# Patient Record
Sex: Female | Born: 1945
Health system: Southern US, Community
[De-identification: ages and names within clinical notes are randomized; demographics above are authoritative.]

## PROBLEM LIST (undated history)

## (undated) DIAGNOSIS — A64 Unspecified sexually transmitted disease: Secondary | ICD-10-CM

## (undated) DIAGNOSIS — Z8639 Personal history of other endocrine, nutritional and metabolic disease: Secondary | ICD-10-CM

## (undated) DIAGNOSIS — N3281 Overactive bladder: Secondary | ICD-10-CM

## (undated) DIAGNOSIS — M8580 Other specified disorders of bone density and structure, unspecified site: Secondary | ICD-10-CM

## (undated) DIAGNOSIS — E785 Hyperlipidemia, unspecified: Secondary | ICD-10-CM

## (undated) DIAGNOSIS — K219 Gastro-esophageal reflux disease without esophagitis: Secondary | ICD-10-CM

## (undated) DIAGNOSIS — J329 Chronic sinusitis, unspecified: Secondary | ICD-10-CM

## (undated) DIAGNOSIS — M5136 Other intervertebral disc degeneration, lumbar region: Secondary | ICD-10-CM

## (undated) HISTORY — DX: Other intervertebral disc degeneration, lumbar region: M51.36

## (undated) HISTORY — PX: OTHER SURGICAL HISTORY: SHX169

## (undated) HISTORY — PX: TONSILLECTOMY: SUR1361

## (undated) HISTORY — PX: CATARACT EXTRACTION: SUR2

## (undated) HISTORY — DX: Personal history of other endocrine, nutritional and metabolic disease: Z86.39

## (undated) HISTORY — DX: Chronic sinusitis, unspecified: J32.9

## (undated) HISTORY — DX: Gastro-esophageal reflux disease without esophagitis: K21.9

## (undated) HISTORY — DX: Hyperlipidemia, unspecified: E78.5

## (undated) HISTORY — DX: Other specified disorders of bone density and structure, unspecified site: M85.80

## (undated) HISTORY — PX: TUBAL LIGATION: SHX77

## (undated) HISTORY — DX: Overactive bladder: N32.81

## (undated) HISTORY — DX: Unspecified sexually transmitted disease: A64

---

## 1987-10-23 HISTORY — PX: TUBAL LIGATION: SHX77

## 1987-10-23 HISTORY — PX: OTHER SURGICAL HISTORY: SHX169

## 1998-06-03 ENCOUNTER — Other Ambulatory Visit: Admission: RE | Admit: 1998-06-03 | Discharge: 1998-06-03 | Payer: Self-pay | Admitting: Obstetrics and Gynecology

## 1999-06-20 ENCOUNTER — Other Ambulatory Visit: Admission: RE | Admit: 1999-06-20 | Discharge: 1999-06-20 | Payer: Self-pay | Admitting: Obstetrics and Gynecology

## 2000-06-20 ENCOUNTER — Other Ambulatory Visit: Admission: RE | Admit: 2000-06-20 | Discharge: 2000-06-20 | Payer: Self-pay | Admitting: Obstetrics and Gynecology

## 2002-03-25 ENCOUNTER — Other Ambulatory Visit: Admission: RE | Admit: 2002-03-25 | Discharge: 2002-03-25 | Payer: Self-pay | Admitting: Obstetrics and Gynecology

## 2004-09-27 ENCOUNTER — Other Ambulatory Visit: Admission: RE | Admit: 2004-09-27 | Discharge: 2004-09-27 | Payer: Self-pay | Admitting: Obstetrics and Gynecology

## 2005-09-28 ENCOUNTER — Other Ambulatory Visit: Admission: RE | Admit: 2005-09-28 | Discharge: 2005-09-28 | Payer: Self-pay | Admitting: Obstetrics and Gynecology

## 2006-10-09 ENCOUNTER — Other Ambulatory Visit: Admission: RE | Admit: 2006-10-09 | Discharge: 2006-10-09 | Payer: Self-pay | Admitting: Obstetrics and Gynecology

## 2009-01-06 ENCOUNTER — Ambulatory Visit: Payer: Self-pay | Admitting: Obstetrics and Gynecology

## 2009-01-06 ENCOUNTER — Other Ambulatory Visit: Admission: RE | Admit: 2009-01-06 | Discharge: 2009-01-06 | Payer: Self-pay | Admitting: Obstetrics and Gynecology

## 2009-01-06 ENCOUNTER — Encounter: Payer: Self-pay | Admitting: Obstetrics and Gynecology

## 2009-01-11 ENCOUNTER — Ambulatory Visit: Payer: Self-pay | Admitting: Obstetrics and Gynecology

## 2010-01-11 ENCOUNTER — Ambulatory Visit: Payer: Self-pay | Admitting: Obstetrics and Gynecology

## 2010-01-11 ENCOUNTER — Other Ambulatory Visit: Admission: RE | Admit: 2010-01-11 | Discharge: 2010-01-11 | Payer: Self-pay | Admitting: Obstetrics and Gynecology

## 2010-08-23 ENCOUNTER — Ambulatory Visit: Payer: Self-pay | Admitting: Cardiology

## 2011-01-15 ENCOUNTER — Encounter (INDEPENDENT_AMBULATORY_CARE_PROVIDER_SITE_OTHER): Payer: Medicare Other | Admitting: Obstetrics and Gynecology

## 2011-01-15 DIAGNOSIS — A6004 Herpesviral vulvovaginitis: Secondary | ICD-10-CM

## 2011-01-15 DIAGNOSIS — R82998 Other abnormal findings in urine: Secondary | ICD-10-CM

## 2011-01-15 DIAGNOSIS — N952 Postmenopausal atrophic vaginitis: Secondary | ICD-10-CM

## 2011-01-15 DIAGNOSIS — M949 Disorder of cartilage, unspecified: Secondary | ICD-10-CM

## 2011-01-17 ENCOUNTER — Encounter (INDEPENDENT_AMBULATORY_CARE_PROVIDER_SITE_OTHER): Payer: Medicare Other

## 2011-01-17 DIAGNOSIS — M899 Disorder of bone, unspecified: Secondary | ICD-10-CM

## 2011-01-18 ENCOUNTER — Telehealth: Payer: Self-pay | Admitting: Cardiology

## 2011-01-18 NOTE — Telephone Encounter (Signed)
PT CAME BY THIS MORNING AND WANTED DR BB TO SEE HER FOR A COLD/FLU. STATED SHE HAD ALREADY BEEN TO THE [facility] DOWN [facility] [address] AND THEY WOULD NOT SEE HER. S/E MELINDA, ADVISED PT DR BB NOT IN AND SW HER ABOUT GOING TO THE URGENT CARE. PT CALLED THIS AFTERNOON AND INSISTED THAT IT WAS YESTERDAY WHEN SHE STOPPED BY AND DR BB WAS NOT IN AND WANTED TO KNOW IF HE WAS HERE TODAY. ADVISED HER SHE WAS JUST HERE THIS MORNING AND I HAD THE CONVERSATION WITH HER. PT STATED DID NOT GO TO THE URGENT CARE. SHE WENT HOME AND WENT TO BED AND WANTS TO KNOW IF SOMEONE  WILL CALL SOMETHING IN TO THE [facility]. CHART PLACED IN YOUR BOX.

## 2011-01-18 NOTE — Telephone Encounter (Signed)
Called patient back.  Patient stated she felt so bad she didn't think she could go to urgent cared, went home and went to bed.  Patient stated she had sudden onset of symptoms on Wednesday afternoon.  Advised she needed to go to urgent care to be tested.  In the mean time, ok to use tylenol/advil prn for fever, chills, and aches.  She stated her GYN started a new medication for urinary frequency, however she was not going to continue until felt better.

## 2011-01-19 NOTE — Telephone Encounter (Signed)
Agree with plan 

## 2011-01-30 ENCOUNTER — Other Ambulatory Visit: Payer: Self-pay | Admitting: *Deleted

## 2011-01-30 DIAGNOSIS — E785 Hyperlipidemia, unspecified: Secondary | ICD-10-CM

## 2011-01-30 MED ORDER — ATORVASTATIN CALCIUM 40 MG PO TABS: 40.0000 mg | ORAL_TABLET | Freq: Every day | ORAL | Status: DC

## 2011-02-02 ENCOUNTER — Other Ambulatory Visit: Payer: Medicare Other

## 2011-08-15 ENCOUNTER — Encounter: Payer: Self-pay | Admitting: *Deleted

## 2011-08-15 DIAGNOSIS — K219 Gastro-esophageal reflux disease without esophagitis: Secondary | ICD-10-CM | POA: Insufficient documentation

## 2011-08-15 DIAGNOSIS — J329 Chronic sinusitis, unspecified: Secondary | ICD-10-CM | POA: Insufficient documentation

## 2011-08-17 ENCOUNTER — Ambulatory Visit (INDEPENDENT_AMBULATORY_CARE_PROVIDER_SITE_OTHER): Payer: Medicare Other | Admitting: *Deleted

## 2011-08-17 DIAGNOSIS — E7800 Pure hypercholesterolemia, unspecified: Secondary | ICD-10-CM

## 2011-08-17 LAB — HEPATIC FUNCTION PANEL
AST: 21 U/L (ref 0–37)
Albumin: 3.8 g/dL (ref 3.5–5.2)
Alkaline Phosphatase: 85 U/L (ref 39–117)
Bilirubin, Direct: 0.1 mg/dL (ref 0.0–0.3)
Total Protein: 6.9 g/dL (ref 6.0–8.3)

## 2011-08-17 LAB — BASIC METABOLIC PANEL
CO2: 30 mEq/L (ref 19–32)
Chloride: 106 mEq/L (ref 96–112)
Potassium: 4.2 mEq/L (ref 3.5–5.1)
Sodium: 141 mEq/L (ref 135–145)

## 2011-08-17 LAB — LIPID PANEL: Total CHOL/HDL Ratio: 2

## 2011-08-20 ENCOUNTER — Ambulatory Visit (INDEPENDENT_AMBULATORY_CARE_PROVIDER_SITE_OTHER): Payer: Medicare Other | Admitting: Cardiology

## 2011-08-20 ENCOUNTER — Encounter: Payer: Self-pay | Admitting: Cardiology

## 2011-08-20 VITALS — BP 120/70 | HR 78 | Ht 64.0 in | Wt 157.0 lb

## 2011-08-20 DIAGNOSIS — E7800 Pure hypercholesterolemia, unspecified: Secondary | ICD-10-CM

## 2011-08-20 DIAGNOSIS — K219 Gastro-esophageal reflux disease without esophagitis: Secondary | ICD-10-CM

## 2011-08-20 DIAGNOSIS — J329 Chronic sinusitis, unspecified: Secondary | ICD-10-CM

## 2011-08-20 NOTE — Progress Notes (Signed)
Sharon Robinson Date of Birth:  06/11/1946 [facility] / [facility] [address]   [address] [place], [place]  [address] [phone]           Fax   [phone]  HPI: This pleasant 65 year old woman is seen for a one-year followup office visit.  She has a past history of hypercholesterolemia.  His last visit.  She has been feeling well.  She continues on Lipitor 40 mg daily.  She's not having myalgias or side effects from the Lipitor.  She does not have any history of coronary disease.  She had a normal stress Cardiolite in 2007  Current Outpatient Prescriptions  Medication Sig Dispense Refill  . atorvastatin (LIPITOR) 40 MG tablet Take 1 tablet (40 mg total) by mouth daily.  30 tablet  11  . GuaiFENesin (MUCINEX PO) Take by mouth. Takes prn       . montelukast (SINGULAIR) 10 MG tablet Take 10 mg by mouth at bedtime. As directed       . ValACYclovir HCl (VALTREX PO) Take by mouth daily. 500 daily        No Known Allergies  Patient Active Problem List  Diagnoses  . Sinusitis  . GERD (gastroesophageal reflux disease)    History  Smoking status  . Current Everyday Smoker -- 0.5 packs/day  . Types: Cigarettes  Smokeless tobacco  . Not on file    History  Alcohol Use     Family History  Problem Relation Age of Onset  . Heart attack Mother 31  . Cancer Father 27    colon    Review of Systems: The patient denies any heat or cold intolerance.  No weight gain or weight loss.  The patient denies headaches or blurry vision.  There is no cough or sputum production.  The patient denies dizziness.  There is no hematuria or hematochezia.  The patient denies any muscle aches or arthritis.  The patient denies any rash.  The patient denies frequent falling or instability.  There is no history of depression or anxiety.  All other systems were reviewed and are negative.   Physical Exam: Filed Vitals:   08/20/11 1627  BP: 120/70  Pulse: 78   general appearance  reveals a well-developed, well-nourished woman in no distress.The head and neck exam reveals pupils equal and reactive.  Extraocular movements are full.  There is no scleral icterus.  The mouth and pharynx are normal.  The neck is supple.  The carotids reveal no bruits.  The jugular venous pressure is normal.  The  thyroid is not enlarged.  There is no lymphadenopathy.  The chest is clear to percussion and auscultation.  There are no rales or rhonchi.  Expansion of the chest is symmetrical.  The precordium is quiet.  The first heart sound is normal.  The second heart sound is physiologically split.  There is no murmur gallop rub or click.  There is no abnormal lift or heave.  The abdomen is soft and nontender.  The bowel sounds are normal.  The liver and spleen are not enlarged.  There are no abdominal masses.  There are no abdominal bruits.  Extremities reveal good pedal pulses.  There is no phlebitis or edema.  There is no cyanosis or clubbing.  Strength is normal and symmetrical in all extremities.  There is no lateralizing weakness.  There are no sensory deficits.  The skin is warm and dry.  There is no rash.  Assessment / Plan:  Continue same medication.  Recheck in one year for office visit and fasting lab work.

## 2011-08-20 NOTE — Patient Instructions (Addendum)
Your physician wants you to follow-up in: 1 year  You will receive a reminder letter in the mail two months in advance. If you don't receive a letter, please call our office to schedule the follow-up appointment.  Your physician recommends that you continue on your current medications as directed. Please refer to the Current Medication list given to you today.  

## 2011-08-20 NOTE — Assessment & Plan Note (Signed)
No recent symptoms of GERD or dyspepsia

## 2011-08-20 NOTE — Assessment & Plan Note (Signed)
I reviewed her recent blood work, which is excellent.  She's not having any side effects from statin therapy.  She will continue same dose of Lipitor

## 2011-08-20 NOTE — Assessment & Plan Note (Signed)
She has had a head cold for the past several weeks.  She has been taking Mucinex once a day.  She's not having any purulent sputum or fever.  She does not need any antibiotic at this time.

## 2011-09-17 ENCOUNTER — Telehealth: Payer: Self-pay | Admitting: Cardiology

## 2011-09-17 DIAGNOSIS — E785 Hyperlipidemia, unspecified: Secondary | ICD-10-CM

## 2011-09-17 MED ORDER — ATORVASTATIN CALCIUM 40 MG PO TABS: 40.0000 mg | ORAL_TABLET | Freq: Every day | ORAL | Status: DC

## 2011-09-17 NOTE — Telephone Encounter (Signed)
Advised ok to change to generic Lipitor and sent Rx to [facility].  Do you want to check her labs before September?

## 2011-09-17 NOTE — Telephone Encounter (Signed)
New Msg: Pt calling regarding increase price in lipitor according to pt insurance. [place]ther substitutes for Lipitor are crestor or atorvastatin.  Alternate number: [phone]

## 2011-09-17 NOTE — Telephone Encounter (Signed)
Not necessary to recheck her labs before September since she is still basically on the same medication except now generic.

## 2011-12-26 ENCOUNTER — Encounter: Payer: Self-pay | Admitting: Cardiology

## 2011-12-27 ENCOUNTER — Encounter: Payer: Self-pay | Admitting: Obstetrics and Gynecology

## 2012-01-15 ENCOUNTER — Encounter: Payer: Self-pay | Admitting: Gynecology

## 2012-01-15 DIAGNOSIS — M8580 Other specified disorders of bone density and structure, unspecified site: Secondary | ICD-10-CM | POA: Insufficient documentation

## 2012-01-15 DIAGNOSIS — N3281 Overactive bladder: Secondary | ICD-10-CM | POA: Insufficient documentation

## 2012-01-15 DIAGNOSIS — A64 Unspecified sexually transmitted disease: Secondary | ICD-10-CM | POA: Insufficient documentation

## 2012-01-16 ENCOUNTER — Encounter: Payer: Medicare Other | Admitting: Obstetrics and Gynecology

## 2012-01-23 ENCOUNTER — Other Ambulatory Visit (HOSPITAL_COMMUNITY)
Admission: RE | Admit: 2012-01-23 | Discharge: 2012-01-23 | Disposition: A | Discharge status: Home / Self Care | Payer: Medicare Other | Source: Ambulatory Visit | Attending: Obstetrics and Gynecology | Admitting: Obstetrics and Gynecology

## 2012-01-23 ENCOUNTER — Encounter: Payer: Self-pay | Admitting: Obstetrics and Gynecology

## 2012-01-23 ENCOUNTER — Ambulatory Visit (INDEPENDENT_AMBULATORY_CARE_PROVIDER_SITE_OTHER): Payer: Medicare Other | Admitting: Obstetrics and Gynecology

## 2012-01-23 VITALS — BP 120/66 | Ht 64.5 in | Wt 161.0 lb

## 2012-01-23 DIAGNOSIS — Z124 Encounter for screening for malignant neoplasm of cervix: Secondary | ICD-10-CM

## 2012-01-23 DIAGNOSIS — M8580 Other specified disorders of bone density and structure, unspecified site: Secondary | ICD-10-CM

## 2012-01-23 DIAGNOSIS — B009 Herpesviral infection, unspecified: Secondary | ICD-10-CM

## 2012-01-23 DIAGNOSIS — M949 Disorder of cartilage, unspecified: Secondary | ICD-10-CM

## 2012-01-23 DIAGNOSIS — R350 Frequency of micturition: Secondary | ICD-10-CM

## 2012-01-23 DIAGNOSIS — R3915 Urgency of urination: Secondary | ICD-10-CM

## 2012-01-23 DIAGNOSIS — Z01419 Encounter for gynecological examination (general) (routine) without abnormal findings: Secondary | ICD-10-CM | POA: Insufficient documentation

## 2012-01-23 MED ORDER — VALACYCLOVIR HCL 500 MG PO TABS: 500.0000 mg | ORAL_TABLET | Freq: Every day | ORAL | Status: AC

## 2012-01-23 NOTE — Progress Notes (Signed)
Patient came to see me today for further followup. The first thing we discussed with her osteopenia. We had  asked her to return to discuss this previously but she had not. She had lost additional bone in both her spine and hip. Her 10 year fracture risk at the hip was borderline at 2.9%. She is not taking calcium or vitamin D. She previously taken D for a low D level. She is still smoker although she says she does not inhale. She is exercising. She does have atrophic vaginitis but is not bothered by it and is not sexually active. She is having no vaginal bleeding and no pelvic pain. She is taking Valtrex daily and since she started she has had no herpetic reoccurrences. We gave her samples last year of Toviaz but she had side effects and stopped them. She feels she's fine without a different medication. She sees Dr. Patty Sermons who does her lab work. She has had no fractures and is taken several falls. Patient had a normal colonoscopy several years ago.  ROS: 12 system review done. Pertinent positives above.  Physical examination:  Kim gardner present HEENT within normal limits. Neck: Thyroid not large. No masses. Supraclavicular nodes: not enlarged. Breasts: Examined in both sitting and lying  position. No skin changes and no masses. Abdomen: Soft no guarding rebound or masses or hernia. Pelvic: External: Within normal limits. BUS: Within normal limits. Vaginal:within normal limits. Poor  estrogen effect. No evidence of cystocele rectocele or enterocele. Cervix: clean. Uterus: Normal size and shape. Adnexa: No masses. Rectovaginal exam: Confirmatory and negative. Extremities: Within normal limits.  Assessment: #1. Osteopenia #2. Detrussor instability #3. HSV #4. Atrophic vaginitis  Plan: We discussed medication for her osteopenia. She declined. She will make sure she's getting adequate calcium in her diet. We rechecked a vitamin D level. I told her I suspected she would need medication. She will  continue her Valtrex 500 mg daily. We will continue to watch her detrussor instability and atrophic vaginitis.

## 2012-01-24 ENCOUNTER — Other Ambulatory Visit: Payer: Self-pay

## 2012-01-29 ENCOUNTER — Telehealth: Payer: Self-pay

## 2012-01-29 DIAGNOSIS — R350 Frequency of micturition: Secondary | ICD-10-CM

## 2012-01-29 NOTE — Telephone Encounter (Signed)
PT. NOTIFIED TO RETURN TO LEAVE A URINE AGAIN DUE ALL URINE LEAKED OUT IN TRANSIT TO LAB. NO CHARGE PER PAT.

## 2012-01-30 ENCOUNTER — Other Ambulatory Visit: Payer: Medicare Other

## 2012-01-30 DIAGNOSIS — R350 Frequency of micturition: Secondary | ICD-10-CM

## 2012-01-31 LAB — URINALYSIS W MICROSCOPIC + REFLEX CULTURE
Bilirubin Urine: NEGATIVE
Crystals: NONE SEEN
Nitrite: NEGATIVE
Specific Gravity, Urine: 1.021 (ref 1.005–1.030)
Squamous Epithelial / LPF: NONE SEEN
Urobilinogen, UA: 0.2 mg/dL (ref 0.0–1.0)

## 2012-05-30 ENCOUNTER — Other Ambulatory Visit: Payer: Medicare Other

## 2012-08-02 ENCOUNTER — Other Ambulatory Visit: Payer: Self-pay | Admitting: Cardiology

## 2012-08-20 ENCOUNTER — Other Ambulatory Visit: Payer: Self-pay | Admitting: *Deleted

## 2012-08-20 DIAGNOSIS — E7800 Pure hypercholesterolemia, unspecified: Secondary | ICD-10-CM

## 2012-08-26 ENCOUNTER — Other Ambulatory Visit (INDEPENDENT_AMBULATORY_CARE_PROVIDER_SITE_OTHER): Payer: Medicare Other

## 2012-08-26 ENCOUNTER — Encounter: Payer: Self-pay | Admitting: Cardiology

## 2012-08-26 ENCOUNTER — Ambulatory Visit (INDEPENDENT_AMBULATORY_CARE_PROVIDER_SITE_OTHER): Payer: Medicare Other | Admitting: Cardiology

## 2012-08-26 VITALS — BP 133/64 | HR 61 | Resp 18 | Ht 63.0 in | Wt 162.0 lb

## 2012-08-26 DIAGNOSIS — Z720 Tobacco use: Secondary | ICD-10-CM | POA: Insufficient documentation

## 2012-08-26 DIAGNOSIS — E7800 Pure hypercholesterolemia, unspecified: Secondary | ICD-10-CM

## 2012-08-26 DIAGNOSIS — K219 Gastro-esophageal reflux disease without esophagitis: Secondary | ICD-10-CM

## 2012-08-26 DIAGNOSIS — F17200 Nicotine dependence, unspecified, uncomplicated: Secondary | ICD-10-CM

## 2012-08-26 LAB — BASIC METABOLIC PANEL
BUN: 17 mg/dL (ref 6–23)
Calcium: 9.3 mg/dL (ref 8.4–10.5)
Chloride: 105 mEq/L (ref 96–112)
Creatinine, Ser: 0.8 mg/dL (ref 0.4–1.2)

## 2012-08-26 LAB — CBC WITH DIFFERENTIAL/PLATELET
Basophils Relative: 0.5 % (ref 0.0–3.0)
Eosinophils Absolute: 0.1 10*3/uL (ref 0.0–0.7)
HCT: 41.8 % (ref 36.0–46.0)
Hemoglobin: 13.5 g/dL (ref 12.0–15.0)
Lymphocytes Relative: 32.3 % (ref 12.0–46.0)
Lymphs Abs: 2.4 10*3/uL (ref 0.7–4.0)
MCHC: 32.3 g/dL (ref 30.0–36.0)
Monocytes Relative: 6.5 % (ref 3.0–12.0)
Neutro Abs: 4.5 10*3/uL (ref 1.4–7.7)
RBC: 3.91 Mil/uL (ref 3.87–5.11)

## 2012-08-26 LAB — HEPATIC FUNCTION PANEL
AST: 20 U/L (ref 0–37)
Albumin: 4.1 g/dL (ref 3.5–5.2)
Alkaline Phosphatase: 75 U/L (ref 39–117)
Total Protein: 7.1 g/dL (ref 6.0–8.3)

## 2012-08-26 LAB — LIPID PANEL
Cholesterol: 135 mg/dL (ref 0–200)
LDL Cholesterol: 69 mg/dL (ref 0–99)
Triglycerides: 82 mg/dL (ref 0.0–149.0)

## 2012-08-26 NOTE — Patient Instructions (Addendum)
Your physician recommends that you continue on your current medications as directed. Please refer to the Current Medication list given to you today.  Your physician wants you to follow-up in: 1 year with fasting labs You will receive a reminder letter in the mail two months in advance. If you don't receive a letter, please call our office to schedule the follow-up appointment.   Will obtain labs today and call you with the results  (lp/bmet/hfp/cbc)

## 2012-08-26 NOTE — Assessment & Plan Note (Signed)
The patient continues to smoke about one half pack cigarettes a day.  We talked about the importance of quitting smoking.

## 2012-08-26 NOTE — Progress Notes (Signed)
Quick Note:  Please report to patient. The recent labs are stable. Continue same medication and careful diet. ______ 

## 2012-08-26 NOTE — Progress Notes (Signed)
Sharon Robinson Date of Birth:  [birth year] [facility] [address] [place], [place]  [address] [phone]  Fax   [phone]  HPI: This pleasant 66 year old woman is seen for a one-year followup office visit. She has a past history of hypercholesterolemia. His last visit. She has been feeling well. She continues on Lipitor 40 mg daily. She's not having myalgias or side effects from the Lipitor. She does not have any history of coronary disease. She had a normal stress Cardiolite in 2007.  The patient has not been experiencing any substernal chest pain.  She has had a muscle pull involving her left scapula and has been taking Aleve for that.   Current Outpatient Prescriptions  Medication Sig Dispense Refill  . aspirin 81 MG tablet Take 81 mg by mouth as needed.       . GuaiFENesin (MUCINEX PO) Take by mouth. Takes prn      . LIPITOR 40 MG tablet TAKE 1 TABLET ONCE DAILY.  30 tablet  6  . montelukast (SINGULAIR) 10 MG tablet Take 10 mg by mouth at bedtime. As directed       . naproxen sodium (ANAPROX) 220 MG tablet Take 220 mg by mouth 2 (two) times daily with a meal.      . valACYclovir (VALTREX) 500 MG tablet Take 1 tablet (500 mg total) by mouth daily.  30 tablet  12    No Known Allergies  Patient Active Problem List  Diagnosis  . Sinusitis  . GERD (gastroesophageal reflux disease)  . Hypercholesterolemia  . DI (detrusor instability)  . STD (sexually transmitted disease)  . Osteopenia    History  Smoking status  . Current Every Day Smoker -- 0.5 packs/day  . Types: Cigarettes  Smokeless tobacco  . Not on file    History  Alcohol Use  . Yes    Comment: rare    Family History  Problem Relation Age of Onset  . Heart attack Mother 65  . Cancer Father 68    colon    Review of Systems: The patient denies any heat or cold intolerance.  No weight gain or weight loss.  The patient denies headaches or blurry vision.  There is no cough or sputum  production.  The patient denies dizziness.  There is no hematuria or hematochezia.  The patient denies any muscle aches or arthritis.  The patient denies any rash.  The patient denies frequent falling or instability.  There is no history of depression or anxiety.  All other systems were reviewed and are negative.   Physical Exam: Filed Vitals:   08/26/12 0921  BP: 133/64  Pulse: 61  Resp: 18   general appearance reveals a well-developed well-nourished woman in no distress.The head and neck exam reveals pupils equal and reactive.  Extraocular movements are full.  There is no scleral icterus.  The mouth and pharynx are normal.  The neck is supple.  The carotids reveal no bruits.  The jugular venous pressure is normal.  The  thyroid is not enlarged.  There is no lymphadenopathy.  The chest is clear to percussion and auscultation.  There are no rales or rhonchi.  Expansion of the chest is symmetrical.  The precordium is quiet.  The first heart sound is normal.  The second heart sound is physiologically split.  There is no murmur gallop rub or click.  There is no abnormal lift or heave.  The abdomen is soft and nontender.  The bowel sounds  are normal.  The liver and spleen are not enlarged.  There are no abdominal masses.  There are no abdominal bruits.  Extremities reveal good pedal pulses.  There is no phlebitis or edema.  There is no cyanosis or clubbing.  Strength is normal and symmetrical in all extremities.  There is no lateralizing weakness.  There are no sensory deficits.  The skin is warm and dry.  There is no rash.   EKG today shows sinus bradycardia and otherwise within normal limits.   Assessment / Plan: Continue same medication.  Recheck in one year for followup office visit lipid panel hepatic function panel and basal metabolic panel.  She is planning to go on a trip to [place] next spring.

## 2012-08-26 NOTE — Assessment & Plan Note (Signed)
The patient has not been having any worsening of her symptoms of GERD.

## 2012-08-26 NOTE — Assessment & Plan Note (Signed)
The patient remains on Lipitor 40 mg daily.  We're checking blood work today.  She's not having any side effects from the statin therapy.  Her weight is up 5 pounds since last year.

## 2012-08-27 ENCOUNTER — Telehealth: Payer: Self-pay | Admitting: *Deleted

## 2012-08-27 NOTE — Telephone Encounter (Signed)
Mailed copy of labs and left message to call if any questions  

## 2012-08-27 NOTE — Telephone Encounter (Signed)
Message copied by Burnell Blanks on Wed Aug 27, 2012  9:52 AM ------      Message from: Cassell Clement      Created: Tue Aug 26, 2012  9:19 PM       Please report to patient.  The recent labs are stable. Continue same medication and careful diet.

## 2012-10-30 ENCOUNTER — Telehealth: Payer: Self-pay | Admitting: Cardiology

## 2012-10-30 NOTE — Telephone Encounter (Signed)
Pt needs referral to [facility] surgical for her back, [place] fax [phone]  pls call pt if any questions [phone]

## 2012-10-30 NOTE — Telephone Encounter (Signed)
Left message to call back  

## 2012-10-31 NOTE — Telephone Encounter (Signed)
Pt rtn call until [phone] 1200p, after 100p  Until 300p then at [phone]

## 2012-10-31 NOTE — Telephone Encounter (Signed)
Discussed patient and will have her see  Dr. Patty Sermons for ov Monday so documentation can be made for referral. Patient having pain under her shoulder blade in back

## 2012-11-03 ENCOUNTER — Ambulatory Visit (INDEPENDENT_AMBULATORY_CARE_PROVIDER_SITE_OTHER)
Admission: RE | Admit: 2012-11-03 | Discharge: 2012-11-03 | Disposition: A | Discharge status: Home / Self Care | Payer: Medicare Other | Source: Ambulatory Visit | Attending: Cardiology | Admitting: Cardiology

## 2012-11-03 ENCOUNTER — Ambulatory Visit (INDEPENDENT_AMBULATORY_CARE_PROVIDER_SITE_OTHER): Payer: Medicare Other | Admitting: Cardiology

## 2012-11-03 ENCOUNTER — Encounter: Payer: Self-pay | Admitting: Cardiology

## 2012-11-03 VITALS — BP 130/72 | HR 78 | Resp 18 | Ht 64.0 in | Wt 160.1 lb

## 2012-11-03 DIAGNOSIS — Z720 Tobacco use: Secondary | ICD-10-CM

## 2012-11-03 DIAGNOSIS — F17200 Nicotine dependence, unspecified, uncomplicated: Secondary | ICD-10-CM

## 2012-11-03 DIAGNOSIS — R079 Chest pain, unspecified: Secondary | ICD-10-CM

## 2012-11-03 NOTE — Progress Notes (Signed)
Kenton Kingfisher Date of Birth:  Feb 28, 1946 [facility] [address] [place], [place]  [address] [phone]  Fax   [phone]  HPI: This pleasant 67 year old woman is seen for a work in office visit.  She comes in because of persistent discomfort in the region of the left posterior paraspinal thoracic area.  She had mentioned this at her previous visit in November and it has not improved. She has a past history of hypercholesterolemia.  She continues on Lipitor 40 mg daily. She's not having myalgias or side effects from the Lipitor. She does not have any history of coronary disease. She had a normal stress Cardiolite in 2007. The patient has not been experiencing any substernal chest pain.    Current Outpatient Prescriptions  Medication Sig Dispense Refill  . aspirin 81 MG tablet Take 81 mg by mouth as needed.       . GuaiFENesin (MUCINEX PO) Take by mouth. Takes prn      . LIPITOR 40 MG tablet TAKE 1 TABLET ONCE DAILY.  30 tablet  6  . montelukast (SINGULAIR) 10 MG tablet Take 10 mg by mouth at bedtime. As directed       . naproxen sodium (ANAPROX) 220 MG tablet Take 220 mg by mouth 2 (two) times daily with a meal.      . valACYclovir (VALTREX) 500 MG tablet Take 1 tablet (500 mg total) by mouth daily.  30 tablet  12    No Known Allergies  Patient Active Problem List  Diagnosis  . Sinusitis  . GERD (gastroesophageal reflux disease)  . Hypercholesterolemia  . DI (detrusor instability)  . STD (sexually transmitted disease)  . Osteopenia  . Tobacco abuse  . Chest pain at rest    History  Smoking status  . Current Every Day Smoker -- 0.5 packs/day  . Types: Cigarettes  Smokeless tobacco  . Not on file    History  Alcohol Use  . Yes    Comment: rare    Family History  Problem Relation Age of Onset  . Heart attack Mother 84  . Cancer Father 45    colon    Review of Systems: The patient denies any heat or cold intolerance.  No weight gain  or weight loss.  The patient denies headaches or blurry vision.  There is no cough or sputum production.  The patient denies dizziness.  There is no hematuria or hematochezia.  The patient denies any muscle aches or arthritis.  The patient denies any rash.  The patient denies frequent falling or instability.  There is no history of depression or anxiety.  All other systems were reviewed and are negative.   Physical Exam: Filed Vitals:   11/03/12 1426  BP: 130/72  Pulse: 78  Resp: 18   the general appearance reveals a well-developed well-nourished woman in no distress.The head and neck exam reveals pupils equal and reactive.  Extraocular movements are full.  There is no scleral icterus.  The mouth and pharynx are normal.  The neck is supple.  The carotids reveal no bruits.  The jugular venous pressure is normal.  The  thyroid is not enlarged.  There is no lymphadenopathy.  The chest is clear to percussion and auscultation.  There are no rales or rhonchi.  Expansion of the chest is symmetrical.  The precordium is quiet.  The first heart sound is normal.  The second heart sound is physiologically split.  There is no murmur gallop rub or  click.  There is no abnormal lift or heave.  The abdomen is soft and nontender.  The bowel sounds are normal.  The liver and spleen are not enlarged.  There are no abdominal masses.  There are no abdominal bruits.  Extremities reveal good pedal pulses.  There is no phlebitis or edema.  There is no cyanosis or clubbing.  Strength is normal and symmetrical in all extremities.  There is no lateralizing weakness.  There are no sensory deficits.  The skin is warm and dry.  There is no rash.  Range of motion of the left shoulder is normal.  Palpation of the region of the left thoracic spine and of the left scapular does not reveal any point tenderness.     Assessment / Plan: We will proceed with a MRI of the thoracic spine.  Depending on results we may wish to refer her to  neurosurgery for further evaluation

## 2012-11-03 NOTE — Patient Instructions (Addendum)
Will have you go for a chest xray across from [facility] in the [place] [facility]   Your physician recommends that you continue on your current medications as directed. Please refer to the Current Medication list given to you today.

## 2012-11-03 NOTE — Assessment & Plan Note (Signed)
Patient states that she does not inhale.  She uses about 10 cigarettes a day.  Again we encouraged her to quit altogether

## 2012-11-03 NOTE — Assessment & Plan Note (Signed)
The patient indicates that her discomfort is in the region of the left parasternal thoracic area and medial to her scapula.  Visual examination of the area does not reveal any evidence of herpes zoster or skin rash.  The patient denies any history of injury to the area.  There is no radiation down into the legs.  The discomfort is not pleuritic.  The patient denies any paresthesias of her arms.  She does not have any limitation of left shoulder motion.  We sent her for a chest x-ray today which showed degenerative spine disease but no pulmonary problem.  The patient is a long-term smoker.

## 2012-11-05 ENCOUNTER — Telehealth: Payer: Self-pay | Admitting: *Deleted

## 2012-11-05 DIAGNOSIS — M5414 Radiculopathy, thoracic region: Secondary | ICD-10-CM

## 2012-11-05 NOTE — Telephone Encounter (Signed)
Message copied by Burnell Blanks on Wed Nov 05, 2012  8:20 AM ------      Message from: Cassell Clement      Created: Mon Nov 03, 2012  4:37 PM       Please report.  The chest x-ray shows that the lungs are clear and the heart size is normal.  There are degenerative changes seen in the spine.  Because of her left posterior parasternal thoracic pain, I want her to have an MRI of the thoracic spine.

## 2012-11-05 NOTE — Telephone Encounter (Signed)
With contrast

## 2012-11-05 NOTE — Telephone Encounter (Signed)
Advised patient. Will forward to  Dr. Patty Sermons to see if MRI needs to be with or without contrast

## 2012-11-05 NOTE — Telephone Encounter (Signed)
Advised patient of xray results and scheduled MRI.  Patient scheduled for MRI 09/11/13 at 3:00, advised

## 2012-11-11 ENCOUNTER — Ambulatory Visit
Admission: RE | Admit: 2012-11-11 | Discharge: 2012-11-11 | Disposition: A | Discharge status: Home / Self Care | Payer: Medicare Other | Source: Ambulatory Visit | Attending: Cardiology | Admitting: Cardiology

## 2012-11-11 DIAGNOSIS — M5414 Radiculopathy, thoracic region: Secondary | ICD-10-CM

## 2012-11-17 ENCOUNTER — Telehealth: Payer: Self-pay | Admitting: *Deleted

## 2012-11-17 DIAGNOSIS — F17200 Nicotine dependence, unspecified, uncomplicated: Secondary | ICD-10-CM

## 2012-11-17 DIAGNOSIS — R079 Chest pain, unspecified: Secondary | ICD-10-CM

## 2012-11-17 NOTE — Telephone Encounter (Signed)
Since she is having persistent posterior left chest discomfort and is a smoker, get a CT of chest with contrast. If that is negative we may suggest chiropractor.

## 2012-11-17 NOTE — Telephone Encounter (Signed)
Message copied by Burnell Blanks on Mon Nov 17, 2012  5:36 PM ------      Message from: Cassell Clement      Created: Tue Nov 11, 2012  5:36 PM       Please report.  Slight scoliosis otherwise normal MRI.

## 2012-11-17 NOTE — Telephone Encounter (Signed)
Advised patient   Wants to know what to do now, will forward to  Dr. Patty Sermons

## 2012-11-18 ENCOUNTER — Other Ambulatory Visit: Payer: Self-pay | Admitting: *Deleted

## 2012-11-18 DIAGNOSIS — Z79899 Other long term (current) drug therapy: Secondary | ICD-10-CM

## 2012-11-18 NOTE — Telephone Encounter (Signed)
Left message to call back  

## 2012-11-18 NOTE — Telephone Encounter (Signed)
Pt's cell # [phone] pt rtn your call

## 2012-11-18 NOTE — Telephone Encounter (Signed)
Advised patient and will send to scheduling

## 2012-11-20 ENCOUNTER — Other Ambulatory Visit (INDEPENDENT_AMBULATORY_CARE_PROVIDER_SITE_OTHER): Payer: Medicare Other

## 2012-11-20 DIAGNOSIS — Z79899 Other long term (current) drug therapy: Secondary | ICD-10-CM

## 2012-11-20 LAB — BASIC METABOLIC PANEL
CO2: 28 mEq/L (ref 19–32)
Calcium: 9 mg/dL (ref 8.4–10.5)
Creatinine, Ser: 0.7 mg/dL (ref 0.4–1.2)
GFR: 85.45 mL/min (ref >60.00)
Glucose, Bld: 117 mg/dL — ABNORMAL HIGH (ref 70–99)

## 2012-11-20 NOTE — Progress Notes (Signed)
Quick Note:  Please report to patient. The recent labs are stable. Continue same medication and careful diet.BS still slightly high. Watch carbs. ______

## 2012-11-21 ENCOUNTER — Telehealth: Payer: Self-pay | Admitting: *Deleted

## 2012-11-21 ENCOUNTER — Ambulatory Visit (INDEPENDENT_AMBULATORY_CARE_PROVIDER_SITE_OTHER)
Admission: RE | Admit: 2012-11-21 | Discharge: 2012-11-21 | Disposition: A | Discharge status: Home / Self Care | Payer: Medicare Other | Source: Ambulatory Visit | Attending: Cardiology | Admitting: Cardiology

## 2012-11-21 DIAGNOSIS — R079 Chest pain, unspecified: Secondary | ICD-10-CM

## 2012-11-21 DIAGNOSIS — F17200 Nicotine dependence, unspecified, uncomplicated: Secondary | ICD-10-CM

## 2012-11-21 MED ORDER — IOHEXOL 300 MG/ML  SOLN
80.0000 mL | Freq: Once | INTRAMUSCULAR | Status: AC | PRN
  Administered 2012-11-21: 80 mL via INTRAVENOUS

## 2012-11-21 NOTE — Telephone Encounter (Signed)
Advised of labs and ct scan

## 2012-11-21 NOTE — Telephone Encounter (Signed)
Message copied by Burnell Blanks on Fri Nov 21, 2012  5:53 PM ------      Message from: Cassell Clement      Created: Thu Nov 20, 2012  9:11 PM       Please report to patient.  The recent labs are stable. Continue same medication and careful diet.BS still slightly high.  Watch carbs.

## 2012-11-21 NOTE — Telephone Encounter (Signed)
Message copied by Burnell Blanks on Fri Nov 21, 2012  5:53 PM ------      Message from: Cassell Clement      Created: Fri Nov 21, 2012  4:26 PM       Please report.  The CT scan of the chest is normal.  No evidence of cancer.  The lungs are clear.  Bony structures are all okay.  No cause for the left-sided chest pain found.  Would continue to use mild analgesics as necessary.

## 2013-01-01 ENCOUNTER — Encounter: Payer: Self-pay | Admitting: Gynecology

## 2013-01-14 ENCOUNTER — Encounter: Payer: Self-pay | Admitting: Cardiology

## 2013-02-02 ENCOUNTER — Other Ambulatory Visit: Payer: Self-pay

## 2013-02-02 MED ORDER — VALACYCLOVIR HCL 500 MG PO TABS: 500.0000 mg | ORAL_TABLET | Freq: Every day | ORAL | Status: DC

## 2013-02-20 ENCOUNTER — Other Ambulatory Visit: Payer: Self-pay | Admitting: Gynecology

## 2013-02-20 ENCOUNTER — Other Ambulatory Visit: Payer: Self-pay

## 2013-02-20 MED ORDER — VALACYCLOVIR HCL 500 MG PO TABS: 500.0000 mg | ORAL_TABLET | Freq: Every day | ORAL | Status: DC

## 2013-02-23 ENCOUNTER — Other Ambulatory Visit: Payer: Self-pay | Admitting: *Deleted

## 2013-02-23 MED ORDER — ATORVASTATIN CALCIUM 40 MG PO TABS: 40.0000 mg | ORAL_TABLET | Freq: Every day | ORAL | Status: DC

## 2013-04-16 ENCOUNTER — Encounter: Payer: Self-pay | Admitting: Gynecology

## 2013-04-16 ENCOUNTER — Ambulatory Visit (INDEPENDENT_AMBULATORY_CARE_PROVIDER_SITE_OTHER): Payer: Medicare Other | Admitting: Gynecology

## 2013-04-16 VITALS — BP 134/70 | Ht 64.0 in | Wt 167.0 lb

## 2013-04-16 DIAGNOSIS — N3281 Overactive bladder: Secondary | ICD-10-CM | POA: Insufficient documentation

## 2013-04-16 DIAGNOSIS — M8580 Other specified disorders of bone density and structure, unspecified site: Secondary | ICD-10-CM

## 2013-04-16 DIAGNOSIS — B009 Herpesviral infection, unspecified: Secondary | ICD-10-CM

## 2013-04-16 DIAGNOSIS — N318 Other neuromuscular dysfunction of bladder: Secondary | ICD-10-CM

## 2013-04-16 DIAGNOSIS — M899 Disorder of bone, unspecified: Secondary | ICD-10-CM

## 2013-04-16 MED ORDER — VALACYCLOVIR HCL 500 MG PO TABS: 500.0000 mg | ORAL_TABLET | Freq: Two times a day (BID) | ORAL | Status: DC

## 2013-04-16 MED ORDER — OXYBUTYNIN 3.9 MG/24HR TD PTTW: 1.0000 | MEDICATED_PATCH | TRANSDERMAL | Status: DC

## 2013-04-16 MED ORDER — VALACYCLOVIR HCL 500 MG PO TABS: 500.0000 mg | ORAL_TABLET | Freq: Every day | ORAL | Status: DC

## 2013-04-16 NOTE — Progress Notes (Addendum)
Sharon Robinson 12-12-1945 [id number]   History:    67 y.o.  For followup. Patient has a history of osteopenia and states that she is now taking her calcium and vitamin D. Her last bone density study was in 2010 with the lowest T score of -1.7 at the [facility]. Her frax  Analysis  based on her right hip score of t -1.5 indicated that she did not need any additional treatment. Patient also has history of HSV 2 and has been on suppressive therapy of Valtrex 500 mg daily. She has had history of atrophic vaginitis but has not been bothersome and she has not requested any treatment. She still smokes despite having been counseled in the past. She has had a history of overactive bladder in the past and had been placed on copious but eventually stopped. She denies any incontinence but does urinate frequently and at times gets up at night although she drinks a lot of tea   as well. Her last mammogram was March of this year which was normal but was dense. Next year she will need a 3-D mammogram. It has been over 7 years since her last colonoscopy and she will make arrangements to get one done. Her father had a history of colon cancer. She was complaining today of a nodularity noticed at the base of her second digit of the right hand and slightly tender. Patient in 2007 was treated for vitamin D deficiency.   Past medical history,surgical history, family history and social history were all reviewed and documented in the EPIC chart.  Gynecologic History No LMP recorded. Patient is postmenopausal. Contraception: post menopausal status Last Pap: 2013. Results were: normal Last mammogram: 2014. Results were: normal but dense  Obstetric History OB History   Grav Para Term Preterm Abortions TAB SAB Ect Mult Living   0                ROS: A ROS was performed and pertinent positives and negatives are included in the history.  GENERAL: No fevers or chills. HEENT: No change in vision, no earache, sore throat or sinus  congestion. NECK: No pain or stiffness. CARDIOVASCULAR: No chest pain or pressure. No palpitations. PULMONARY: No shortness of breath, cough or wheeze. GASTROINTESTINAL: No abdominal pain, nausea, vomiting or diarrhea, melena or bright red blood per rectum. GENITOURINARY: No urinary frequency, urgency, hesitancy or dysuria. MUSCULOSKELETAL: No joint or muscle pain, no back pain, no recent trauma. DERMATOLOGIC: No rash, no itching, no lesions. ENDOCRINE: No polyuria, polydipsia, no heat or cold intolerance. No recent change in weight. HEMATOLOGICAL: No anemia or easy bruising or bleeding. NEUROLOGIC: No headache, seizures, numbness, tingling or weakness. PSYCHIATRIC: No depression, no loss of interest in normal activity or change in sleep pattern.     Exam: chaperone present  BP 134/70  Ht 5\' 4"  (1.626 m)  Wt 167 lb (75.751 kg)  BMI 28.65 kg/m2  Body mass index is 28.65 kg/(m^2).  General appearance : Well developed well nourished female. No acute distress HEENT: Neck supple, trachea midline, no carotid bruits, no thyroidmegaly Lungs: Clear to auscultation, no rhonchi or wheezes, or rib retractions  Heart: Regular rate and rhythm, no murmurs or gallops Breast:Examined in sitting and supine position were symmetrical in appearance, no palpable masses or tenderness,  no skin retraction, no nipple inversion, no nipple discharge, no skin discoloration, no axillary or supraclavicular lymphadenopathy Abdomen: no palpable masses or tenderness, no rebound or guarding Extremities: no edema or skin discoloration or tenderness  Pelvic:  Bartholin, Urethra, Skene Glands: Within normal limits             Vagina: No gross lesions or discharge, atrophic changes  Cervix: No gross lesions or discharge  Uterus  anteverted, normal size, shape and consistency, non-tender and mobile  Adnexa  Without masses or tenderness  Anus and perineum  normal   Rectovaginal  normal sphincter tone without palpated masses or  tenderness             Hemoccult course will be provided     Assessment/Plan:  67 y.o. female is overdue for her colonoscopy and was reminded to schedule accordingly. She was given Hemoccult card system at the office for testing. She was given prescription refill for Valtrex 500 mg to take 1 by mouth daily for her recurrent HSV. She will schedule her bone density study here in the office in the next few weeks. We discussed the importance of calcium and vitamin D in regular exercise for osteoporosis prevention. For overactive bladder she will be placed on oxybutynin transdermal to apply twice a week. Risks benefits and pros and cons were discussed. She was given prescription to get her shingles vaccine and latest information was provided. She will be referred to the hand surgeon for further evaluation of the right hand nodularity at the base of the second finger. Her cardiologist Dr. Patty Sermons is doing her lab work. No Pap smear done today the new guidelines were discussed.    Ok Edwards MD, 12:53 PM 04/16/2013

## 2013-04-16 NOTE — Patient Instructions (Addendum)
Shingles Vaccine What You Need to Know WHAT IS SHINGLES?  Shingles is a painful skin rash, often with blisters. It is also called Herpes Zoster or just Zoster.  A shingles rash usually appears on one side of the face or body and lasts from 2 to 4 weeks. Its main symptom is pain, which can be quite severe. Other symptoms of shingles can include fever, headache, chills, and upset stomach. Very rarely, a shingles infection can lead to pneumonia, hearing problems, blindness, brain inflammation (encephalitis), or death.  For about 1 person in 5, severe pain can continue even after the rash clears up. This is called post-herpetic neuralgia.  Shingles is caused by the Varicella Zoster virus. This is the same virus that causes chickenpox. Only someone who has had a case of chickenpox or rarely, has gotten chickenpox vaccine, can get shingles. The virus stays in your body. It can reappear many years later to cause a case of shingles.  You cannot catch shingles from another person with shingles. However, a person who has never had chickenpox (or chickenpox vaccine) could get chickenpox from someone with shingles. This is not very common.  Shingles is far more common in people 50 and older than in younger people. It is also more common in people whose immune systems are weakened because of a disease such as cancer or drugs such as steroids or chemotherapy.  At least 1 million people get shingles per year in the [place]. SHINGLES VACCINE  A vaccine for shingles was licensed in 2006. In clinical trials, the vaccine reduced the risk of shingles by 50%. It can also reduce the pain in people who still get shingles after being vaccinated.  A single dose of shingles vaccine is recommended for adults 60 years of age and older. SOME PEOPLE SHOULD NOT GET SHINGLES VACCINE OR SHOULD WAIT A person should not get shingles vaccine if he or she:  Has ever had a life-threatening allergic reaction to gelatin, the  antibiotic neomycin, or any other component of shingles vaccine. Tell your caregiver if you have any severe allergies.  Has a weakened immune system because of current:  AIDS or another disease that affects the immune system.  Treatment with drugs that affect the immune system, such as prolonged use of high-dose steroids.  Cancer treatment, such as radiation or chemotherapy.  Cancer affecting the bone marrow or lymphatic system, such as leukemia or lymphoma.  Is pregnant, or might be pregnant. Women should not become pregnant until at least 4 weeks after getting shingles vaccine. Someone with a minor illness, such as a cold, may be vaccinated. Anyone with a moderate or severe acute illness should usually wait until he or she recovers before getting the vaccine. This includes anyone with a temperature of 101.3 F (38 C) or higher. WHAT ARE THE RISKS FROM SHINGLES VACCINE?  A vaccine, like any medicine, could possibly cause serious problems, such as severe allergic reactions. However, the risk of a vaccine causing serious harm, or death, is extremely small.  No serious problems have been identified with shingles vaccine. Mild Problems  Redness, soreness, swelling, or itching at the site of the injection (about 1 person in 3).  Headache (about 1 person in 70). Like all vaccines, shingles vaccine is being closely monitored for unusual or severe problems. WHAT IF THERE IS A MODERATE OR SEVERE REACTION? What should I look for? Any unusual condition, such as a severe allergic reaction or a high fever. If a severe allergic reaction   occurred, it would be within a few minutes to an hour after the shot. Signs of a serious allergic reaction can include difficulty breathing, weakness, hoarseness or wheezing, a fast heartbeat, hives, dizziness, paleness, or swelling of the throat. What should I do?  Call your caregiver, or get the person to a caregiver right away.  Tell the caregiver what  happened, the date and time it happened, and when the vaccination was given.  Ask the caregiver to report the reaction by filing a Vaccine Adverse Event Reporting System (VAERS) form. Or, you can file this report through the VAERS web site at www.vaers.hhs.gov or by calling 1-800-822-7967. VAERS does not provide medical advice. HOW CAN I LEARN MORE?  Ask your caregiver. He or she can give you the vaccine package insert or suggest other sources of information.  Contact the Centers for Disease Control and Prevention (CDC):  Call 1-800-232-4636 (1-800-CDC-INFO).  Visit the CDC website at www.cdc.gov/vaccines CDC Shingles Vaccine VIS (07/27/08) Document Released: 08/05/2006 Document Revised: 12/31/2011 Document Reviewed: 07/27/2008 [employer] Patient Information 2014 [employer].  

## 2013-04-21 ENCOUNTER — Encounter: Payer: Self-pay | Admitting: Obstetrics and Gynecology

## 2013-05-04 ENCOUNTER — Encounter: Payer: Self-pay | Admitting: Obstetrics and Gynecology

## 2013-05-27 ENCOUNTER — Encounter: Payer: Self-pay | Admitting: Anesthesiology

## 2013-06-09 ENCOUNTER — Ambulatory Visit (INDEPENDENT_AMBULATORY_CARE_PROVIDER_SITE_OTHER): Payer: Medicare Other

## 2013-06-09 DIAGNOSIS — M8580 Other specified disorders of bone density and structure, unspecified site: Secondary | ICD-10-CM

## 2013-06-09 DIAGNOSIS — M899 Disorder of bone, unspecified: Secondary | ICD-10-CM

## 2013-07-01 ENCOUNTER — Other Ambulatory Visit: Payer: Self-pay | Admitting: *Deleted

## 2013-07-01 DIAGNOSIS — M8580 Other specified disorders of bone density and structure, unspecified site: Secondary | ICD-10-CM

## 2013-07-02 ENCOUNTER — Other Ambulatory Visit: Payer: Medicare Other

## 2013-07-02 DIAGNOSIS — M8580 Other specified disorders of bone density and structure, unspecified site: Secondary | ICD-10-CM

## 2013-07-03 LAB — PTH, INTACT AND CALCIUM: PTH: 60 pg/mL (ref 14.0–72.0)

## 2013-07-11 ENCOUNTER — Emergency Department (HOSPITAL_COMMUNITY)
Admission: EM | Admit: 2013-07-11 | Discharge: 2013-07-11 | Disposition: A | Discharge status: Home / Self Care | Payer: Medicare Other | Attending: Emergency Medicine | Admitting: Emergency Medicine

## 2013-07-11 ENCOUNTER — Emergency Department (HOSPITAL_COMMUNITY): Payer: Medicare Other

## 2013-07-11 ENCOUNTER — Encounter (HOSPITAL_COMMUNITY): Payer: Self-pay | Admitting: Emergency Medicine

## 2013-07-11 DIAGNOSIS — Z79899 Other long term (current) drug therapy: Secondary | ICD-10-CM | POA: Insufficient documentation

## 2013-07-11 DIAGNOSIS — Z8619 Personal history of other infectious and parasitic diseases: Secondary | ICD-10-CM | POA: Insufficient documentation

## 2013-07-11 DIAGNOSIS — X500XXA Overexertion from strenuous movement or load, initial encounter: Secondary | ICD-10-CM | POA: Insufficient documentation

## 2013-07-11 DIAGNOSIS — E785 Hyperlipidemia, unspecified: Secondary | ICD-10-CM | POA: Insufficient documentation

## 2013-07-11 DIAGNOSIS — F17200 Nicotine dependence, unspecified, uncomplicated: Secondary | ICD-10-CM | POA: Insufficient documentation

## 2013-07-11 DIAGNOSIS — Y9301 Activity, walking, marching and hiking: Secondary | ICD-10-CM | POA: Insufficient documentation

## 2013-07-11 DIAGNOSIS — R269 Unspecified abnormalities of gait and mobility: Secondary | ICD-10-CM | POA: Insufficient documentation

## 2013-07-11 DIAGNOSIS — Z8739 Personal history of other diseases of the musculoskeletal system and connective tissue: Secondary | ICD-10-CM | POA: Insufficient documentation

## 2013-07-11 DIAGNOSIS — Z7982 Long term (current) use of aspirin: Secondary | ICD-10-CM | POA: Insufficient documentation

## 2013-07-11 DIAGNOSIS — S8990XA Unspecified injury of unspecified lower leg, initial encounter: Secondary | ICD-10-CM | POA: Insufficient documentation

## 2013-07-11 DIAGNOSIS — M25562 Pain in left knee: Secondary | ICD-10-CM

## 2013-07-11 DIAGNOSIS — Z87448 Personal history of other diseases of urinary system: Secondary | ICD-10-CM | POA: Insufficient documentation

## 2013-07-11 DIAGNOSIS — Z8719 Personal history of other diseases of the digestive system: Secondary | ICD-10-CM | POA: Insufficient documentation

## 2013-07-11 DIAGNOSIS — Y92009 Unspecified place in unspecified non-institutional (private) residence as the place of occurrence of the external cause: Secondary | ICD-10-CM | POA: Insufficient documentation

## 2013-07-11 MED ORDER — HYDROCODONE-ACETAMINOPHEN 5-325 MG PO TABS: 1.0000 | ORAL_TABLET | ORAL | Status: DC | PRN

## 2013-07-11 MED ORDER — IBUPROFEN 200 MG PO TABS
600.0000 mg | ORAL_TABLET | Freq: Once | ORAL | Status: AC
  Administered 2013-07-11: 600 mg via ORAL
  Filled 2013-07-11: qty 3

## 2013-07-11 NOTE — ED Notes (Signed)
Pt states she is unable to ambulate in hall. States it is too painful to put weight on her knee. Pt also states her friend has a wheelchair she can borrow.

## 2013-07-11 NOTE — ED Notes (Signed)
Ortho tech at bedside for application of crutches.

## 2013-07-11 NOTE — ED Provider Notes (Signed)
CSN: [id number]     Arrival date & time 07/11/13  1916 History   First MD Initiated Contact with Patient 07/11/13 1952     Chief Complaint  Patient presents with  . Knee Pain   (Consider location/radiation/quality/duration/timing/severity/associated sxs/prior Treatment) HPI Comments: Patient states she was in her garden when she, moves she's felt a sudden pop in her left knee, since that, time.  She's been unable to bear weight.  She, states for the past 3, weeks.  She's had some discomfort in her posterior left knee, and mild swelling to the lower leg in the past 3, weeks she has not seen her primary care physician with this complaint of the posterior knee pain or leg swelling.  Has not taken any over-the-counter medication.  For pain relief since the injury  Patient is a 67 y.o. female presenting with knee pain. The history is provided by the patient.  Knee Pain Location:  Knee Time since incident:  2 hours Injury: no   Knee location:  L knee Pain details:    Quality:  Aching   Radiates to:  L leg   Severity:  Severe   Onset quality:  Sudden   Progression:  Unchanged Chronicity:  New Dislocation: no   Foreign body present:  No foreign bodies Prior injury to area:  Yes Relieved by:  None tried Worsened by:  Nothing tried Associated symptoms: no fever     Past Medical History  Diagnosis Date  . Hyperlipidemia   . Sinusitis     related to smoking  . GERD (gastroesophageal reflux disease)   . DI (detrusor instability)   . STD (sexually transmitted disease)     HSV 2  . Osteopenia    Past Surgical History  Procedure Laterality Date  . Other surgical history      T&A age 11  . Other surgical history  1989    tubal ligation  . Tubal ligation    . Tonsillectomy     Family History  Problem Relation Age of Onset  . Heart attack Mother 18  . Cancer Father 97    colon   History  Substance Use Topics  . Smoking status: Current Every Day Smoker -- 0.50 packs/day   Types: Cigarettes  . Smokeless tobacco: Not on file  . Alcohol Use: Yes     Comment: rare   OB History   Grav Para Term Preterm Abortions TAB SAB Ect Mult Living   0              Review of Systems  Constitutional: Negative for fever.  Musculoskeletal: Positive for joint swelling and gait problem.  Skin: Negative for wound.  Neurological: Negative for weakness.  All other systems reviewed and are negative.    Allergies  Review of patient's allergies indicates no known allergies.  Home Medications   Current Outpatient Rx  Name  Route  Sig  Dispense  Refill  . aspirin 325 MG tablet   Oral   Take 650 mg by mouth daily.         Marland Kitchen atorvastatin (LIPITOR) 40 MG tablet   Oral   Take 1 tablet (40 mg total) by mouth daily.   30 tablet   6   . cholecalciferol (VITAMIN D) 1000 UNITS tablet   Oral   Take 1,000 Units by mouth daily.         . GuaiFENesin (MUCINEX PO)   Oral   Take 600 mg by mouth as needed (for  congestion). Takes prn         . montelukast (SINGULAIR) 10 MG tablet   Oral   Take 10 mg by mouth at bedtime. As directed          . naproxen sodium (ANAPROX) 220 MG tablet   Oral   Take 220 mg by mouth 2 (two) times daily with a meal.         . oxybutynin (OXYTROL) 3.9 MG/24HR   Transdermal   Place 1 patch onto the skin 2 (two) times a week.   8 patch   12   . valACYclovir (VALTREX) 500 MG tablet   Oral   Take 1 tablet (500 mg total) by mouth daily.   30 tablet   11     Take one tablet daily   . HYDROcodone-acetaminophen (NORCO/VICODIN) 5-325 MG per tablet   Oral   Take 1 tablet by mouth every 4 (four) hours as needed for pain.   10 tablet   0    BP 138/76  Pulse 79  Temp(Src) 98.5 F (36.9 C) (Oral)  Resp 16  Ht 5\' 4"  (1.626 m)  Wt 163 lb (73.936 kg)  BMI 27.97 kg/m2  SpO2 96% Physical Exam  Nursing note and vitals reviewed. Constitutional: She appears well-developed and well-nourished.  HENT:  Head: Normocephalic.  Eyes:  Pupils are equal, round, and reactive to light.  Neck: Normal range of motion.  Cardiovascular: Normal rate and regular rhythm.   Pulmonary/Chest: Effort normal and breath sounds normal.  Musculoskeletal: She exhibits tenderness. She exhibits no edema.       Legs: Neurological: She is alert.  Skin: Skin is warm. No rash noted. No erythema. No pallor.    ED Course  Procedures (including critical care time) Labs Review Labs Reviewed - No data to display Imaging Review Dg Tibia/fibula Left  07/11/2013   *RADIOLOGY REPORT*  Clinical Data: Pain and lower leg with no known injury is  LEFT TIBIA AND FIBULA - 2 VIEW  Comparison: Left knee radiographs 07/11/2013  Findings: Joint spaces of the knee are maintained.  The tibia and fibula are intact.  No fracture or focal osseous lesion is identified.  Mild subcutaneous edema is noted diffusely.  IMPRESSION: No acute bony abnormality.  Mild diffuse subcutaneous edema.   Original Report Authenticated By: Britta Mccreedy, M.D.   Dg Knee Complete 4 Views Left  07/11/2013   CLINICAL DATA:  Knee pain  EXAM: LEFT KNEE - COMPLETE 4+ VIEW  COMPARISON:  None.  FINDINGS: There is no evidence of fracture, dislocation, or joint effusion. There is no evidence of arthropathy or other focal bone abnormality. Soft tissues are unremarkable.  IMPRESSION: Negative.   Electronically Signed   By: Signa Kell M.D.   On: 07/11/2013 20:39    MDM   1. Knee pain, acute, left     Do not feel any discrete abscess or cyst in the posterior right knee, but per patient's history this is most likely a Baker's cyst, and she will need an ultrasound in the near future for definitive diagnosis.  Tonight.  I will x-ray her knee and tib-fib to rule out fracture, subluxation, although this is unlikely X-ray has been reviewed.  There is no fracture in the knee or the tib-fib area.  Recommend that the patient.  Followup with her primary care physician for outpatient ultrasound to look for  possible Baker's cyst.  She's been placed in a knee sleeve for comfort given, ibuprofen, 600 mg ambulate.  The patient in the emergency department.  Prior to discharge The patient was fitted with crutches, to assist in her ambulation, which is poor.  She does have access to a wheelchair through her friend, who will be staying with her through the night  Arman Filter, NP 07/11/13 2228

## 2013-07-11 NOTE — ED Notes (Addendum)
Pt reports chronic back pain evaluated with MRI, and knee pain.  Pt stated she was walking in the garden and felt a "pop" to her L knee, pain now 9/10, unable to bear weight on knee, stated increased swelling to LLE since last evaluated by PCP.

## 2013-07-12 NOTE — ED Provider Notes (Signed)
Medical screening examination/treatment/procedure(s) were performed by non-physician practitioner and as supervising physician I was immediately available for consultation/collaboration.    Gilda Crease, MD 07/12/13 [id number]

## 2013-07-13 ENCOUNTER — Telehealth: Payer: Self-pay | Admitting: Cardiology

## 2013-07-13 NOTE — Telephone Encounter (Signed)
Need a referral to specialist for knee injury.

## 2013-07-13 NOTE — Telephone Encounter (Signed)
Spoke with patient and it was recommended at ED that she see Dr Shelle Iron, spoke with her and she will call them tomorrow.

## 2013-07-13 NOTE — Telephone Encounter (Signed)
New Problem  Saturday pt went to [facility] for knee problems... Needs recommendations on where to go for this issue

## 2013-07-21 ENCOUNTER — Ambulatory Visit (INDEPENDENT_AMBULATORY_CARE_PROVIDER_SITE_OTHER): Payer: Medicare Other | Admitting: Gynecology

## 2013-07-21 ENCOUNTER — Encounter: Payer: Self-pay | Admitting: Gynecology

## 2013-07-21 DIAGNOSIS — M899 Disorder of bone, unspecified: Secondary | ICD-10-CM

## 2013-07-21 DIAGNOSIS — M8580 Other specified disorders of bone density and structure, unspecified site: Secondary | ICD-10-CM

## 2013-07-21 MED ORDER — RISEDRONATE SODIUM 150 MG PO TABS: 150.0000 mg | ORAL_TABLET | ORAL | Status: DC

## 2013-07-21 NOTE — Progress Notes (Signed)
67 year old patient who was asked to come to the office today to discuss her recent bone density study. Patient has a history of osteopenia and states that she is now taking her calcium and vitamin D. Her last bone density study was in 2010 with the lowest T score of -1.7 at the [facility]. Her frax Analysis based on her right hip score of t -1.5 indicated that she did not need any additional treatment.  Bone density study done agree for gynecology in August of 2014 demonstrated that her lowest T score was -2.1 at the right femoral neck and the remainder of her regions of interest were also in the osteopenic range (decreased bone mineralization)  FRAX analysis demonstrated her 10 year risk of a hip fracture with 4% which exceeded differential of 3%. Her overall risk of osteoporotic fracture was 13% in the next 10 years with threshold being 20%.  Patient smokes approximately a pack of cigarettes per day. We had a lengthy discussion of the detrimental effects of smoking. Because she is 67 years of age and a smoker and on the results of the Frax analysis indicating her increased risk for fracture we discussed different treatment options. She will be started on anti-resorptive agent Actonel 150 mg by mouth q. Monthly the risks benefits and pros and cons were discussed to include spontaneous fractures of the hip and osteonecrosis of the jaw. Her recent calcium, vitamin D and PTH levels were normal. She was encouraged to do her weightbearing exercises 3 or 4 times a week and to continue with her calcium and vitamin D daily. Will repeat her bone density study in one year to monitor treatment. Literature information was provided.

## 2013-07-21 NOTE — Patient Instructions (Addendum)
Osteoporosis Throughout your life, your body breaks down old bone and replaces it with new bone. As you get older, your body does not replace bone as quickly as it breaks it down. By the age of 30 years, most people begin to gradually lose bone because of the imbalance between bone loss and replacement. Some people lose more bone than others. Bone loss beyond a specified normal degree is considered osteoporosis.  Osteoporosis affects the strength and durability of your bones. The inside of the ends of your bones and your flat bones, like the bones of your pelvis, look like honeycomb, filled with tiny open spaces. As bone loss occurs, your bones become less dense. This means that the open spaces inside your bones become bigger and the walls between these spaces become thinner. This makes your bones weaker. Bones of a person with osteoporosis can become so weak that they can break (fracture) during minor accidents, such as a simple fall. CAUSES  The following factors have been associated with the development of osteoporosis:  Smoking.  Drinking more than 2 alcoholic drinks several days per week.  Long-term use of certain medicines:  Corticosteroids.  Chemotherapy medicines.  Thyroid medicines.  Antiepileptic medicines.  Gonadal hormone suppression medicine.  Immunosuppression medicine.  Being underweight.  Lack of physical activity.  Lack of exposure to the sun. This can lead to vitamin D deficiency.  Certain medical conditions:  Certain inflammatory bowel diseases, such as Crohn's disease and ulcerative colitis.  Diabetes.  Hyperthyroidism.  Hyperparathyroidism. RISK FACTORS Anyone can develop osteoporosis. However, the following factors can increase your risk of developing osteoporosis:  Gender Women are at higher risk than men.  Age Being older than 50 years increases your risk.  Ethnicity White and  Asian people have an increased risk.  Weight Being extremely underweight can increase your risk of osteoporosis.  Family history of osteoporosis Having a family member who has developed osteoporosis can increase your risk. SYMPTOMS  Usually, people with osteoporosis have no symptoms.  DIAGNOSIS  Signs during a physical exam that may prompt your caregiver to suspect osteoporosis include:  Decreased height. This is usually caused by the compression of the bones that form your spine (vertebrae) because they have weakened and become fractured.  A curving or rounding of the upper back (kyphosis). To confirm signs of osteoporosis, your caregiver may request a procedure that uses 2 low-dose X-ray beams with different levels of energy to measure your bone mineral density (dual-energy X-ray absorptiometry [DXA]). Also, your caregiver may check your level of vitamin D. TREATMENT  The goal of osteoporosis treatment is to strengthen bones in order to decrease the risk of bone fractures. There are different types of medicines available to help achieve this goal. Some of these medicines work by slowing the processes of bone loss. Some medicines work by increasing bone density. Treatment also involves making sure that your levels of calcium and vitamin D are adequate. PREVENTION  There are things you can do to help prevent osteoporosis. Adequate intake of calcium and vitamin D can help you achieve optimal bone mineral density. Regular exercise can also help, especially resistance and weight-bearing activities. If you smoke, quitting smoking is an important part of osteoporosis prevention. MAKE SURE YOU:  Understand these instructions.  Will watch your condition.  Will get help right away if you are not doing well or get worse. Document Released: 07/18/2005 Document Revised: 09/24/2012 Document Reviewed: 09/22/2011 [facility] Patient Information 2014 [place], [place]. Risedronate weekly tablets  (Actonel) What  is this medicine? RISEDRONATE (ris ED roe nate) slows calcium loss from the bone. It helps to make normal healthy bone and to slow bone loss in people with Paget's disease and osteoporosis. It may also be used in others at risk for bone loss. This medicine may be used for other purposes; ask your health care provider or pharmacist if you have questions. What should I tell my health care provider before I take this medicine? They need to know if you have any of these conditions: -esophageal, stomach, or intestinal problems, like acid reflux or GERD -dental disease -kidney disease -low blood calcium -problems sitting or standing for 30 minutes -trouble swallowing -an unusual or allergic reaction to risedronate, other medicines, foods, dyes, or preservatives -pregnant or trying to get pregnant -breast-feeding How should I use this medicine? You must take this medicine exactly as directly or you will lower the amount of the medicine that you absorb into your body or you may cause yourself harm. Take this medicine by mouth first thing in the morning, after you are up for the day. Do not eat or drink anything before you take this medicine. Swallow the tablet with a full glass (6 to 8 ounces) of plain water. Do not take this medicine with any other drink. Do not chew, crush, or let the tablet dissolve in your mouth. After taking this medicine, do not eat breakfast, drink, or take any other medicines or vitamins for at least 30 minutes. Stand or sit up for at least 30 minutes after you take this medicine; do not lie down. Take this medicine on the same day every week. Do not take your medicine more often than directed. Talk to your pediatrician regarding the use of this medicine in children. Special care may be needed. Overdosage: If you think you have taken too much of this medicine contact a poison control center or emergency room at once. NOTE: This medicine is only for you. Do not share  this medicine with others. What if I miss a dose? If you miss a dose, take the dose on the morning after you remember. Then take your next dose on your regular day of the week. Never take 2 tablets on the same day. Do not take double or extra doses. What may interact with this medicine? -aluminum hydroxide -antacids -aspirin -calcium supplements -drugs for inflammation like ibuprofen, naproxen, and others -iron supplements -magnesium supplements -vitamins with minerals This list may not describe all possible interactions. Give your health care provider a list of all the medicines, herbs, non-prescription drugs, or dietary supplements you use. Also tell them if you smoke, drink alcohol, or use illegal drugs. Some items may interact with your medicine. What should I watch for while using this medicine? Visit your doctor or health care professional for regular check ups. It may be some time before you see the benefit from this medicine. Do not stop taking your medicine unless your doctor tells you to. Your doctor may order blood tests or other tests to see how you are doing. You should make sure that you get enough calcium and vitamin D while you are taking this medicine. Discuss the foods you eat and the vitamins you take with your health care professional. Some people who take this medicine have severe bone, joint, and/or muscle pain. This medicine may also increase your risk for a broken thigh bone. Tell your doctor right away if you have pain in your upper leg or groin. Tell your doctor if  you have any pain that does not go away or that gets worse. What side effects may I notice from receiving this medicine? Side effects that you should report to your doctor as soon as possible: -allergic reactions such as skin rash or itching, hives, swelling of the face, lips, throat, or tongue -black or tarry stools -changes in vision -heartburn or stomach pain -jaw pain, especially after dental  work -pain or difficulty when swallowing -redness, blistering, peeling, or loosening of the skin, including inside the mouth Side effects that usually do not require medical attention (report to your doctor if they continue or are bothersome): -bone, muscle, or joint pain -changes in taste -diarrhea or constipation -eye pain or itching -headache -nausea or vomiting -stomach gas or fullness This list may not describe all possible side effects. Call your doctor for medical advice about side effects. You may report side effects to FDA at 1-800-FDA-1088. Where should I keep my medicine? Keep out of the reach of children. Store at room temperature between 20 and 25 degrees C (68 and 77 degrees F). Throw away any unused medication after the expiration date. NOTE: This sheet is a summary. It may not cover all possible information. If you have questions about this medicine, talk to your doctor, pharmacist, or health care provider.  2012, [employer]vier/Gold Standard. (04/06/2011 8:53:56 AM)

## 2013-07-31 ENCOUNTER — Telehealth: Payer: Self-pay | Admitting: Cardiology

## 2013-07-31 NOTE — Telephone Encounter (Signed)
Did leave message to call back and scheduled ov

## 2013-07-31 NOTE — Telephone Encounter (Signed)
Scheduled ov for patient later this month

## 2013-07-31 NOTE — Telephone Encounter (Signed)
F/U  Patient stated that she is returning Melinda's call. She can be reached until 5pm today

## 2013-07-31 NOTE — Telephone Encounter (Signed)
Will forward to Melinda. 

## 2013-08-18 ENCOUNTER — Ambulatory Visit (INDEPENDENT_AMBULATORY_CARE_PROVIDER_SITE_OTHER): Payer: Medicare Other | Admitting: Cardiology

## 2013-08-18 ENCOUNTER — Encounter: Payer: Self-pay | Admitting: Cardiology

## 2013-08-18 VITALS — BP 124/64 | HR 86 | Ht 64.0 in | Wt 168.0 lb

## 2013-08-18 DIAGNOSIS — M1990 Unspecified osteoarthritis, unspecified site: Secondary | ICD-10-CM

## 2013-08-18 DIAGNOSIS — Z720 Tobacco use: Secondary | ICD-10-CM

## 2013-08-18 DIAGNOSIS — E7800 Pure hypercholesterolemia, unspecified: Secondary | ICD-10-CM

## 2013-08-18 DIAGNOSIS — F17200 Nicotine dependence, unspecified, uncomplicated: Secondary | ICD-10-CM

## 2013-08-18 DIAGNOSIS — R079 Chest pain, unspecified: Secondary | ICD-10-CM

## 2013-08-18 LAB — CBC WITH DIFFERENTIAL/PLATELET
Basophils Absolute: 0.1 10*3/uL (ref 0.0–0.1)
Eosinophils Absolute: 0.1 10*3/uL (ref 0.0–0.7)
HCT: 39.8 % (ref 36.0–46.0)
Hemoglobin: 13.5 g/dL (ref 12.0–15.0)
Lymphs Abs: 2.3 10*3/uL (ref 0.7–4.0)
MCHC: 34 g/dL (ref 30.0–36.0)
MCV: 99.7 fl (ref 78.0–100.0)
Monocytes Absolute: 0.5 10*3/uL (ref 0.1–1.0)
Neutro Abs: 4.1 10*3/uL (ref 1.4–7.7)
RDW: 13.9 % (ref 11.5–14.6)

## 2013-08-18 LAB — HEPATIC FUNCTION PANEL
Bilirubin, Direct: 0.1 mg/dL (ref 0.0–0.3)
Total Bilirubin: 0.8 mg/dL (ref 0.3–1.2)

## 2013-08-18 LAB — LIPID PANEL
HDL: 56.6 mg/dL (ref >39.00)
Total CHOL/HDL Ratio: 3
VLDL: 19.2 mg/dL (ref 0.0–40.0)

## 2013-08-18 LAB — BASIC METABOLIC PANEL
CO2: 28 mEq/L (ref 19–32)
Glucose, Bld: 104 mg/dL — ABNORMAL HIGH (ref 70–99)
Potassium: 4.2 mEq/L (ref 3.5–5.1)
Sodium: 140 mEq/L (ref 135–145)

## 2013-08-18 NOTE — Assessment & Plan Note (Signed)
Patient continues to smoke against advice.  We spoke again about the importance of quitting.  She has not yet made up her mind whether she will do so

## 2013-08-18 NOTE — Progress Notes (Signed)
Kenton Kingfisher Date of Birth:  01-07-1946 [address] [place], [place]  [address] [phone]  Fax   [phone]  HPI: This pleasant 67 year old woman is seen for a scheduled followup office visit. . She has a past history of hypercholesterolemia.  She continues on Lipitor 40 mg daily. She's not having myalgias or side effects from the Lipitor. She does not have any history of coronary disease. She had a normal stress Cardiolite in 2007. The patient has not been experiencing any substernal chest pain.  Since last visit the patient had a spontaneous rupture of the left baker's cyst behind her left knee.  This occurred while she was working in the garden a month ago.  She saw orthopedist Dr. Paula Libra who prescribed pain medication in the form of hydrocodone at bedtime and she takes Motrin during the day.  She is still having moderate pain in the left leg from the ruptured Baker's cyst but the pain is improving slowly.  She has been less physically active and her weight is up 8 pounds  Current Outpatient Prescriptions  Medication Sig Dispense Refill  . atorvastatin (LIPITOR) 40 MG tablet Take 1 tablet (40 mg total) by mouth daily.  30 tablet  6  . cholecalciferol (VITAMIN D) 1000 UNITS tablet Take 1,000 Units by mouth daily.      . GuaiFENesin (MUCINEX PO) Take 600 mg by mouth as needed (for congestion). Takes prn      . HYDROcodone-acetaminophen (NORCO/VICODIN) 5-325 MG per tablet Take 1 tablet by mouth every 4 (four) hours as needed for pain.  10 tablet  0  . montelukast (SINGULAIR) 10 MG tablet Take 10 mg by mouth at bedtime. As directed       . naproxen sodium (ANAPROX) 220 MG tablet Take 220 mg by mouth 2 (two) times daily with a meal.      . oxybutynin (OXYTROL) 3.9 MG/24HR Place 1 patch onto the skin 2 (two) times a week.  8 patch  12  . risedronate (ACTONEL) 150 MG tablet Take 1 tablet (150 mg total) by mouth every 30 (thirty) days. with water on empty stomach, nothing  by mouth or lie down for next 30 minutes.  1 tablet  11  . valACYclovir (VALTREX) 500 MG tablet Take 1 tablet (500 mg total) by mouth daily.  30 tablet  11   No current facility-administered medications for this visit.    No Known Allergies  Patient Active Problem List   Diagnosis Date Noted  . OAB (overactive bladder) 04/16/2013  . Chest pain at rest 11/03/2012  . Tobacco abuse 08/26/2012  . DI (detrusor instability)   . STD (sexually transmitted disease)   . Osteopenia   . Hypercholesterolemia 08/20/2011  . Sinusitis   . GERD (gastroesophageal reflux disease)     History  Smoking status  . Current Every Day Smoker -- 0.50 packs/day  . Types: Cigarettes  Smokeless tobacco  . Not on file    History  Alcohol Use  . Yes    Comment: rare    Family History  Problem Relation Age of Onset  . Heart attack Mother 39  . Cancer Father 28    colon    Review of Systems: The patient denies any heat or cold intolerance.  No weight gain or weight loss.  The patient denies headaches or blurry vision.  There is no cough or sputum production.  The patient denies dizziness.  There is no hematuria or  hematochezia.  The patient denies any muscle aches or arthritis.  The patient denies any rash.  The patient denies frequent falling or instability.  There is no history of depression or anxiety.  All other systems were reviewed and are negative.   Physical Exam: Filed Vitals:   08/18/13 0938  BP: 124/64  Pulse: 86   the general appearance reveals a well-developed well-nourished woman in no distress.The head and neck exam reveals pupils equal and reactive.  Extraocular movements are full.  There is no scleral icterus.  The mouth and pharynx are normal.  The neck is supple.  The carotids reveal no bruits.  The jugular venous pressure is normal.  The  thyroid is not enlarged.  There is no lymphadenopathy.  The chest is clear to percussion and auscultation.  There are no rales or rhonchi.   Expansion of the chest is symmetrical.  The precordium is quiet.  The first heart sound is normal.  The second heart sound is physiologically split.  There is no murmur gallop rub or click.  There is no abnormal lift or heave.  The abdomen is soft and nontender.  The bowel sounds are normal.  The liver and spleen are not enlarged.  There are no abdominal masses.  There are no abdominal bruits.  Extremities reveal good pedal pulses.  There is no phlebitis or edema.  There is no cyanosis or clubbing.  Strength is normal and symmetrical in all extremities.  There is no lateralizing weakness.  There are no sensory deficits.  The skin is warm and dry.  There is no rash.      Assessment / Plan: The patient is to continue same medication.  The more careful with calories and try to lose weight.  Recheck in one year for office visit EKG lipid panel hepatic function panel and basal metabolic panel.

## 2013-08-18 NOTE — Assessment & Plan Note (Signed)
The patient has not been experiencing any adverse effects from her statin therapy.  Blood work  was drawn today results are pending.

## 2013-08-18 NOTE — Assessment & Plan Note (Signed)
When we last saw the patient as a work in in January 2014 she was having some persistent discomfort in the left posterior parasternal thoracic area.  Workup including chest x-ray and MRI did not show any specific cause and the discomfort gradually subsided.  The patient feels it was probably a muscle spasm or pulled muscle.

## 2013-08-18 NOTE — Patient Instructions (Signed)
Your physician recommends that you continue on your current medications as directed. Please refer to the Current Medication list given to you today.  Your physician wants you to follow-up in: 1 year. You will receive a reminder letter in the mail two months in advance. If you don't receive a letter, please call our office to schedule the follow-up appointment.  

## 2013-08-19 NOTE — Progress Notes (Signed)
Quick Note:  Please report to patient. The recent labs are stable. Continue same medication and careful diet. ______ 

## 2013-08-26 ENCOUNTER — Telehealth: Payer: Self-pay | Admitting: *Deleted

## 2013-08-26 NOTE — Telephone Encounter (Signed)
Mailed copy of labs and left message to call if any questions  

## 2013-08-26 NOTE — Telephone Encounter (Signed)
Message copied by Burnell Blanks on Wed Aug 26, 2013 11:49 AM ------      Message from: Cassell Clement      Created: Wed Aug 19, 2013  8:59 AM       Please report to patient.  The recent labs are stable. Continue same medication and careful diet. ------

## 2013-10-12 ENCOUNTER — Other Ambulatory Visit: Payer: Self-pay | Admitting: Cardiology

## 2014-04-18 ENCOUNTER — Other Ambulatory Visit: Payer: Self-pay | Admitting: Cardiology

## 2014-04-27 ENCOUNTER — Encounter: Payer: Medicare Other | Admitting: Gynecology

## 2014-05-04 ENCOUNTER — Encounter: Payer: Medicare Other | Admitting: Gynecology

## 2014-05-06 ENCOUNTER — Encounter: Payer: Self-pay | Admitting: Gynecology

## 2014-05-06 ENCOUNTER — Ambulatory Visit (INDEPENDENT_AMBULATORY_CARE_PROVIDER_SITE_OTHER): Payer: Medicare Other | Admitting: Gynecology

## 2014-05-06 VITALS — BP 130/78 | Ht 64.0 in | Wt 157.0 lb

## 2014-05-06 DIAGNOSIS — M8580 Other specified disorders of bone density and structure, unspecified site: Secondary | ICD-10-CM

## 2014-05-06 DIAGNOSIS — N318 Other neuromuscular dysfunction of bladder: Secondary | ICD-10-CM

## 2014-05-06 DIAGNOSIS — M949 Disorder of cartilage, unspecified: Secondary | ICD-10-CM

## 2014-05-06 DIAGNOSIS — N952 Postmenopausal atrophic vaginitis: Secondary | ICD-10-CM

## 2014-05-06 DIAGNOSIS — N3281 Overactive bladder: Secondary | ICD-10-CM

## 2014-05-06 DIAGNOSIS — F17200 Nicotine dependence, unspecified, uncomplicated: Secondary | ICD-10-CM

## 2014-05-06 DIAGNOSIS — Z8639 Personal history of other endocrine, nutritional and metabolic disease: Secondary | ICD-10-CM

## 2014-05-06 DIAGNOSIS — M899 Disorder of bone, unspecified: Secondary | ICD-10-CM

## 2014-05-06 DIAGNOSIS — Z23 Encounter for immunization: Secondary | ICD-10-CM

## 2014-05-06 DIAGNOSIS — N942 Vaginismus: Secondary | ICD-10-CM

## 2014-05-06 MED ORDER — ALENDRONATE SODIUM 70 MG PO TABS: 70.0000 mg | ORAL_TABLET | ORAL | Status: DC

## 2014-05-06 MED ORDER — VALACYCLOVIR HCL 500 MG PO TABS: 500.0000 mg | ORAL_TABLET | Freq: Every day | ORAL | Status: DC

## 2014-05-06 NOTE — Progress Notes (Signed)
Sharon Robinson [birth year] [id number]   History:    68 y.o.  for GYN followup.Patient has a history of osteopenia and states that she is now taking her calcium and vitamin D her bone density study done in our office August 2014 demonstrated that her lowest T. score was that the right femoral neck with a value of -2.1 her 10 year fracture risk (FRAX) demonstrated that her risk of hip fracture was 4% exceeding the threshold of 3%. Her major osteoporotic risk in the next 10 years is 13% which is below threshold which is considered above 20%. She was placed on Actonel last year but took it for 6 months and discontinued because it was too expensive. Patient had a history of overactive bladder no longer taking her medication. She continues to smoke but she states that she does not inhale. Dr. [name] is her PCP and has been treating her for hyperlipidemia and vitamin D deficiency. Patient with no prior history of abnormal Pap smear. Her colonoscopy was normal in 2009. Her father had history of colon cancer and she is overdue. She had her shingles vaccine last year. And her mammogram was normal this year.    Past medical history,surgical history, family history and social history were all reviewed and documented in the EPIC chart.  Gynecologic History No LMP recorded. Patient is postmenopausal. Contraception: post menopausal status Last Pap: 2013. Results were: normal Last mammogram: 2015. Results were: normal  Obstetric History OB History  Gravida Para Term Preterm AB SAB TAB Ectopic Multiple Living  0                  ROS: A ROS was performed and pertinent positives and negatives are included in the history.  GENERAL: No fevers or chills. HEENT: No change in vision, no earache, sore throat or sinus congestion. NECK: No pain or stiffness. CARDIOVASCULAR: No chest pain or pressure. No palpitations. PULMONARY: No shortness of breath, cough or wheeze. GASTROINTESTINAL: No abdominal pain, nausea,  vomiting or diarrhea, melena or bright red blood per rectum. GENITOURINARY: No urinary frequency, urgency, hesitancy or dysuria. MUSCULOSKELETAL: No joint or muscle pain, no back pain, no recent trauma. DERMATOLOGIC: No rash, no itching, no lesions. ENDOCRINE: No polyuria, polydipsia, no heat or cold intolerance. No recent change in weight. HEMATOLOGICAL: No anemia or easy bruising or bleeding. NEUROLOGIC: No headache, seizures, numbness, tingling or weakness. PSYCHIATRIC: No depression, no loss of interest in normal activity or change in sleep pattern.     Exam: chaperone present  BP 130/78  Ht 5\' 4"  (1.626 m)  Wt 157 lb (71.215 kg)  BMI 26.94 kg/m2  Body mass index is 26.94 kg/(m^2).  General appearance : Well developed well nourished female. No acute distress HEENT: Neck supple, trachea midline, no carotid bruits, no thyroidmegaly Lungs: Clear to auscultation, no rhonchi or wheezes, or rib retractions  Heart: Regular rate and rhythm, no murmurs or gallops Breast:Examined in sitting and supine position were symmetrical in appearance, no palpable masses or tenderness,  no skin retraction, no nipple inversion, no nipple discharge, no skin discoloration, no axillary or supraclavicular lymphadenopathy Abdomen: no palpable masses or tenderness, no rebound or guarding Extremities: no edema or skin discoloration or tenderness  Pelvic:  Bartholin, Urethra, Skene Glands: Within normal limits             Vagina: No gross lesions or discharge  Cervix: No gross lesions or discharge  Uterus vaginismus incomplete exam  Adnexa  vaginismus incomplete exam  Anus and perineum  normal   Rectovaginal  normal sphincter tone without palpated masses or tenderness             Hemoccult PCP provides     Assessment/Plan:  68 y.o. female for GYN exam and followup. Patient will have to return to the office for an ultrasound to better assess her uterus and ovaries due to the fact that because of her vaginismus  pelvic exam was an adequate. Pap smear was not done today in accordance to the new guidelines. She will receive her Tdap vaccine today. She was encouraged to stop smoking once again. She has vaginal atrophy but is not sexually active and is asymptomatic. Blood work will be drawn by her PCP. Patient no longer bothered by overactive bladder and no longer taking the medication. Because of her high fracture risk she will be changed to Fosamax 70 mg q. weekly which is more cost-effective for the patient. The risk to include osteonecrosis of the jaw and spontaneous subtrochanteric fractures were discussed. Literature information and instructions were provided.   Note: This dictation was prepared with  Dragon/digital dictation along withSmart phrase technology. Any transcriptional errors that result from this process are unintentional.   Terrance Mass MD, 3:03 PM 05/06/2014

## 2014-05-06 NOTE — Patient Instructions (Signed)
Tetanus, Diphtheria (Td) Vaccine  What You Need to Know  WHY GET VACCINATED?  Tetanus  and diphtheria are very serious diseases. They are rare in the [place] today, but people who do become infected often have severe complications. Td vaccine is used to protect adolescents and adults from both of these diseases.  Both tetanus and diphtheria are infections caused by bacteria. Diphtheria spreads from person to person through coughing or sneezing. Tetanus-causing bacteria enter the body through cuts, scratches, or wounds.  TETANUS (Lockjaw) causes painful muscle tightening and stiffness, usually all over the body.  · It can lead to tightening of muscles in the head and neck so you can't open your mouth, swallow, or sometimes even breathe. Tetanus kills about 1 out of every 5 people who are infected.  DIPHTHERIA can cause a thick coating to form in the back of the throat.  · It can lead to breathing problems, paralysis, heart failure, and death.  Before vaccines, the [place] saw as many as 200,000 cases a year of diphtheria and hundreds of cases of tetanus. Since vaccination began, cases of both diseases have dropped by about 99%.  TD VACCINE  Td vaccine can protect adolescents and adults from tetanus and diphtheria. Td is usually given as a booster dose every 10 years but it can also be given earlier after a severe and dirty wound or burn.  Your doctor can give you more information.  Td may safely be given at the same time as other vaccines.  SOME PEOPLE SHOULD NOT GET THIS VACCINE  · If you ever had a life-threatening allergic reaction after a dose of any tetanus or diphtheria containing vaccine, OR if you have a severe allergy to any part of this vaccine, you should not get Td. Tell your doctor if you have any severe allergies.  · Talk to your doctor if you:  ¨ have epilepsy or another nervous system problem,  ¨ had severe pain or swelling after any vaccine containing diphtheria or tetanus,  ¨ ever had  Guillain Barré Syndrome (GBS),  ¨ aren't feeling well on the day the shot is scheduled.  RISKS OF A VACCINE REACTION  With a vaccine, like any medicine, there is a chance of side effects. These are usually mild and go away on their own.  Serious side effects are also possible, but are very rare.  Most people who get Td vaccine do not have any problems with it.  Mild Problems  following Td  (Did not interfere with activities)  · Pain where the shot was given (about 8 people in 10)  · Redness or swelling where the shot was given (about 1 person in 3)  · Mild fever (about 1 person in 15)  · Headache or Tiredness (uncommon)  Moderate Problems following Td  (Interfered with activities, but did not require medical attention)  · Fever over 102° F (38.9° C) (rare)  Severe Problems  following Td  (Unable to perform usual activities; required medical attention)  · Swelling, severe pain, bleeding, or redness in the arm where the shot was given (rare).  Problems that could happen after any vaccine:  · Brief fainting spells can happen after any medical procedure, including vaccination. Sitting or lying down for about 15 minutes can help prevent fainting, and injuries caused by a fall. Tell your doctor if you feel dizzy, or have vision changes or ringing in the ears.  · Severe shoulder pain and reduced range of motion in the   arm where a shot was given can happen, very rarely, after a vaccination.  · Severe allergic reactions from a vaccine are very rare, estimated at less than 1 in a million doses. If one were to occur, it would usually be within a few minutes to a few hours after the vaccination.  WHAT IF THERE IS A SERIOUS REACTION?  What should I look for?  · Look for anything that concerns you, such as signs of a severe allergic reaction, very high fever, or behavior changes.  Signs of a severe allergic reaction can include hives, swelling of the face and throat, difficulty breathing, a fast heartbeat, dizziness, and  weakness. These would usually start a few minutes to a few hours after the vaccination.  What should I do?  · If you think it is a severe allergic reaction or other emergency that can't wait, call 911 or get the person to the nearest hospital. Otherwise, call your doctor.  · Afterward, the reaction should be reported to the Vaccine Adverse Event Reporting System (VAERS). Your doctor might file this report, or, you can do it yourself through the VAERS website or by calling 1-800-822-7967.  VAERS is only for reporting reactions. They do not give medical advice.  THE NATIONAL VACCINE INJURY COMPENSATION PROGRAM  The National Vaccine Injury Compensation Program (VICP) is a federal program that was created to compensate people who may have been injured by certain vaccines.  Persons who believe they may have been injured by a vaccine can learn about the program and about filing a claim by calling 1-800-338-2382 or visiting the VICP website.  HOW CAN I LEARN MORE?  · Ask your doctor.  · Contact your local or state health department.  · Contact the Centers for Disease Control and Prevention (CDC):  ¨ Call 1-800-232-4636 (1-800-CDC-INFO)  ¨ Visit CDC's vaccines website  CDC Td Vaccine Interim VIS (11/25/12)  Document Released: 08/05/2006 Document Revised: 02/02/2013 Document Reviewed: 01/28/2013  ExitCare® Patient Information ©2015 [employer]. This information is not intended to replace advice given to you by your health care provider. Make sure you discuss any questions you have with your health care provider.

## 2014-05-07 LAB — VITAMIN D 25 HYDROXY (VIT D DEFICIENCY, FRACTURES): Vit D, 25-Hydroxy: 40 ng/mL (ref 30–89)

## 2014-05-07 LAB — PTH, INTACT AND CALCIUM
CALCIUM: 9.2 mg/dL (ref 8.4–10.5)
PTH: 38.9 pg/mL (ref 14.0–72.0)

## 2014-06-02 ENCOUNTER — Ambulatory Visit (INDEPENDENT_AMBULATORY_CARE_PROVIDER_SITE_OTHER): Payer: Medicare Other | Admitting: Gynecology

## 2014-06-02 ENCOUNTER — Ambulatory Visit (INDEPENDENT_AMBULATORY_CARE_PROVIDER_SITE_OTHER): Payer: Medicare Other

## 2014-06-02 ENCOUNTER — Other Ambulatory Visit: Payer: Self-pay | Admitting: Gynecology

## 2014-06-02 DIAGNOSIS — M8580 Other specified disorders of bone density and structure, unspecified site: Secondary | ICD-10-CM

## 2014-06-02 DIAGNOSIS — M899 Disorder of bone, unspecified: Secondary | ICD-10-CM

## 2014-06-02 DIAGNOSIS — D251 Intramural leiomyoma of uterus: Secondary | ICD-10-CM

## 2014-06-02 DIAGNOSIS — N83339 Acquired atrophy of ovary and fallopian tube, unspecified side: Secondary | ICD-10-CM

## 2014-06-02 DIAGNOSIS — N942 Vaginismus: Secondary | ICD-10-CM

## 2014-06-02 DIAGNOSIS — D259 Leiomyoma of uterus, unspecified: Secondary | ICD-10-CM

## 2014-06-02 DIAGNOSIS — M949 Disorder of cartilage, unspecified: Secondary | ICD-10-CM

## 2014-06-02 NOTE — Progress Notes (Signed)
   68 year-old patient who presented to the office today to discuss her ultrasound. Patient was seen in the office on July 16 but due to her vaginismus and an inadequate pelvic exam she was asked to come to the office today for this study. She was otherwise doing well. Ultrasound: Uterus measures 6.3 x 3.9 x 2.8 cm with endometrial stripe of 2.7 mm. Intramural fibroid measuring 21 x 22 x 25 mm was noted. Several nabothian cysts were documented. Both ovaries were atrophic and no adnexal masses were noted.  Assessment/plan: Postmenopausal patient with very small intramural fibroid. Postmenopausal atrophic ovaries. Patient with history of osteopenia high-risk for fracture based on FRAX  Analyses has started Fosamax 70 mg weekly which she is tolerating well. We also discussed about the importance of calcium and vitamin D daily. Her recent calcium and vitamin D levels were normal.

## 2014-08-16 ENCOUNTER — Other Ambulatory Visit: Payer: Self-pay | Admitting: *Deleted

## 2014-08-16 ENCOUNTER — Other Ambulatory Visit (INDEPENDENT_AMBULATORY_CARE_PROVIDER_SITE_OTHER): Payer: Medicare Other | Admitting: *Deleted

## 2014-08-16 DIAGNOSIS — E7800 Pure hypercholesterolemia, unspecified: Secondary | ICD-10-CM

## 2014-08-16 LAB — BASIC METABOLIC PANEL
BUN: 18 mg/dL (ref 6–23)
CALCIUM: 8.9 mg/dL (ref 8.4–10.5)
CO2: 31 mEq/L (ref 19–32)
CREATININE: 0.8 mg/dL (ref 0.4–1.2)
Chloride: 106 mEq/L (ref 96–112)
GFR: 81.1 mL/min (ref >60.00)
GLUCOSE: 93 mg/dL (ref 70–99)
Potassium: 4 mEq/L (ref 3.5–5.1)
Sodium: 140 mEq/L (ref 135–145)

## 2014-08-16 LAB — HEPATIC FUNCTION PANEL
ALT: 20 U/L (ref 0–35)
AST: 20 U/L (ref 0–37)
Albumin: 3.4 g/dL — ABNORMAL LOW (ref 3.5–5.2)
Alkaline Phosphatase: 62 U/L (ref 39–117)
BILIRUBIN TOTAL: 0.8 mg/dL (ref 0.2–1.2)
Bilirubin, Direct: 0.1 mg/dL (ref 0.0–0.3)
Total Protein: 7 g/dL (ref 6.0–8.3)

## 2014-08-16 LAB — LIPID PANEL
Cholesterol: 122 mg/dL (ref 0–200)
HDL: 47.1 mg/dL (ref >39.00)
LDL Cholesterol: 64 mg/dL (ref 0–99)
NONHDL: 74.9
Total CHOL/HDL Ratio: 3
Triglycerides: 56 mg/dL (ref 0.0–149.0)
VLDL: 11.2 mg/dL (ref 0.0–40.0)

## 2014-08-17 NOTE — Progress Notes (Signed)
Quick Note:  Please make copy of labs for patient visit. ______ 

## 2014-08-18 IMAGING — CT CT CHEST W/ CM
2 of 4 series · 15 of 36 positions shown, 18 images · IV contrast (Omnipaque 300)
Comparison: None.

CLINICAL DATA: Patient with left shoulder blade pain since and [DATE].

CT CHEST WITH CONTRAST
TECHNIQUE: Multidetector CT imaging of the chest was performed
following the standard protocol during bolus administration of
intravenous contrast.
Contrast: 80mL OMNIPAQUE IOHEXOL 300 MG/ML  SOLN

[Series 2: chest routine with · axial · 0.64mm/px · z∈[-314,-54]mm · 12 of 62 slices shown, 15 images]
[im 5/62  mediastinal]
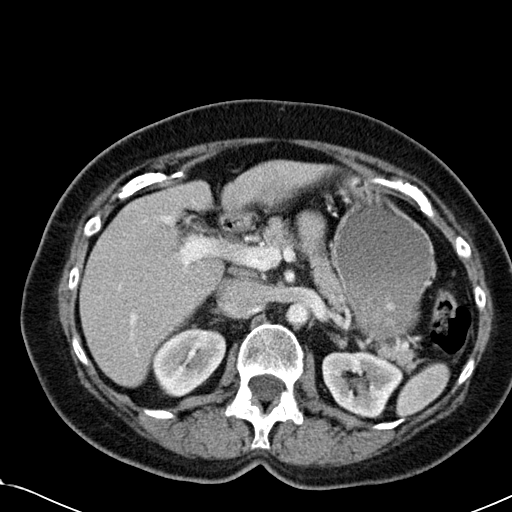
[im 5/62  lung]
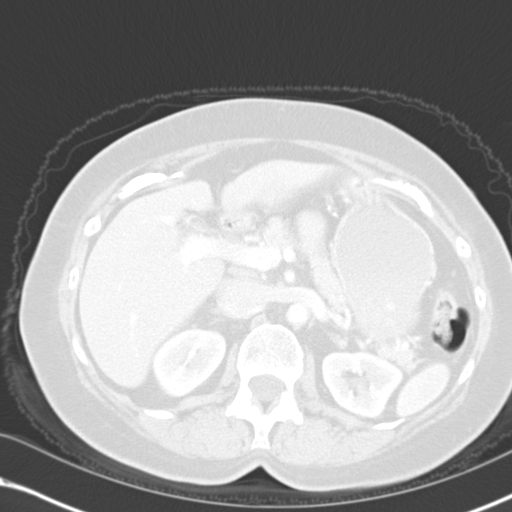
[im 10/62  lung]
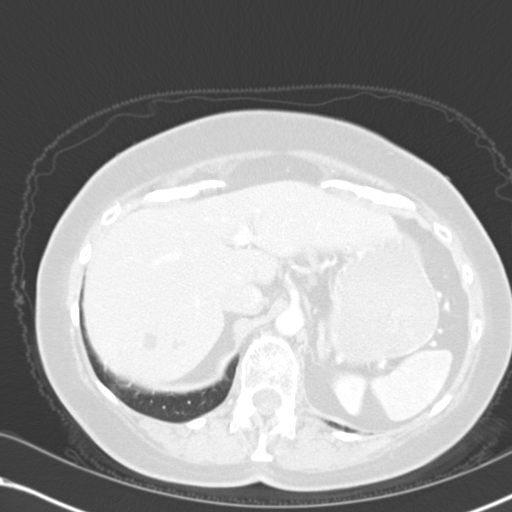
[im 15/62  lung]
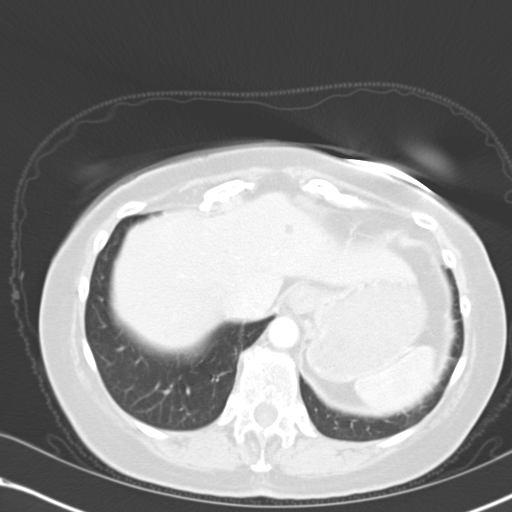
[im 19/62  lung]
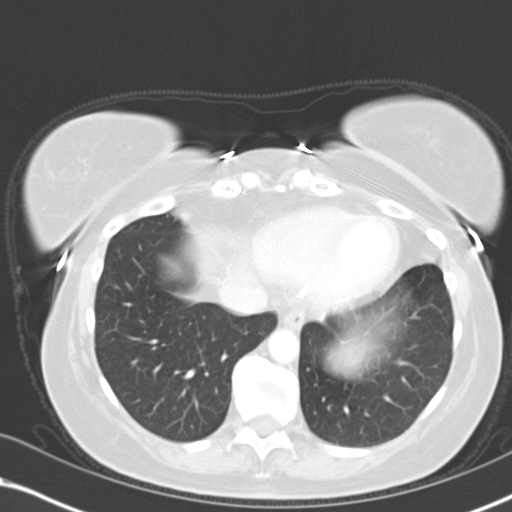
[im 24/62  mediastinal]
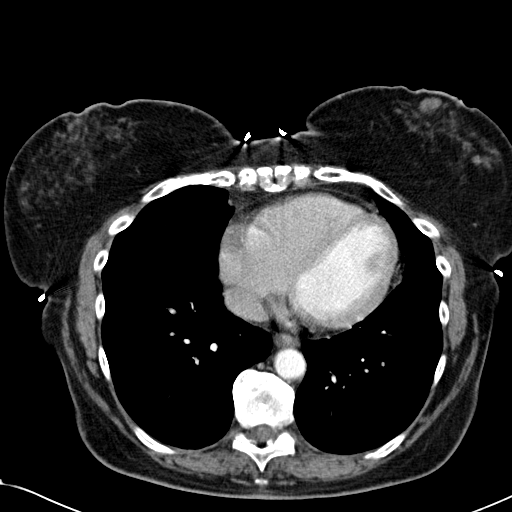
[im 24/62  lung]
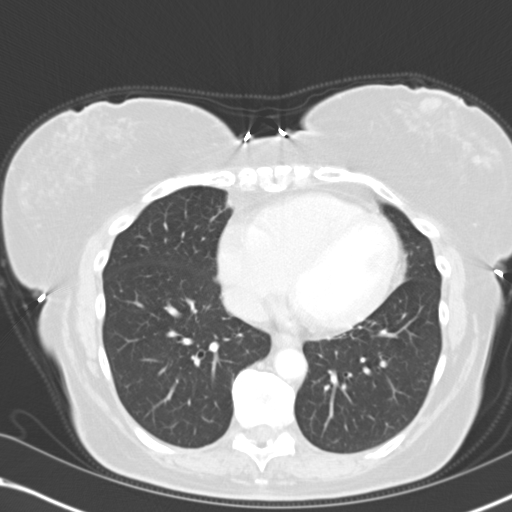
[im 29/62  lung]
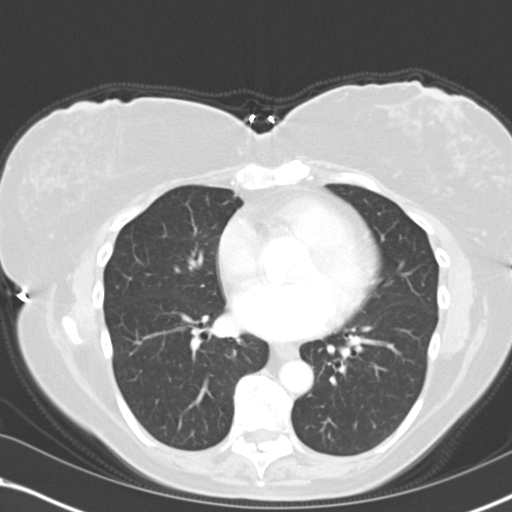
[im 33/62  lung]
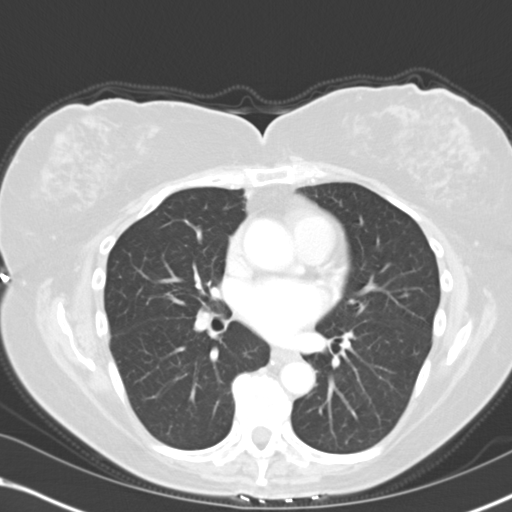
[im 38/62  lung]
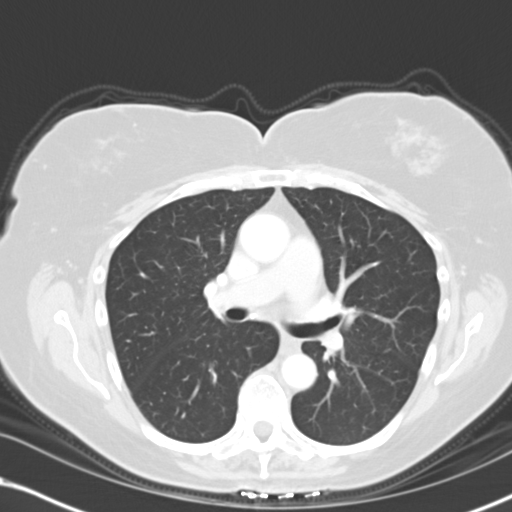
[im 43/62  mediastinal]
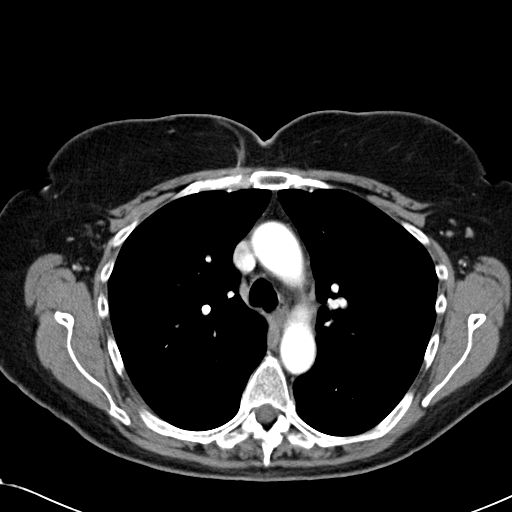
[im 43/62  lung]
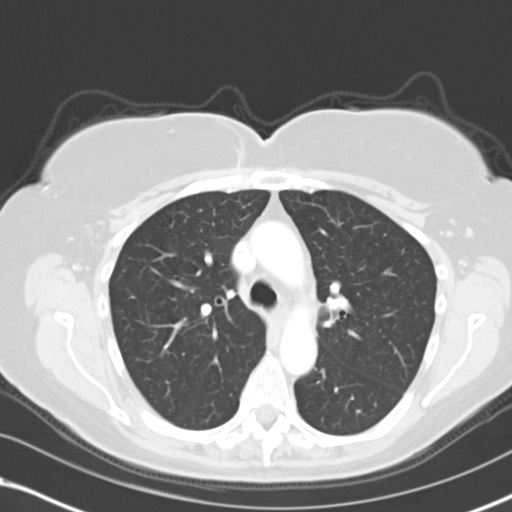
[im 47/62  lung]
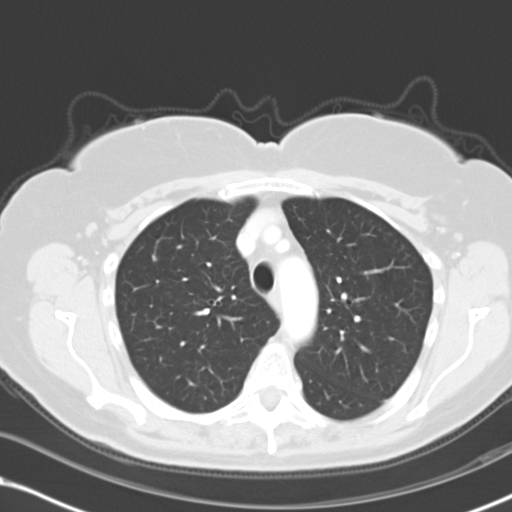
[im 52/62  lung]
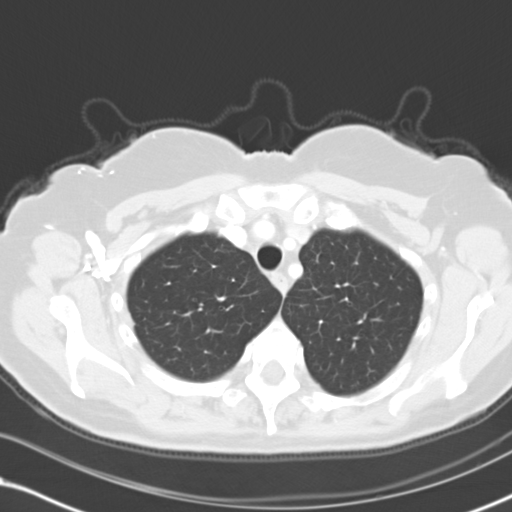
[im 57/62  lung]
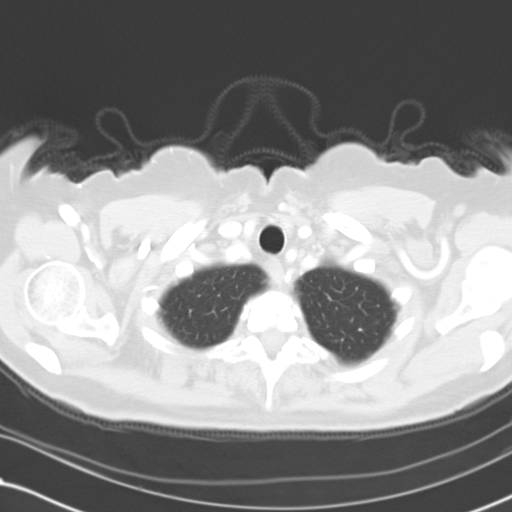

[Series 602: cor · coronal · 0.64mm/px · 3 of 95 slices shown]
[im 19/95  lung]
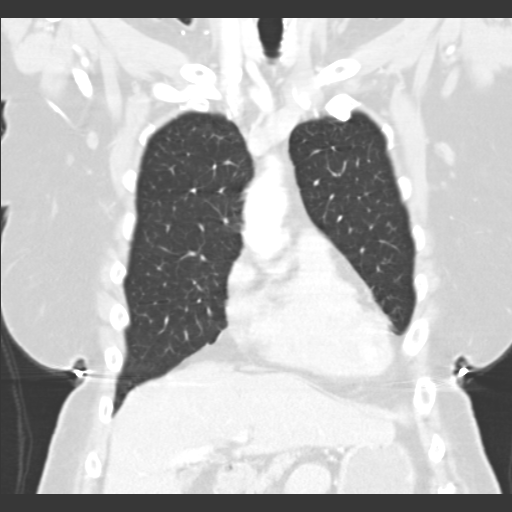
[im 38/95  lung]
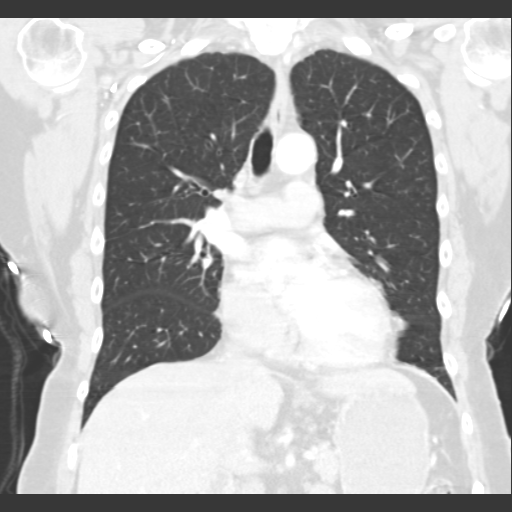
[im 57/95  lung]
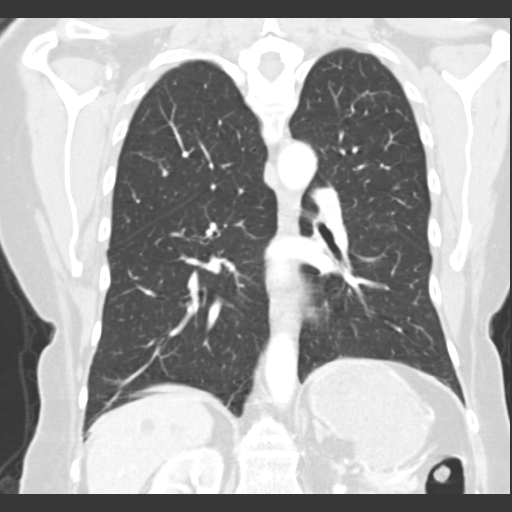

[15 of 36 positions shown; findings below may reference images not displayed]

FINDINGS: The supraclavicular fossa and thoracic inlet appear
normal.  The airways are normal.  The enhancement of the aorta and
pulmonary artery show no abnormalities.  There is a calcified
granuloma behind the right pulmonary artery.  There is no
lymphadenopathy.  Heart size is normal.  There is no pericardial
effusion.
The lungs are clear.  No effusions are noted.  The bony structures
show no abnormalities.  No evidence of trauma or bone destruction.
The scapulae appear normal.  There are no soft tissue
abnormalities.  The limited scans of the upper abdomen show a
number of probable small cyst in the liver and no additional
pathologic findings.
IMPRESSION: No specific abnormalities with reference to the history of left
shoulder blade pain.  CT of the chest is normal and shows no acute
findings.  Probable old granulomatous disease.  There appears to be
a number of tiny cysts in the liver.

## 2014-08-19 ENCOUNTER — Ambulatory Visit (INDEPENDENT_AMBULATORY_CARE_PROVIDER_SITE_OTHER): Payer: Medicare Other | Admitting: Cardiology

## 2014-08-19 ENCOUNTER — Encounter: Payer: Self-pay | Admitting: Cardiology

## 2014-08-19 VITALS — BP 110/60 | HR 74 | Ht 64.0 in | Wt 156.0 lb

## 2014-08-19 DIAGNOSIS — R079 Chest pain, unspecified: Secondary | ICD-10-CM

## 2014-08-19 DIAGNOSIS — E7800 Pure hypercholesterolemia, unspecified: Secondary | ICD-10-CM

## 2014-08-19 DIAGNOSIS — M545 Low back pain, unspecified: Secondary | ICD-10-CM | POA: Insufficient documentation

## 2014-08-19 DIAGNOSIS — K219 Gastro-esophageal reflux disease without esophagitis: Secondary | ICD-10-CM

## 2014-08-19 DIAGNOSIS — Z720 Tobacco use: Secondary | ICD-10-CM

## 2014-08-19 NOTE — Assessment & Plan Note (Signed)
The patient denies any recent chest discomfort at rest or with exertion.  She is getting more exercise and this has helped her to lose weight.  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  She also enjoys doing yard work at home.

## 2014-08-19 NOTE — Assessment & Plan Note (Signed)
The patient is still smoking between a half a pack and a third of a pack of cigarettes a day.  She is gradually trying to cut back.  She does not smoke in the house.

## 2014-08-19 NOTE — Progress Notes (Signed)
Sharon Robinson Date of Birth:  06/17/1946 [facility] [address] [place], [place]  [address] [phone]        Fax   [phone]   History of Present Illness: This pleasant 68 year old woman is seen for a scheduled 1 year followup office visit. . She has a past history of hypercholesterolemia. She continues on Lipitor 40 mg daily. She's not having myalgias or side effects from the Lipitor. She does not have any history of coronary disease. She had a normal stress Cardiolite in 2007. The patient has not been experiencing any substernal chest pain.  The patient had a spontaneous rupture of the left baker's cyst behind her left knee. This occurred while she was working in the garden last year. She saw orthopedist Dr. Erasmo Score who prescribed pain medication in the form of hydrocodone at bedtime and she takes Motrin during the day. She is still having moderate pain in the left leg from the ruptured Baker's cyst but the pain is improving slowly. She has been more physically active last in her weight is down 12 pounds.   Current Outpatient Prescriptions  Medication Sig Dispense Refill  . alendronate (FOSAMAX) 70 MG tablet Take 1 tablet (70 mg total) by mouth every 7 (seven) days. Take with a full glass of water on an empty stomach.  4 tablet  11  . atorvastatin (LIPITOR) 40 MG tablet TAKE 1 TABLET ONCE DAILY.  30 tablet  3  . cholecalciferol (VITAMIN D) 1000 UNITS tablet Take 1,000 Units by mouth daily.      . GuaiFENesin (MUCINEX PO) Take 600 mg by mouth as needed (for congestion). Takes prn      . montelukast (SINGULAIR) 10 MG tablet Take 10 mg by mouth at bedtime. As directed       . naproxen sodium (ANAPROX) 220 MG tablet Take 220 mg by mouth 2 (two) times daily with a meal.      . valACYclovir (VALTREX) 500 MG tablet Take 1 tablet (500 mg total) by mouth daily.  30 tablet  11   No current facility-administered medications for this visit.    No Known  Allergies  Patient Active Problem List   Diagnosis Date Noted  . Low back pain 08/19/2014  . OAB (overactive bladder) 04/16/2013  . Chest pain at rest 11/03/2012  . Tobacco abuse 08/26/2012  . DI (detrusor instability)   . STD (sexually transmitted disease)   . Osteopenia   . Hypercholesterolemia 08/20/2011  . Sinusitis   . GERD (gastroesophageal reflux disease)     History  Smoking status  . Current Every Day Smoker -- 0.50 packs/day  . Types: Cigarettes  Smokeless tobacco  . Not on file    History  Alcohol Use  . Yes    Comment: rare    Family History  Problem Relation Age of Onset  . Heart attack Mother 19  . Cancer Father 57    colon    Review of Systems: Constitutional: no fever chills diaphoresis or fatigue or change in weight.  Head and neck: no hearing loss, no epistaxis, no photophobia or visual disturbance. Respiratory: No cough, shortness of breath or wheezing. Cardiovascular: No chest pain peripheral edema, palpitations. Gastrointestinal: No abdominal distention, no abdominal pain, no change in bowel habits hematochezia or melena. Genitourinary: No dysuria, no frequency, no urgency, no nocturia. Musculoskeletal:No arthralgias, no back pain, no gait disturbance or myalgias. Neurological: No dizziness, no headaches, no numbness, no seizures, no  syncope, no weakness, no tremors. Hematologic: No lymphadenopathy, no easy bruising. Psychiatric: No confusion, no hallucinations, no sleep disturbance.    Physical Exam: Filed Vitals:   08/19/14 1414  BP: 110/60  Pulse: 74  The patient appears to be in no distress.  Head and neck exam reveals that the pupils are equal and reactive.  The extraocular movements are full.  There is no scleral icterus.  Mouth and pharynx are benign.  No lymphadenopathy.  No carotid bruits.  The jugular venous pressure is normal.  Thyroid is not enlarged or tender.  Chest is clear to percussion and auscultation.  No rales or  rhonchi.  Expansion of the chest is symmetrical.  Heart reveals no abnormal lift or heave.  First and second heart sounds are normal.  There is no murmur gallop rub or click.  The abdomen is soft and nontender.  Bowel sounds are normoactive.  There is no hepatosplenomegaly or mass.  There are no abdominal bruits.  Extremities reveal no phlebitis or edema.  Pedal pulses are good.  There is no cyanosis or clubbing.  Neurologic exam is normal strength and no lateralizing weakness.  No sensory deficits.  Integument reveals no rash  EKG shows normal sinus rhythm with occasional PVCs.  No ischemic changes.  Assessment / Plan: 1. Hypercholesterolemia 2. history of GERD 3. ongoing tobacco abuse.  Her last chest x-ray was in January 2014 and was stable. 4. chest pain at rest, resolved 5. PVCs, asymptomatic  Disposition: Continue current medication.  Recheck in one year for office visit EKG lipid panel hepatic function panel and basal metabolic panel

## 2014-08-19 NOTE — Assessment & Plan Note (Signed)
The patient is not having to take any regular medication for her GERD which is controlled with careful diet.

## 2014-08-19 NOTE — Patient Instructions (Signed)
Your physician recommends that you continue on your current medications as directed. Please refer to the Current Medication list given to you today.  Your physician wants you to follow-up in: 1 YEAR OV/LP/BMET/HFP/EKG  You will receive a reminder letter in the mail two months in advance. If you don't receive a letter, please call our office to schedule the follow-up appointment.

## 2014-08-19 NOTE — Assessment & Plan Note (Signed)
The patient has a history of hypercholesterolemia.  She is tolerating atorvastatin 40 mg daily without side effects.  Lab work today satisfactory

## 2014-08-20 ENCOUNTER — Other Ambulatory Visit: Payer: Self-pay | Admitting: Cardiology

## 2014-09-22 ENCOUNTER — Telehealth: Payer: Self-pay | Admitting: Cardiology

## 2014-09-22 NOTE — Telephone Encounter (Signed)
New Msg   Left vm for pt at home number to inform of flu clinic.

## 2014-10-22 HISTORY — PX: CATARACT EXTRACTION: SUR2

## 2015-01-19 ENCOUNTER — Encounter: Payer: Self-pay | Admitting: Gynecology

## 2015-04-19 ENCOUNTER — Other Ambulatory Visit: Payer: Self-pay | Admitting: Gynecology

## 2015-05-13 ENCOUNTER — Other Ambulatory Visit: Payer: Self-pay | Admitting: Gynecology

## 2015-05-31 ENCOUNTER — Other Ambulatory Visit: Payer: Self-pay | Admitting: Gynecology

## 2015-06-01 ENCOUNTER — Ambulatory Visit (INDEPENDENT_AMBULATORY_CARE_PROVIDER_SITE_OTHER): Payer: Medicare Other | Admitting: Gynecology

## 2015-06-01 ENCOUNTER — Encounter: Payer: Self-pay | Admitting: Gynecology

## 2015-06-01 VITALS — BP 138/82 | Ht 64.5 in | Wt 156.0 lb

## 2015-06-01 DIAGNOSIS — Z01419 Encounter for gynecological examination (general) (routine) without abnormal findings: Secondary | ICD-10-CM | POA: Diagnosis not present

## 2015-06-01 DIAGNOSIS — M8580 Other specified disorders of bone density and structure, unspecified site: Secondary | ICD-10-CM | POA: Diagnosis not present

## 2015-06-01 DIAGNOSIS — M533 Sacrococcygeal disorders, not elsewhere classified: Secondary | ICD-10-CM | POA: Insufficient documentation

## 2015-06-01 MED ORDER — IBUPROFEN 800 MG PO TABS: ORAL_TABLET | ORAL | Status: DC

## 2015-06-01 MED ORDER — PREDNISONE 10 MG (21) PO TBPK: 10.0000 mg | ORAL_TABLET | Freq: Every day | ORAL | Status: DC

## 2015-06-01 MED ORDER — VALACYCLOVIR HCL 500 MG PO TABS: 500.0000 mg | ORAL_TABLET | Freq: Every day | ORAL | Status: DC

## 2015-06-01 MED ORDER — ALENDRONATE SODIUM 70 MG PO TABS: 70.0000 mg | ORAL_TABLET | ORAL | Status: DC

## 2015-06-01 NOTE — Progress Notes (Signed)
Sharon Robinson [birth year] [id number]   History:    69 y.o.  for annual gyn exam complaining of pain in her tailbone since last year worse now. She denies any fractures or falls in the past.Patient has a history of osteopenia and states that she is now taking her calcium and vitamin D her bone density study done in our office August 2014 demonstrated that her lowest T. score was that the right femoral neck with a value of -2.1 her 10 year fracture risk (FRAX) demonstrated that her risk of hip fracture was 4% exceeding the threshold of 3%. Her major osteoporotic risk in the next 10 years is 13% which is below threshold which is considered above 20%. She is currently on Fosamax 70 mg every weekly.She continues to smoke but she states that she does not inhale. Dr. [name] is her PCP and has been treating her for hyperlipidemia and vitamin D deficiency. Patient with no prior history of abnormal Pap smear. Her colonoscopy was normal in 2009. Her father had history of colon cancer and she is overdue.   Past medical history,surgical history, family history and social history were all reviewed and documented in the EPIC chart.  Gynecologic History No LMP recorded. Patient is postmenopausal. Contraception: post menopausal status Last Pap: 2013. Results were: normal Last mammogram: 2016. Results were: normal  Obstetric History OB History  Gravida Para Term Preterm AB SAB TAB Ectopic Multiple Living  0                  ROS: A ROS was performed and pertinent positives and negatives are included in the history.  GENERAL: No fevers or chills. HEENT: No change in vision, no earache, sore throat or sinus congestion. NECK: No pain or stiffness. CARDIOVASCULAR: No chest pain or pressure. No palpitations. PULMONARY: No shortness of breath, cough or wheeze. GASTROINTESTINAL: No abdominal pain, nausea, vomiting or diarrhea, melena or bright red blood per rectum. GENITOURINARY: No urinary frequency, urgency,  hesitancy or dysuria. MUSCULOSKELETAL: Tender tailbone. DERMATOLOGIC: No rash, no itching, no lesions. ENDOCRINE: No polyuria, polydipsia, no heat or cold intolerance. No recent change in weight. HEMATOLOGICAL: No anemia or easy bruising or bleeding. NEUROLOGIC: No headache, seizures, numbness, tingling or weakness. PSYCHIATRIC: No depression, no loss of interest in normal activity or change in sleep pattern.     Exam: chaperone present  BP 138/82 mmHg  Ht 5' 4.5" (1.638 m)  Wt 156 lb (70.761 kg)  BMI 26.37 kg/m2  Body mass index is 26.37 kg/(m^2).  General appearance : Well developed well nourished female. No acute distress HEENT: Eyes: no retinal hemorrhage or exudates,  Neck supple, trachea midline, no carotid bruits, no thyroidmegaly Lungs: Clear to auscultation, no rhonchi or wheezes, or rib retractions  Heart: Regular rate and rhythm, no murmurs or gallops Breast:Examined in sitting and supine position were symmetrical in appearance, no palpable masses or tenderness,  no skin retraction, no nipple inversion, no nipple discharge, no skin discoloration, no axillary or supraclavicular lymphadenopathy Abdomen: no palpable masses or tenderness, no rebound or guarding Extremities: no edema or skin discoloration or tenderness Exquisitely tender coccyx  Pelvic:  Bartholin, Urethra, Skene Glands: Within normal limits             Vagina: No gross lesions or discharge, atrophic changes  Cervix: No gross lesions or discharge  Uterus  axial, normal size, shape and consistency, non-tender and mobile  Adnexa  Without masses or tenderness  Anus and perineum  normal  Rectovaginal  normal sphincter tone without palpated masses or tenderness             Hemoccult PCP provides     Assessment/Plan:  69 y.o. female for annual exam with coccydynia. Very tender coccyx on palpation. Patient will be sent for a PA and lateral of lower spine and coccyx to rule out fracture. She is going to be placed on  a prednisone (Sterapred-uni-pak) 10 mg to take over 6 day course. Also Motrin 800 mg take 1 by mouth 3 times a day for one week. Patient to schedule her bone density study for next month. We discussed importance of calcium vitamin D and weightbearing exercises. Prescription refill for her Fosamax was provided. Pap smear not indicated. PCP will be doing her blood work. We will check her vitamin D level today because past history vitamin D deficiency.   Terrance Mass MD, 12:10 PM 06/01/2015

## 2015-06-02 ENCOUNTER — Telehealth: Payer: Self-pay

## 2015-06-02 ENCOUNTER — Other Ambulatory Visit: Payer: Self-pay | Admitting: Gynecology

## 2015-06-02 ENCOUNTER — Encounter: Payer: Self-pay | Admitting: Gynecology

## 2015-06-02 DIAGNOSIS — E559 Vitamin D deficiency, unspecified: Secondary | ICD-10-CM

## 2015-06-02 LAB — VITAMIN D 25 HYDROXY (VIT D DEFICIENCY, FRACTURES): VIT D 25 HYDROXY: 27 ng/mL — AB (ref 30–100)

## 2015-06-02 MED ORDER — VITAMIN D (ERGOCALCIFEROL) 1.25 MG (50000 UNIT) PO CAPS: 50000.0000 [IU] | ORAL_CAPSULE | ORAL | Status: DC

## 2015-06-02 NOTE — Telephone Encounter (Signed)
-----   Message from Terrance Mass, MD sent at 06/02/2015  8:58 AM EDT ----- Please inform patient her vitamin D level was slightly below normal I would like her to increase her vitamin D from 1000 units daily to 2000 units daily

## 2015-06-02 NOTE — Telephone Encounter (Signed)
Patient advised. Rx sent and lab order placed.

## 2015-06-02 NOTE — Telephone Encounter (Signed)
See your recommendation below. Patient said she is already taking 2000 Units Vit D daily. What to rec?

## 2015-06-02 NOTE — Telephone Encounter (Signed)
Call in prescription for vitamin D 50,000 units Weekly for 12 weeks then return to the office to check her vitamin D level and then once again begin the vitamin D3 2000 units daily thereafter

## 2015-06-03 ENCOUNTER — Telehealth: Payer: Self-pay | Admitting: *Deleted

## 2015-06-03 ENCOUNTER — Ambulatory Visit (HOSPITAL_COMMUNITY)
Admission: RE | Admit: 2015-06-03 | Discharge: 2015-06-03 | Disposition: A | Discharge status: Home / Self Care | Payer: Medicare Other | Source: Ambulatory Visit | Attending: Gynecology | Admitting: Gynecology

## 2015-06-03 DIAGNOSIS — M4186 Other forms of scoliosis, lumbar region: Secondary | ICD-10-CM | POA: Insufficient documentation

## 2015-06-03 DIAGNOSIS — M47896 Other spondylosis, lumbar region: Secondary | ICD-10-CM | POA: Insufficient documentation

## 2015-06-03 DIAGNOSIS — M4316 Spondylolisthesis, lumbar region: Secondary | ICD-10-CM | POA: Insufficient documentation

## 2015-06-03 DIAGNOSIS — M545 Low back pain: Secondary | ICD-10-CM | POA: Diagnosis present

## 2015-06-03 DIAGNOSIS — M533 Sacrococcygeal disorders, not elsewhere classified: Secondary | ICD-10-CM

## 2015-06-03 NOTE — Telephone Encounter (Signed)
Order placed for x-ray, pt doesn't need appointment can go anytime at [facility] radiology department. Pt aware.

## 2015-06-03 NOTE — Telephone Encounter (Signed)
-----   Message from Terrance Mass, MD sent at 06/01/2015 11:58 AM EDT ----- Please schedule lower spine PA/Lat xray to include coccyx. Patient with excruciating coccydynia.

## 2015-06-30 ENCOUNTER — Other Ambulatory Visit: Payer: Self-pay | Admitting: Gynecology

## 2015-06-30 ENCOUNTER — Ambulatory Visit (INDEPENDENT_AMBULATORY_CARE_PROVIDER_SITE_OTHER): Payer: Medicare Other

## 2015-06-30 DIAGNOSIS — M899 Disorder of bone, unspecified: Secondary | ICD-10-CM

## 2015-06-30 DIAGNOSIS — M8580 Other specified disorders of bone density and structure, unspecified site: Secondary | ICD-10-CM

## 2015-06-30 DIAGNOSIS — F17200 Nicotine dependence, unspecified, uncomplicated: Secondary | ICD-10-CM

## 2015-06-30 DIAGNOSIS — Z720 Tobacco use: Secondary | ICD-10-CM | POA: Diagnosis not present

## 2015-11-29 ENCOUNTER — Other Ambulatory Visit: Payer: Self-pay | Admitting: Cardiology

## 2015-12-29 ENCOUNTER — Other Ambulatory Visit: Payer: Self-pay | Admitting: Cardiology

## 2016-01-05 ENCOUNTER — Ambulatory Visit (INDEPENDENT_AMBULATORY_CARE_PROVIDER_SITE_OTHER): Payer: Medicare Other | Admitting: Cardiology

## 2016-01-05 ENCOUNTER — Encounter: Payer: Self-pay | Admitting: Cardiology

## 2016-01-05 VITALS — BP 124/60 | HR 65 | Ht 64.5 in | Wt 160.8 lb

## 2016-01-05 DIAGNOSIS — K219 Gastro-esophageal reflux disease without esophagitis: Secondary | ICD-10-CM

## 2016-01-05 DIAGNOSIS — Z720 Tobacco use: Secondary | ICD-10-CM

## 2016-01-05 DIAGNOSIS — R079 Chest pain, unspecified: Secondary | ICD-10-CM

## 2016-01-05 DIAGNOSIS — E7800 Pure hypercholesterolemia, unspecified: Secondary | ICD-10-CM | POA: Diagnosis not present

## 2016-01-05 LAB — LIPID PANEL
CHOLESTEROL: 133 mg/dL (ref 125–200)
HDL: 53 mg/dL (ref >=46)
LDL CALC: 59 mg/dL (ref <130)
TRIGLYCERIDES: 107 mg/dL (ref <150)
Total CHOL/HDL Ratio: 2.5 Ratio (ref <=5.0)
VLDL: 21 mg/dL (ref <30)

## 2016-01-05 LAB — ALT: ALT: 17 U/L (ref 6–29)

## 2016-01-05 NOTE — Patient Instructions (Addendum)
Medication Instructions:  Your physician recommends that you continue on your current medications as directed. Please refer to the Current Medication list given to you today.   Labwork: TODAY:   LIPID & ALT  Testing/Procedures: None ordered  Follow-Up: Your physician recommends that you schedule a follow-up appointment in:  [place].  You will receive a letter 2 months prior to the year, when you receive that letter, call our office and schedule at that time.  Any Other Special Instructions Will Be Listed Below (If Applicable).     If you need a refill on your cardiac medications before your next appointment, please call your pharmacy.

## 2016-01-05 NOTE — Progress Notes (Signed)
Cardiology Office Note    Date:  01/05/2016   ID:  Sharon Robinson, DOB 01-18-1946, MRN [id number]  PCP:  No PCP Per Patient  Cardiologist:   Candee Furbish, MD     History of Present Illness:  Sharon Robinson is a 70 y.o. female here for one year follow up hyperlipidemia with Lipitor use.  No myalgias on Lipitor  No CAD history  Prior rupture of Baker's cyst left knee when working in the garden.  Has history of PVC as well.  Dr. Jerilee Hoh (OB/?PCP now)   Minor dizziness. Allergy pills OTC Dry.   Tailbone pain, leg pain. Getting a mammogram.  Understands that she needs to establish with a primary physician.    Past Medical History  Diagnosis Date  . Hyperlipidemia   . Sinusitis     related to smoking  . GERD (gastroesophageal reflux disease)   . DI (detrusor instability)   . STD (sexually transmitted disease)     HSV 2  . Osteopenia   . H/O vitamin D deficiency     Past Surgical History  Procedure Laterality Date  . Other surgical history      T&A age 53  . Other surgical history  1989    tubal ligation  . Tubal ligation    . Tonsillectomy    . Cataract extraction Left     Jun 20 2015    Outpatient Prescriptions Prior to Visit  Medication Sig Dispense Refill  . alendronate (FOSAMAX) 70 MG tablet Take 1 tablet (70 mg total) by mouth once a week. Take with a full glass of water on an empty stomach. 4 tablet 11  . atorvastatin (LIPITOR) 40 MG tablet TAKE 1 TABLET ONCE DAILY. 30 tablet 0  . cholecalciferol (VITAMIN D) 1000 UNITS tablet Take 1,000 Units by mouth daily.    . GuaiFENesin (MUCINEX PO) Take 600 mg by mouth as needed (for congestion). Takes prn    . ibuprofen (ADVIL,MOTRIN) 800 MG tablet Take 13 times a day with meals for one week 30 tablet 1  . montelukast (SINGULAIR) 10 MG tablet Take 10 mg by mouth at bedtime. As directed     . naproxen sodium (ANAPROX) 220 MG tablet Take 220 mg by mouth 2 (two) times daily with a meal.    . valACYclovir  (VALTREX) 500 MG tablet Take 1 tablet (500 mg total) by mouth daily. 30 tablet 11  . Vitamin D, Ergocalciferol, (DRISDOL) 50000 UNITS CAPS capsule Take 1 capsule (50,000 Units total) by mouth every 7 (seven) days. 12 capsule 0  . predniSONE (STERAPRED UNI-PAK 21 TAB) 10 MG (21) TBPK tablet Take 1 tablet (10 mg total) by mouth daily. 10 Mg Steroid Pak for 6 days #21 21 tablet 0   No facility-administered medications prior to visit.     Allergies:   Review of patient's allergies indicates no known allergies.   Social History   Social History  . Marital Status: Single    Spouse Name: N/A  . Number of Children: N/A  . Years of Education: N/A   Social History Main Topics  . Smoking status: Current Every Day Smoker -- 0.50 packs/day    Types: Cigarettes  . Smokeless tobacco: None  . Alcohol Use: 0.0 oz/week    0 Standard drinks or equivalent per week     Comment: rare  . Drug Use: No  . Sexual Activity: No   Other Topics Concern  . None   Social History Narrative  Family History:  The patient's family history includes Cancer (age of onset: 52) in her father; Heart attack (age of onset: 24) in her mother.   ROS:   Please see the history of present illness.    ROS All other systems reviewed and are negative.   PHYSICAL EXAM:   VS:  BP 124/60 mmHg  Pulse 65  Ht 5' 4.5" (1.638 m)  Wt 160 lb 12.8 oz (72.938 kg)  BMI 27.18 kg/m2   GEN: Well nourished, well developed, in no acute distress HEENT: normal Neck: no JVD, carotid bruits, or masses Cardiac: RRR; no murmurs, rubs, or gallops,no edema  Respiratory:  clear to auscultation bilaterally, normal work of breathing GI: soft, nontender, nondistended, + BS MS: no deformity or atrophy Skin: warm and dry, no rash Neuro:  Alert and Oriented x 3, Strength and sensation are intact Psych: euthymic mood, full affect  Wt Readings from Last 3 Encounters:  01/05/16 160 lb 12.8 oz (72.938 kg)  06/01/15 156 lb (70.761 kg)    08/19/14 156 lb (70.761 kg)      Studies/Labs Reviewed:   EKG:  EKG is ordered today.  The ekg ordered today demonstrates  01/05/16-sinus rhythm, 65, no other abnormality.  Recent Labs: No results found for requested labs within last 365 days.   Lipid Panel    Component Value Date/Time   CHOL 122 08/16/2014 1036   TRIG 56.0 08/16/2014 1036   HDL 47.10 08/16/2014 1036   CHOLHDL 3 08/16/2014 1036   VLDL 11.2 08/16/2014 1036   LDLCALC 64 08/16/2014 1036    Additional studies/ records that were reviewed today include:  Prior office notes, labs and other data reviewed    ASSESSMENT:    1. Hypercholesterolemia   2. Gastroesophageal reflux disease without esophagitis   3. Tobacco abuse   4. Chest pain at rest      PLAN:  In order of problems listed above:  Hyperlipidemia -She is tolerating atorvastatin 40 mg daily without side effects. -Has previously been followed by our clinic with lipid panel.  Tobacco use -half a pack and a third of a pack of cigarettes a day. She is gradually trying to cut back. She does not smoke in the house.She states that she does not inhale. -encouraged cessation  Chest pain -denies any recent chest discomfort at rest or with exertion. She is getting more exercise and this has helped her to lose weight. 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 She also enjoys doing yard work at home  GERD -not having to take any regular medication for her GERD which is controlled with careful diet  Explained to her that she needs to establish with a primary physician.     Medication Adjustments/Labs and Tests Ordered: Current medicines are reviewed at length with the patient today.  Concerns regarding medicines are outlined above.  Medication changes, Labs and Tests ordered today are listed in the Patient Instructions below. Patient Instructions  Medication  Instructions:  Your physician recommends that you continue on your current medications as directed. Please refer to the Current Medication list given to you today.   Labwork: TODAY:   LIPID & ALT  Testing/Procedures: None ordered  Follow-Up: Your physician recommends that you schedule a follow-up appointment in:  [place].  You will receive a letter 2 months prior to the year, when you receive that letter,  call our office and schedule at that time.  Any Other Special Instructions Will Be Listed Below (If Applicable).     If you need a refill on your cardiac medications before your next appointment, please call your pharmacy.         Bobby Rumpf, MD  01/05/2016 10:57 AM    [address], [place], [place]  [address] Phone: [phone]; Fax: [phone]

## 2016-01-28 ENCOUNTER — Other Ambulatory Visit: Payer: Self-pay | Admitting: Cardiology

## 2016-02-01 ENCOUNTER — Encounter: Payer: Self-pay | Admitting: Gynecology

## 2016-05-15 ENCOUNTER — Other Ambulatory Visit: Payer: Self-pay | Admitting: Gynecology

## 2016-06-06 ENCOUNTER — Encounter: Payer: Self-pay | Admitting: Gynecology

## 2016-06-06 ENCOUNTER — Ambulatory Visit (INDEPENDENT_AMBULATORY_CARE_PROVIDER_SITE_OTHER): Payer: Medicare Other | Admitting: Gynecology

## 2016-06-06 VITALS — BP 134/78 | Ht 64.5 in | Wt 156.0 lb

## 2016-06-06 DIAGNOSIS — Z01419 Encounter for gynecological examination (general) (routine) without abnormal findings: Secondary | ICD-10-CM

## 2016-06-06 DIAGNOSIS — M8580 Other specified disorders of bone density and structure, unspecified site: Secondary | ICD-10-CM

## 2016-06-06 DIAGNOSIS — Z8639 Personal history of other endocrine, nutritional and metabolic disease: Secondary | ICD-10-CM | POA: Diagnosis not present

## 2016-06-06 MED ORDER — ALENDRONATE SODIUM 70 MG PO TABS: 70.0000 mg | ORAL_TABLET | ORAL | 11 refills | Status: DC

## 2016-06-06 NOTE — Progress Notes (Signed)
Sharon Robinson [birth year] [id number]   History:    70 y.o.  for annual gyn exam who review of her record indicated she had history of vitamin D deficiency last year was treated with vitamin D 50,000 units every weekly for 3 months but did not return for vitamin D level until today. She appears to be taking only 400 units of vitamin D daily now.Patient has a history of osteopenia and states that she is now taking her calcium and vitamin D her bone density study done in our office August 2014 demonstrated that her lowest T. score was that the right femoral neck with a value of -2.1 her 10 year fracture risk (FRAX) demonstrated that her risk of hip fracture was 4% exceeding the threshold of 3%. Her major osteoporotic risk in the next 10 years is 13% which is below threshold which is considered above 20%. She is currently on Fosamax 70 mg every weekly.She continues to smoke but she states that she does not inhale. Dr. [name] is her PCP and has been treating her for hyperlipidemia patient had a normal colonoscopy in 2009. Patient's father had a history of colon cancer.  Past medical history,surgical history, family history and social history were all reviewed and documented in the EPIC chart.  Gynecologic History No LMP recorded. Patient is postmenopausal. Contraception: post menopausal status Last Pap: 2013. Results were: normal Last mammogram: 2017. Results were: Normal but dense had three-dimensional mammogram  Obstetric History OB History  Gravida Para Term Preterm AB Living  0            SAB TAB Ectopic Multiple Live Births                    ROS: A ROS was performed and pertinent positives and negatives are included in the history.  GENERAL: No fevers or chills. HEENT: No change in vision, no earache, sore throat or sinus congestion. NECK: No pain or stiffness. CARDIOVASCULAR: No chest pain or pressure. No palpitations. PULMONARY: No shortness of breath, cough or wheeze.  GASTROINTESTINAL: No abdominal pain, nausea, vomiting or diarrhea, melena or bright red blood per rectum. GENITOURINARY: No urinary frequency, urgency, hesitancy or dysuria. MUSCULOSKELETAL: No joint or muscle pain, no back pain, no recent trauma. DERMATOLOGIC: No rash, no itching, no lesions. ENDOCRINE: No polyuria, polydipsia, no heat or cold intolerance. No recent change in weight. HEMATOLOGICAL: No anemia or easy bruising or bleeding. NEUROLOGIC: No headache, seizures, numbness, tingling or weakness. PSYCHIATRIC: No depression, no loss of interest in normal activity or change in sleep pattern.     Exam: chaperone present  BP 134/78   Ht 5' 4.5" (1.638 m)   Wt 156 lb (70.8 kg)   BMI 26.36 kg/m   Body mass index is 26.36 kg/m.  General appearance : Well developed well nourished female. No acute distress HEENT: Eyes: no retinal hemorrhage or exudates,  Neck supple, trachea midline, no carotid bruits, no thyroidmegaly Lungs: Clear to auscultation, no rhonchi or wheezes, or rib retractions  Heart: Regular rate and rhythm, no murmurs or gallops Breast:Examined in sitting and supine position were symmetrical in appearance, no palpable masses or tenderness,  no skin retraction, no nipple inversion, no nipple discharge, no skin discoloration, no axillary or supraclavicular lymphadenopathy Abdomen: no palpable masses or tenderness, no rebound or guarding Extremities: no edema or skin discoloration or tenderness  Pelvic:  Bartholin, Urethra, Skene Glands: Within normal limits  Vagina: No gross lesions or discharge, atrophic changes  Cervix: No gross lesions or discharge  Uterus  axial, normal size, shape and consistency, non-tender and mobile  Adnexa  Without masses or tenderness  Anus and perineum  normal   Rectovaginal  normal sphincter tone without palpated masses or tenderness             Hemoccult PCP provides     Assessment/Plan:  70 y.o. female for annual exam with  history of osteopenia high-risk for fracture based on Frax analysis was started on Fosamax one year a bone density study scheduled in October. Because of her history vitamin D deficiency will check a vitamin D level today. She was instructed to take 2000 units daily instead of 400 units daily. We will await for the results in the event that she may need adjustment in her therapy. Pap smear not indicated. Discussed importance of calcium vitamin D and weightbearing exercises for osteoporosis prevention. We also discussed importance of monthly breast exam. Her PCP we'll be doing her blood work.   Terrance Mass MD, 2:30 PM 06/06/2016

## 2016-06-06 NOTE — Patient Instructions (Signed)
Vitamin D Deficiency Vitamin D deficiency is when your body does not have enough vitamin D. Vitamin D is important to your body for many reasons:  It helps the body to absorb two important minerals, called calcium and phosphorus.  It plays a role in bone health.  It may help to prevent some diseases, such as diabetes and multiple sclerosis.  It plays a role in muscle function, including heart function. You can get vitamin D by:  Eating foods that naturally contain vitamin D.  Eating or drinking milk or other dairy products that have vitamin D added to them.  Taking a vitamin D supplement or a multivitamin supplement that contains vitamin D.  Being in the sun. Your body naturally makes vitamin D when your skin is exposed to sunlight. Your body changes the sunlight into a form of the vitamin that the body can use. If vitamin D deficiency is severe, it can cause a condition in which your bones become soft. In adults, this condition is called osteomalacia. In children, this condition is called rickets. CAUSES Vitamin D deficiency may be caused by:  Not eating enough foods that contain vitamin D.  Not getting enough sun exposure.  Having certain digestive system diseases that make it difficult for your body to absorb vitamin D. These diseases include Crohn disease, chronic pancreatitis, and cystic fibrosis.  Having a surgery in which a part of the stomach or a part of the small intestine is removed.  Being obese.  Having chronic kidney disease or liver disease. RISK FACTORS This condition is more likely to develop in:  Older people.  People who do not spend much time outdoors.  People who live in a long-term care facility.  People who have had broken bones.  People with weak or thin bones (osteoporosis).  People who have a disease or condition that changes how the body absorbs vitamin D.  People who have dark skin.  People who take certain medicines, such as steroid  medicines or certain seizure medicines.  People who are overweight or obese. SYMPTOMS In mild cases of vitamin D deficiency, there may not be any symptoms. If the condition is severe, symptoms may include:  Bone pain.  Muscle pain.  Falling often.  Broken bones caused by a minor injury. DIAGNOSIS This condition is usually diagnosed with a blood test.  TREATMENT Treatment for this condition may depend on what caused the condition. Treatment options include:  Taking vitamin D supplements.  Taking a calcium supplement. Your health care provider will suggest what dose is best for you. HOME CARE INSTRUCTIONS  Take medicines and supplements only as told by your health care provider.  Eat foods that contain vitamin D. Choices include:  Fortified dairy products, cereals, or juices. Fortified means that vitamin D has been added to the food. Check the label on the package to be sure.  Fatty fish, such as salmon or trout.  Eggs.  Oysters.  Do not use a tanning bed.  Maintain a healthy weight. Lose weight, if needed.  Keep all follow-up visits as told by your health care provider. This is important. SEEK MEDICAL CARE IF:  Your symptoms do not go away.  You feel like throwing up (nausea) or you throw up (vomit).  You have fewer bowel movements than usual or it is difficult for you to have a bowel movement (constipation).   This information is not intended to replace advice given to you by your health care provider. Make sure you discuss  any questions you have with your health care provider.   Document Released: 12/31/2011 Document Revised: 06/29/2015 Document Reviewed: 02/23/2015 Elsevier Interactive Patient Education [employer].

## 2016-06-07 LAB — VITAMIN D 25 HYDROXY (VIT D DEFICIENCY, FRACTURES): Vit D, 25-Hydroxy: 26 ng/mL — ABNORMAL LOW (ref 30–100)

## 2016-06-11 ENCOUNTER — Other Ambulatory Visit: Payer: Self-pay | Admitting: Gynecology

## 2016-06-11 DIAGNOSIS — E559 Vitamin D deficiency, unspecified: Secondary | ICD-10-CM

## 2016-06-11 MED ORDER — VITAMIN D (ERGOCALCIFEROL) 1.25 MG (50000 UNIT) PO CAPS: 50000.0000 [IU] | ORAL_CAPSULE | ORAL | 0 refills | Status: DC

## 2016-07-05 ENCOUNTER — Other Ambulatory Visit: Payer: Self-pay | Admitting: Gynecology

## 2016-08-13 ENCOUNTER — Ambulatory Visit (INDEPENDENT_AMBULATORY_CARE_PROVIDER_SITE_OTHER): Payer: Medicare Other

## 2016-08-13 ENCOUNTER — Other Ambulatory Visit: Payer: Self-pay | Admitting: Gynecology

## 2016-08-13 DIAGNOSIS — M8580 Other specified disorders of bone density and structure, unspecified site: Secondary | ICD-10-CM | POA: Diagnosis not present

## 2016-08-13 DIAGNOSIS — M8589 Other specified disorders of bone density and structure, multiple sites: Secondary | ICD-10-CM

## 2016-08-13 DIAGNOSIS — M899 Disorder of bone, unspecified: Secondary | ICD-10-CM

## 2016-08-13 DIAGNOSIS — Z8639 Personal history of other endocrine, nutritional and metabolic disease: Secondary | ICD-10-CM

## 2016-09-04 ENCOUNTER — Other Ambulatory Visit: Payer: Medicare Other

## 2016-09-04 DIAGNOSIS — E559 Vitamin D deficiency, unspecified: Secondary | ICD-10-CM

## 2016-09-05 LAB — VITAMIN D 25 HYDROXY (VIT D DEFICIENCY, FRACTURES): Vit D, 25-Hydroxy: 82 ng/mL (ref 30–100)

## 2016-11-05 ENCOUNTER — Other Ambulatory Visit: Payer: Self-pay | Admitting: Gynecology

## 2016-11-06 ENCOUNTER — Telehealth: Payer: Self-pay | Admitting: Cardiology

## 2016-11-06 NOTE — Telephone Encounter (Signed)
The PNA vaccine is recommended for all adults >73 years of age.  Candee Furbish, MD

## 2016-11-06 NOTE — Telephone Encounter (Signed)
New Message     Does she need to get pneumonia vaccination?  Does not have a PCP

## 2016-11-06 NOTE — Telephone Encounter (Signed)
Will forward to Dr.Skains to see if he feels pt should get PNA shot.

## 2016-11-06 NOTE — Telephone Encounter (Signed)
Left message to call back  

## 2016-11-12 NOTE — Telephone Encounter (Signed)
Left message for patient current recommendations are for pt older than 65 to receive PNA.  Requested she c/b if further questions or concerns.

## 2016-12-25 ENCOUNTER — Ambulatory Visit (INDEPENDENT_AMBULATORY_CARE_PROVIDER_SITE_OTHER): Payer: Medicare Other | Admitting: Nurse Practitioner

## 2016-12-25 ENCOUNTER — Encounter: Payer: Self-pay | Admitting: Nurse Practitioner

## 2016-12-25 VITALS — BP 110/60 | HR 69 | Temp 97.5°F | Ht 64.5 in | Wt 159.0 lb

## 2016-12-25 DIAGNOSIS — J329 Chronic sinusitis, unspecified: Secondary | ICD-10-CM

## 2016-12-25 DIAGNOSIS — Z1159 Encounter for screening for other viral diseases: Secondary | ICD-10-CM

## 2016-12-25 DIAGNOSIS — E7800 Pure hypercholesterolemia, unspecified: Secondary | ICD-10-CM

## 2016-12-25 DIAGNOSIS — M8589 Other specified disorders of bone density and structure, multiple sites: Secondary | ICD-10-CM | POA: Diagnosis not present

## 2016-12-25 DIAGNOSIS — Z23 Encounter for immunization: Secondary | ICD-10-CM

## 2016-12-25 DIAGNOSIS — H6123 Impacted cerumen, bilateral: Secondary | ICD-10-CM | POA: Diagnosis not present

## 2016-12-25 DIAGNOSIS — Z0000 Encounter for general adult medical examination without abnormal findings: Secondary | ICD-10-CM

## 2016-12-25 MED ORDER — CARBAMIDE PEROXIDE 6.5 % OT SOLN: 5.0000 [drp] | Freq: Two times a day (BID) | OTIC | 0 refills | Status: DC | PRN

## 2016-12-25 MED ORDER — CETIRIZINE HCL 10 MG PO TABS: 10.0000 mg | ORAL_TABLET | Freq: Every day | ORAL | 30 refills | Status: AC

## 2016-12-25 NOTE — Progress Notes (Signed)
Subjective:  Patient ID: Sharon Robinson, female    DOB: [birth year]  Age: 71 y.o. MRN: [id number]  CC: New Patient (Initial Visit)   HPI   Hyperlipiddermia: Controlled with lipitor. Denies any adverse effects.  managed by cardiologist  Osteoporosis: No adverse effects with fosamax and vitamin D. No calcium supplement. Last dexa scan 07/2016: osteopenia.  Last vision 10/2016, bilateral cataracts removed.  Home alone. Single, no children, has extend family member close by.  AWV done by [facility] RN who comes to home.  Outpatient Medications Prior to Visit  Medication Sig Dispense Refill  . alendronate (FOSAMAX) 70 MG tablet Take 1 tablet (70 mg total) by mouth once a week. Take with a full glass of water on an empty stomach. 4 tablet 11  . atorvastatin (LIPITOR) 40 MG tablet Take 1 tablet (40 mg total) by mouth daily. 30 tablet 11  . cholecalciferol (VITAMIN D) 1000 UNITS tablet Take 1,000 Units by mouth daily.    Marland Kitchen ibuprofen (ADVIL,MOTRIN) 800 MG tablet Take 13 times a day with meals for one week 30 tablet 1  . naproxen sodium (ANAPROX) 220 MG tablet Take 220 mg by mouth 2 (two) times daily with a meal.    . valACYclovir (VALTREX) 500 MG tablet TAKE 1 TABLET ONCE DAILY. 30 tablet 5  . montelukast (SINGULAIR) 10 MG tablet Take 10 mg by mouth at bedtime. As directed     . GuaiFENesin (MUCINEX PO) Take 600 mg by mouth as needed (for congestion). Takes prn    . Vitamin D, Ergocalciferol, (DRISDOL) 50000 UNITS CAPS capsule Take 1 capsule (50,000 Units total) by mouth every 7 (seven) days. (Patient not taking: Reported on 06/06/2016) 12 capsule 0  . Vitamin D, Ergocalciferol, (DRISDOL) 50000 units CAPS capsule Take 1 capsule (50,000 Units total) by mouth every 7 (seven) days. 12 capsule 0   No facility-administered medications prior to visit.     ROS Review of Systems  Constitutional: Negative for fever, malaise/fatigue and weight loss.  HENT: Negative for congestion and sore  throat.   Eyes:       Negative for visual changes  Respiratory: Negative for cough and shortness of breath.   Cardiovascular: Negative for chest pain, palpitations and leg swelling.  Gastrointestinal: Negative for blood in stool, constipation, diarrhea and heartburn.  Genitourinary: Negative for dysuria, frequency and urgency.  Musculoskeletal: Negative for falls, joint pain and myalgias.  Skin: Negative for rash.  Neurological: Negative for dizziness, sensory change and headaches.  Endo/Heme/Allergies: Does not bruise/bleed easily.  Psychiatric/Behavioral: Negative for depression, substance abuse and suicidal ideas. The patient is not nervous/anxious and does not have insomnia.     Objective:  BP 110/60 (BP Location: Right Arm, Patient Position: Sitting, Cuff Size: Normal)   Pulse 69   Temp 97.5 F (36.4 C) (Oral)   Ht 5' 4.5" (1.638 m)   Wt 159 lb (72.1 kg)   SpO2 97%   BMI 26.87 kg/m   BP Readings from Last 3 Encounters:  12/25/16 110/60  06/06/16 134/78  01/05/16 124/60    Wt Readings from Last 3 Encounters:  12/25/16 159 lb (72.1 kg)  06/06/16 156 lb (70.8 kg)  01/05/16 160 lb 12.8 oz (72.9 kg)    Physical Exam  Constitutional: She is oriented to person, place, and time. No distress.  HENT:  Right Ear: External ear normal.  Left Ear: External ear normal.  Nose: Nose normal.  Mouth/Throat: Oropharynx is clear and moist. No oropharyngeal exudate.  Eyes:  No scleral icterus.  Neck: Normal range of motion. Neck supple. No thyromegaly present.  Cardiovascular: Normal rate, regular rhythm and normal heart sounds.   Pulmonary/Chest: Effort normal and breath sounds normal. No respiratory distress.  Musculoskeletal: Normal range of motion. She exhibits edema. She exhibits no tenderness.  Lymphadenopathy:    She has no cervical adenopathy.  Neurological: She is alert and oriented to person, place, and time.  Skin: Skin is warm and dry. No erythema.  Vitals  reviewed.   Lab Results  Component Value Date   WBC 7.0 08/18/2013   HGB 13.5 08/18/2013   HCT 39.8 08/18/2013   PLT 327.0 08/18/2013   GLUCOSE 93 08/16/2014   CHOL 133 01/05/2016   TRIG 107 01/05/2016   HDL 53 01/05/2016   LDLCALC 59 01/05/2016   ALT 17 01/05/2016   AST 20 08/16/2014   NA 140 08/16/2014   K 4.0 08/16/2014   CL 106 08/16/2014   CREATININE 0.8 08/16/2014   BUN 18 08/16/2014   CO2 31 08/16/2014    Dg Lumbar Spine 2-3 Views  Result Date: 06/03/2015 CLINICAL DATA:  Pain.  Initial evaluation. EXAM: LUMBAR SPINE - 2-3 VIEW COMPARISON:  04/19/2009. FINDINGS: Soft structures are unremarkable. Mild scoliosis concave right. No acute bony abnormality identified. Multilevel degenerative change present. 4 mm anterolisthesis L4 on L5 . IMPRESSION: Mild scoliosis concave right with diffuse degenerative change. 4 mm anterolisthesis L4 on L5. Electronically Signed   By: Sharon Robinson  Register   On: 06/03/2015 14:46    Assessment & Plan:   Sharon Robinson was seen today for new patient (initial visit).  Diagnoses and all orders for this visit:  Encounter for medical examination to establish care -     CBC w/Diff; Future -     Comprehensive metabolic panel -     TSH; Future -     Lipid panel; Future -     Hepatitis C Antibody; Future  Osteopenia of multiple sites  Hypercholesterolemia -     Comprehensive metabolic panel  Encounter for hepatitis C screening test for low risk patient -     Hepatitis C Antibody; Future  Chronic sinusitis, unspecified location -     cetirizine (ZYRTEC ALLERGY) 10 MG tablet; Take 1 tablet (10 mg total) by mouth daily.  Bilateral impacted cerumen -     carbamide peroxide (DEBROX) 6.5 % otic solution; Place 5 drops into both ears 2 (two) times daily as needed.  Need for pneumococcal vaccine -     Pneumococcal conjugate vaccine 13-valent   I have discontinued Sharon Robinson's montelukast, Vitamin D (Ergocalciferol), and Vitamin D (Ergocalciferol). I am  also having her start on cetirizine and carbamide peroxide. Additionally, I am having her maintain her GuaiFENesin (MUCINEX PO), naproxen sodium, cholecalciferol, ibuprofen, atorvastatin, alendronate, and valACYclovir.  Meds ordered this encounter  Medications  . cetirizine (ZYRTEC ALLERGY) 10 MG tablet    Sig: Take 1 tablet (10 mg total) by mouth daily.    Dispense:  30 tablet    Refill:  30    Order Specific Question:   Supervising Provider    Answer:   Cassandria Anger [[id number]]  . carbamide peroxide (DEBROX) 6.5 % otic solution    Sig: Place 5 drops into both ears 2 (two) times daily as needed.    Dispense:  15 mL    Refill:  0    Order Specific Question:   Supervising Provider    Answer:   Cassandria Anger [[id number]]  Follow-up: Return in about 6 months (around 06/27/2017) for hyperlipidemia.  Wilfred Lacy, NP

## 2016-12-25 NOTE — Patient Instructions (Signed)
Inquire with insurance about traditional colonoscopy and Cologuard.

## 2016-12-25 NOTE — Progress Notes (Signed)
Pre visit review using our clinic review tool, if applicable. No additional management support is needed unless otherwise documented below in the visit note. 

## 2016-12-28 ENCOUNTER — Encounter: Payer: Self-pay | Admitting: *Deleted

## 2017-01-04 ENCOUNTER — Encounter: Payer: Self-pay | Admitting: Cardiology

## 2017-01-04 ENCOUNTER — Ambulatory Visit (INDEPENDENT_AMBULATORY_CARE_PROVIDER_SITE_OTHER): Payer: Medicare Other | Admitting: Cardiology

## 2017-01-04 VITALS — BP 130/62 | HR 79 | Ht 64.0 in | Wt 154.0 lb

## 2017-01-04 DIAGNOSIS — I493 Ventricular premature depolarization: Secondary | ICD-10-CM | POA: Diagnosis not present

## 2017-01-04 DIAGNOSIS — Z720 Tobacco use: Secondary | ICD-10-CM | POA: Diagnosis not present

## 2017-01-04 DIAGNOSIS — K219 Gastro-esophageal reflux disease without esophagitis: Secondary | ICD-10-CM

## 2017-01-04 DIAGNOSIS — E7800 Pure hypercholesterolemia, unspecified: Secondary | ICD-10-CM | POA: Diagnosis not present

## 2017-01-04 NOTE — Patient Instructions (Signed)
Medication Instructions:  The current medical regimen is effective;  continue present plan and medications.  Follow-Up: Follow up as needed with Dr Skains.  Thank you for choosing [facility]!!     

## 2017-01-04 NOTE — Progress Notes (Signed)
Cardiology Office Note    Date:  01/04/2017   ID:  Sharon Robinson, DOB 10/14/1946, MRN [id number]  PCP:  Wilfred Lacy, NP  Cardiologist:   Candee Furbish, MD     History of Present Illness:  Sharon Robinson is a 71 y.o. female here for the follow-up of hyperlipidemia with palpitations/PVC.   Overall she has been doing very well. No chest pain, no syncope, no shortness of breath. Still smoking. Does not feel PVCs. She established with a primary physician/PA.   Past Medical History:  Diagnosis Date  . DI (detrusor instability)   . GERD (gastroesophageal reflux disease)   . H/O vitamin D deficiency   . Hyperlipidemia   . Osteopenia   . Sinusitis    related to smoking  . STD (sexually transmitted disease)    HSV 2    Past Surgical History:  Procedure Laterality Date  . CATARACT EXTRACTION Left    Jun 20 2015  . OTHER SURGICAL HISTORY     T&A age 64  . OTHER SURGICAL HISTORY  1989   tubal ligation  . TONSILLECTOMY    . TUBAL LIGATION      Current Medications: Outpatient Medications Prior to Visit  Medication Sig Dispense Refill  . alendronate (FOSAMAX) 70 MG tablet Take 1 tablet (70 mg total) by mouth once a week. Take with a full glass of water on an empty stomach. 4 tablet 11  . atorvastatin (LIPITOR) 40 MG tablet Take 1 tablet (40 mg total) by mouth daily. 30 tablet 11  . carbamide peroxide (DEBROX) 6.5 % otic solution Place 5 drops into both ears 2 (two) times daily as needed. 15 mL 0  . cetirizine (ZYRTEC ALLERGY) 10 MG tablet Take 1 tablet (10 mg total) by mouth daily. 30 tablet 30  . Cholecalciferol (VITAMIN D) 2000 units CAPS Take 2,000 Units by mouth daily.     . GuaiFENesin (MUCINEX PO) Take 600 mg by mouth as needed (for congestion). Takes prn    . ibuprofen (ADVIL,MOTRIN) 800 MG tablet Take 13 times a day with meals for one week 30 tablet 1  . naproxen sodium (ANAPROX) 220 MG tablet Take 220 mg by mouth 2 (two) times daily with a meal.    . valACYclovir  (VALTREX) 500 MG tablet TAKE 1 TABLET ONCE DAILY. 30 tablet 5   No facility-administered medications prior to visit.      Allergies:   Patient has no known allergies.   Social History   Social History  . Marital status: Single    Spouse name: N/A  . Number of children: N/A  . Years of education: N/A   Social History Main Topics  . Smoking status: Current Every Day Smoker    Packs/day: 0.50    Types: Cigarettes  . Smokeless tobacco: Never Used  . Alcohol use 0.0 oz/week     Comment: rare  . Drug use: No  . Sexual activity: No   Other Topics Concern  . None   Social History Narrative  . None     Family History:  The patient's family history includes Cancer (age of onset: 67) in her father; Heart attack (age of onset: 91) in her mother.   ROS:   Please see the history of present illness.    ROS All other systems reviewed and are negative.   PHYSICAL EXAM:   VS:  BP 130/62   Pulse 79   Ht 5\' 4"  (1.626 m)   Wt 154  lb (69.9 kg)   SpO2 98%   BMI 26.43 kg/m    GEN: Well nourished, well developed, in no acute distress  HEENT: normal  Neck: no JVD, carotid bruits, or masses Cardiac: RRR Rare ectopy; no murmurs, rubs, or gallops,no edema  Respiratory:  clear to auscultation bilaterally, normal work of breathing GI: soft, nontender, nondistended, + BS MS: no deformity or atrophy  Skin: warm and dry, no rash Neuro:  Alert and Oriented x 3, Strength and sensation are intact Psych: euthymic mood, full affect  Wt Readings from Last 3 Encounters:  01/04/17 154 lb (69.9 kg)  12/25/16 159 lb (72.1 kg)  06/06/16 156 lb (70.8 kg)      Studies/Labs Reviewed:   EKG:  EKG is ordered today.  The ekg ordered today demonstrates A 01/04/17-sinus rhythm with PVC noted. Heart rate 70. No other abnormalities.  Recent Labs: No results found for requested labs within last 8760 hours.   Lipid Panel    Component Value Date/Time   CHOL 133 01/05/2016 1045   TRIG 107 01/05/2016  1045   HDL 53 01/05/2016 1045   CHOLHDL 2.5 01/05/2016 1045   VLDL 21 01/05/2016 1045   LDLCALC 59 01/05/2016 1045    Additional studies/ records that were reviewed today include:  Prior office notes reviewed, lab work reviewed, EKGs reviewed    ASSESSMENT:    1. Hypercholesterolemia   2. Gastroesophageal reflux disease without esophagitis   3. Tobacco abuse   4. PVC (premature ventricular contraction)      PLAN:  In order of problems listed above:  Hyperlipidemia  - Atorvastatin doing well  - No myalgias, no excessive side effects.  - She can continue to have this checked by her primary provider.  Tobacco use  - Encouraged cessation. Still trying to quit.  - does not inhale. Asked her to try to completely quit.  GERD  - Encourage lifestyle modifications  - Smoking cessation.  - has rare symptoms.   PVC  - Rare PVC noted on EKG. Benign. We discussed in clinic.  I'm comfortable seeing her back on as-needed basis.  Medication Adjustments/Labs and Tests Ordered: Current medicines are reviewed at length with the patient today.  Concerns regarding medicines are outlined above.  Medication changes, Labs and Tests ordered today are listed in the Patient Instructions below. Patient Instructions  Medication Instructions:  The current medical regimen is effective;  continue present plan and medications.  Follow-Up: Follow up as needed with Dr Marlou Porch  Thank you for choosing [facility]!!        Signed, Candee Furbish, MD  01/04/2017 2:23 PM    [address], [place], [place]  [address] Phone: [phone]; Fax: [phone]

## 2017-01-21 ENCOUNTER — Encounter: Payer: Self-pay | Admitting: Gynecology

## 2017-02-01 ENCOUNTER — Other Ambulatory Visit: Payer: Self-pay | Admitting: Cardiology

## 2017-02-27 IMAGING — CR DG LUMBAR SPINE 2-3V
3 series · 3 of 3 positions shown · non-contrast
Comparison: 04/19/2009.

CLINICAL DATA: Pain.  Initial evaluation.

EXAM:
LUMBAR SPINE - 2-3 VIEW

[t l-spine a.p.]
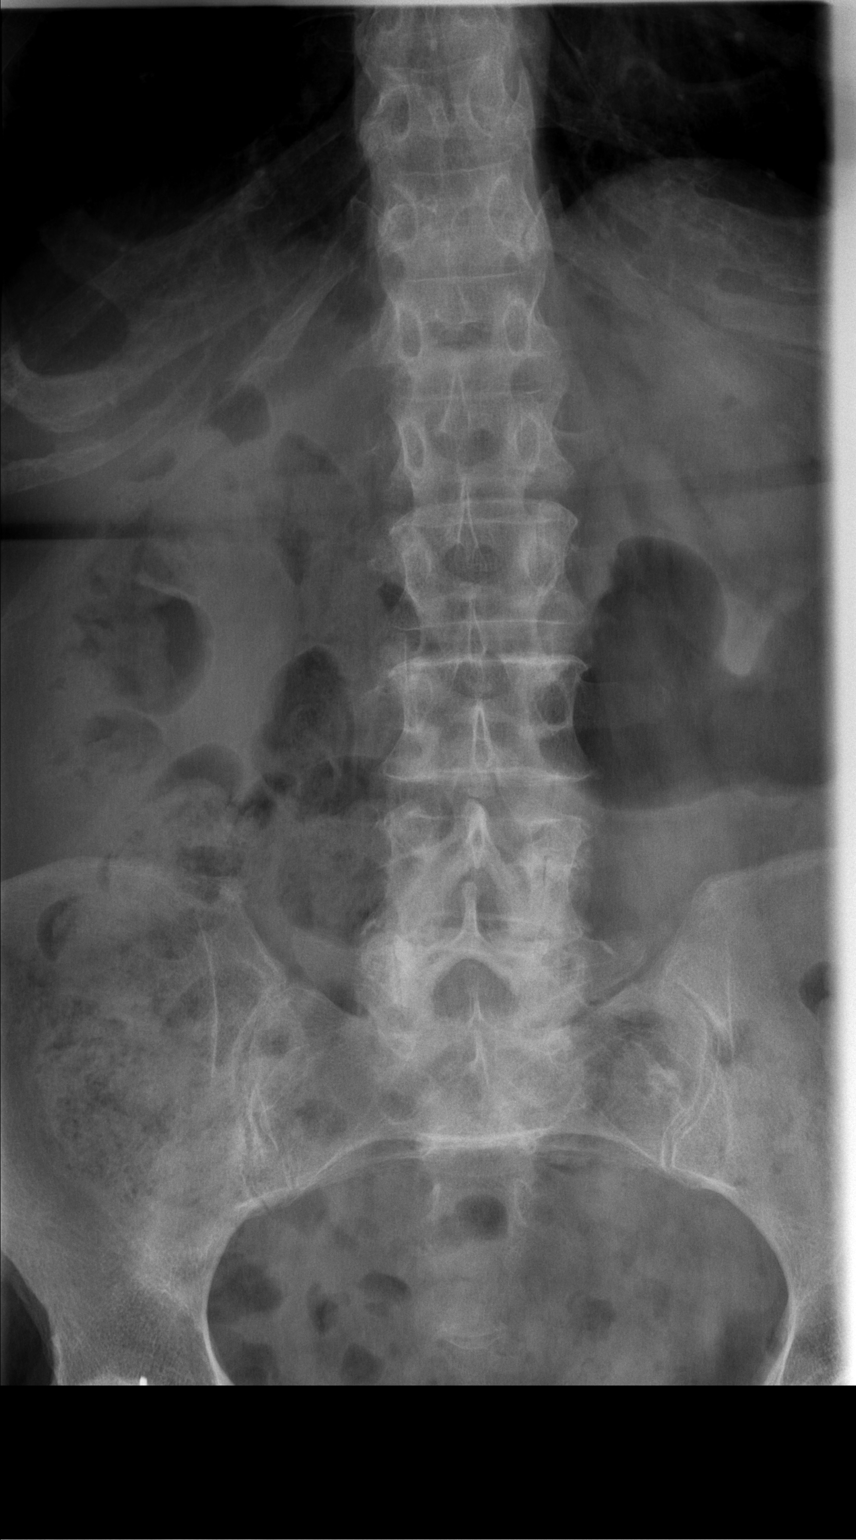

[t l-spine lat]
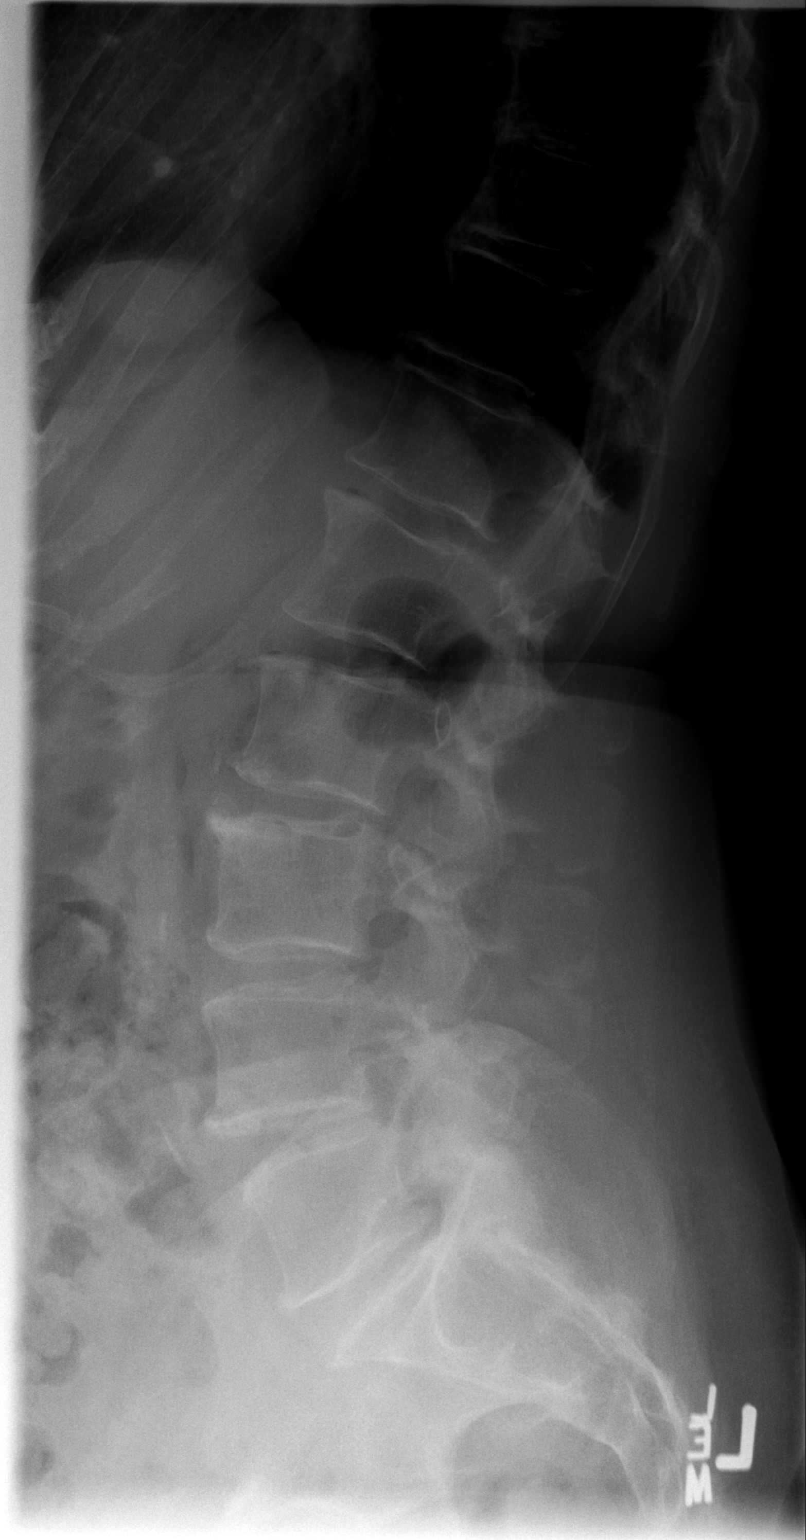

[t l-spine l5-s1 spot]
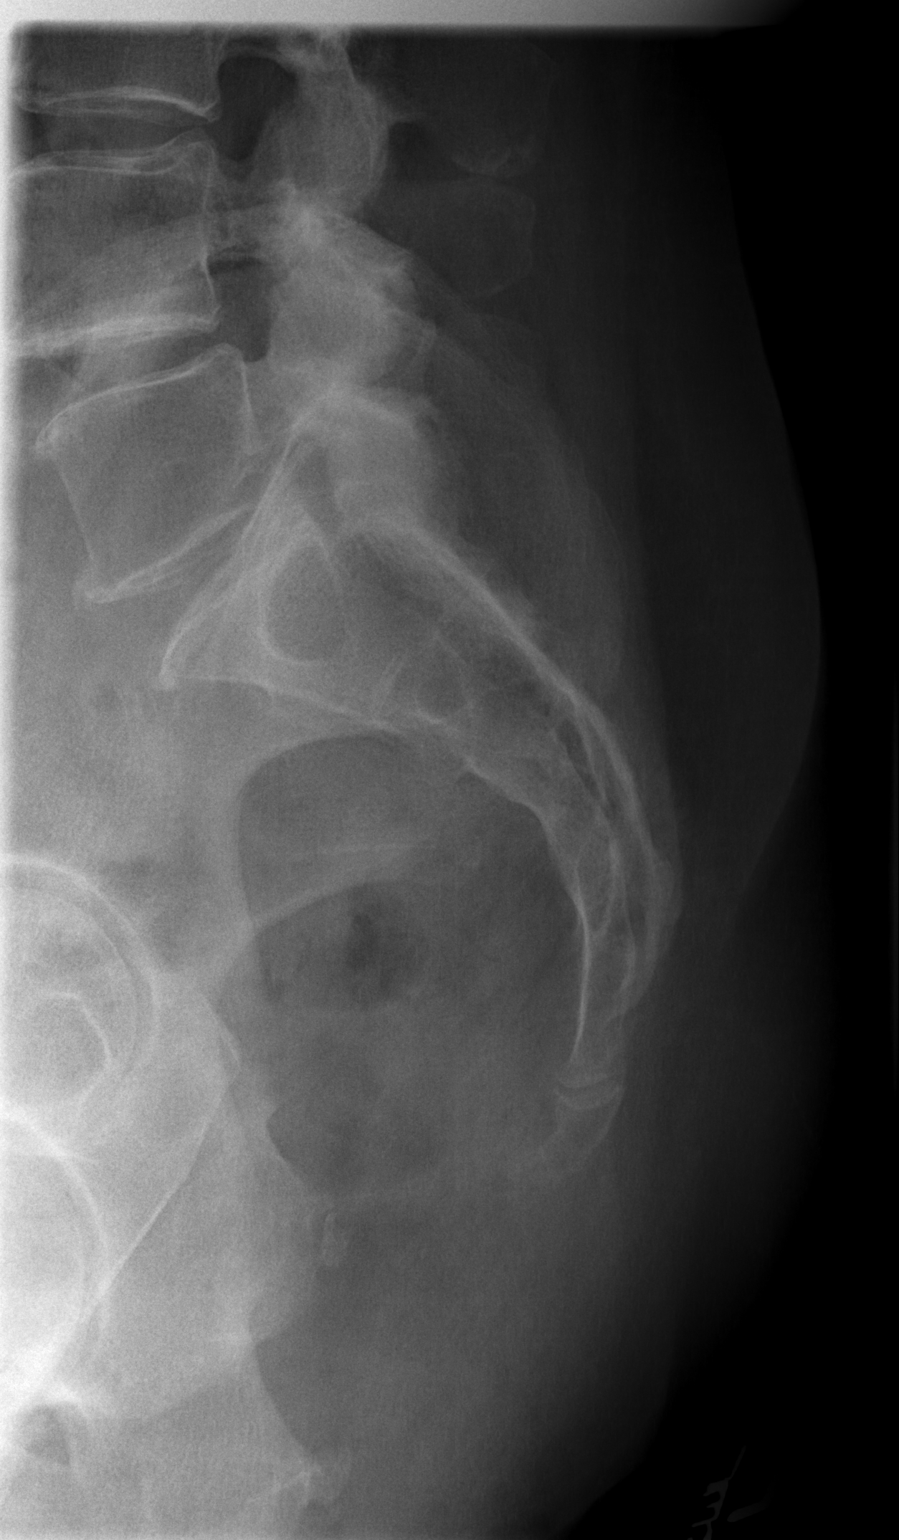

[3 of 3 positions shown; findings below may reference images not displayed]

FINDINGS: Soft structures are unremarkable. Mild scoliosis concave right. No
acute bony abnormality identified. Multilevel degenerative change
present. 4 mm anterolisthesis L4 on L5 .
IMPRESSION: Mild scoliosis concave right with diffuse degenerative change. 4 mm
anterolisthesis L4 on L5.

## 2017-03-06 ENCOUNTER — Encounter: Payer: Self-pay | Admitting: Gynecology

## 2017-04-25 ENCOUNTER — Other Ambulatory Visit (INDEPENDENT_AMBULATORY_CARE_PROVIDER_SITE_OTHER): Payer: Medicare Other

## 2017-04-25 ENCOUNTER — Encounter: Payer: Self-pay | Admitting: Nurse Practitioner

## 2017-04-25 ENCOUNTER — Ambulatory Visit (INDEPENDENT_AMBULATORY_CARE_PROVIDER_SITE_OTHER): Payer: Medicare Other | Admitting: Nurse Practitioner

## 2017-04-25 VITALS — BP 134/74 | HR 67 | Temp 97.4°F | Ht 64.0 in | Wt 157.0 lb

## 2017-04-25 DIAGNOSIS — Z0000 Encounter for general adult medical examination without abnormal findings: Secondary | ICD-10-CM

## 2017-04-25 DIAGNOSIS — M545 Low back pain, unspecified: Secondary | ICD-10-CM

## 2017-04-25 DIAGNOSIS — Z1159 Encounter for screening for other viral diseases: Secondary | ICD-10-CM

## 2017-04-25 LAB — CBC WITH DIFFERENTIAL/PLATELET
BASOS PCT: 1.1 % (ref 0.0–3.0)
Basophils Absolute: 0.1 10*3/uL (ref 0.0–0.1)
Eosinophils Absolute: 0.1 10*3/uL (ref 0.0–0.7)
Eosinophils Relative: 1 % (ref 0.0–5.0)
HEMATOCRIT: 39 % (ref 36.0–46.0)
Hemoglobin: 13.2 g/dL (ref 12.0–15.0)
LYMPHS ABS: 3.5 10*3/uL (ref 0.7–4.0)
LYMPHS PCT: 39.7 % (ref 12.0–46.0)
MCHC: 33.9 g/dL (ref 30.0–36.0)
MCV: 104.8 fl — AB (ref 78.0–100.0)
MONOS PCT: 6.8 % (ref 3.0–12.0)
Monocytes Absolute: 0.6 10*3/uL (ref 0.1–1.0)
NEUTROS ABS: 4.5 10*3/uL (ref 1.4–7.7)
Neutrophils Relative %: 51.4 % (ref 43.0–77.0)
PLATELETS: 275 10*3/uL (ref 150.0–400.0)
RBC: 3.72 Mil/uL — ABNORMAL LOW (ref 3.87–5.11)
RDW: 12.9 % (ref 11.5–15.5)
WBC: 8.8 10*3/uL (ref 4.0–10.5)

## 2017-04-25 LAB — TSH: TSH: 1.27 u[IU]/mL (ref 0.35–4.50)

## 2017-04-25 LAB — LIPID PANEL
CHOL/HDL RATIO: 3
Cholesterol: 135 mg/dL (ref 0–200)
HDL: 53.8 mg/dL (ref >39.00)
LDL CALC: 63 mg/dL (ref 0–99)
NONHDL: 81.5
Triglycerides: 91 mg/dL (ref 0.0–149.0)
VLDL: 18.2 mg/dL (ref 0.0–40.0)

## 2017-04-25 MED ORDER — MELOXICAM 7.5 MG PO TABS: 7.5000 mg | ORAL_TABLET | Freq: Every day | ORAL | 0 refills | Status: DC | PRN

## 2017-04-25 NOTE — Progress Notes (Signed)
Subjective:  Patient ID: NILZA EAKER, female    DOB: 03-Jan-1946  Age: 71 y.o. MRN: [id number]  CC: Follow-up (pain on lower back (left side) 1 mo)   Back Pain  This is a new problem. The current episode started 1 to 4 weeks ago. The problem occurs intermittently. The problem is unchanged. The pain is present in the gluteal. The quality of the pain is described as aching. The pain does not radiate. Stiffness is present all day. Pertinent negatives include no abdominal pain, bladder incontinence, bowel incontinence, chest pain, dysuria, fever, headaches, leg pain, numbness, paresis, paresthesias, pelvic pain, perianal numbness, tingling, weakness or weight loss. Risk factors include history of osteoporosis and menopause. She has tried NSAIDs (and tylenol) for the symptoms. The treatment provided mild relief.  recent increase in activity to improve cardiac health.  Outpatient Medications Prior to Visit  Medication Sig Dispense Refill  . alendronate (FOSAMAX) 70 MG tablet Take 1 tablet (70 mg total) by mouth once a week. Take with a full glass of water on an empty stomach. 4 tablet 11  . atorvastatin (LIPITOR) 40 MG tablet Take 1 tablet (40 mg total) by mouth daily. 90 tablet 3  . carbamide peroxide (DEBROX) 6.5 % otic solution Place 5 drops into both ears 2 (two) times daily as needed. 15 mL 0  . cetirizine (ZYRTEC ALLERGY) 10 MG tablet Take 1 tablet (10 mg total) by mouth daily. 30 tablet 30  . Cholecalciferol (VITAMIN D) 2000 units CAPS Take 2,000 Units by mouth daily.     . GuaiFENesin (MUCINEX PO) Take 600 mg by mouth as needed (for congestion). Takes prn    . valACYclovir (VALTREX) 500 MG tablet TAKE 1 TABLET ONCE DAILY. 30 tablet 5  . ibuprofen (ADVIL,MOTRIN) 800 MG tablet Take 13 times a day with meals for one week (Patient not taking: Reported on 04/25/2017) 30 tablet 1  . naproxen sodium (ANAPROX) 220 MG tablet Take 220 mg by mouth 2 (two) times daily with a meal.     No  facility-administered medications prior to visit.     ROS See HPI  Objective:  BP 134/74   Pulse 67   Temp (!) 97.4 F (36.3 C)   Ht 5\' 4"  (1.626 m)   Wt 157 lb (71.2 kg)   SpO2 98%   BMI 26.95 kg/m   BP Readings from Last 3 Encounters:  04/25/17 134/74  01/04/17 130/62  12/25/16 110/60    Wt Readings from Last 3 Encounters:  04/25/17 157 lb (71.2 kg)  01/04/17 154 lb (69.9 kg)  12/25/16 159 lb (72.1 kg)    Physical Exam  Constitutional: She is oriented to person, place, and time. No distress.  Cardiovascular: Normal rate and regular rhythm.   Pulmonary/Chest: Effort normal. No respiratory distress.  Abdominal: Soft. She exhibits no distension. There is no tenderness. There is no rebound and no guarding.  Musculoskeletal: Normal range of motion. She exhibits no edema or tenderness.  No CVA tenderness, negative straight leg raise  Neurological: She is alert and oriented to person, place, and time.  Skin: Skin is warm and dry.  Psychiatric: She has a normal mood and affect. Her behavior is normal.  Vitals reviewed.   Lab Results  Component Value Date   WBC 8.8 04/25/2017   HGB 13.2 04/25/2017   HCT 39.0 04/25/2017   PLT 275.0 04/25/2017   GLUCOSE 93 08/16/2014   CHOL 135 04/25/2017   TRIG 91.0 04/25/2017   HDL 53.80  04/25/2017   LDLCALC 63 04/25/2017   ALT 17 01/05/2016   AST 20 08/16/2014   NA 140 08/16/2014   K 4.0 08/16/2014   CL 106 08/16/2014   CREATININE 0.8 08/16/2014   BUN 18 08/16/2014   CO2 31 08/16/2014   TSH 1.27 04/25/2017    Dg Lumbar Spine 2-3 Views  Result Date: 06/03/2015 CLINICAL DATA:  Pain.  Initial evaluation. EXAM: LUMBAR SPINE - 2-3 VIEW COMPARISON:  04/19/2009. FINDINGS: Soft structures are unremarkable. Mild scoliosis concave right. No acute bony abnormality identified. Multilevel degenerative change present. 4 mm anterolisthesis L4 on L5 . IMPRESSION: Mild scoliosis concave right with diffuse degenerative change. 4 mm  anterolisthesis L4 on L5. Electronically Signed   By: Marcello Moores  Register   On: 06/03/2015 14:46    Assessment & Plan:   Rebekkah was seen today for follow-up.  Diagnoses and all orders for this visit:  Acute left-sided low back pain without sciatica -     meloxicam (MOBIC) 7.5 MG tablet; Take 1 tablet (7.5 mg total) by mouth daily as needed for pain. With food   I have discontinued Ms. Motton's naproxen sodium and ibuprofen. I am also having her start on meloxicam. Additionally, I am having her maintain her GuaiFENesin (MUCINEX PO), Vitamin D, alendronate, valACYclovir, cetirizine, carbamide peroxide, and atorvastatin.  Meds ordered this encounter  Medications  . meloxicam (MOBIC) 7.5 MG tablet    Sig: Take 1 tablet (7.5 mg total) by mouth daily as needed for pain. With food    Dispense:  14 tablet    Refill:  0    Order Specific Question:   Supervising Provider    Answer:   Cassandria Anger [[id number]]    Follow-up: Return if symptoms worsen or fail to improve.  Wilfred Lacy, NP

## 2017-04-25 NOTE — Patient Instructions (Signed)
Alternate between warm and cold compress as needed.  Call office for back x-ray if no improvement in 1weeks.  Back Exercises If you have pain in your back, do these exercises 2-3 times each day or as told by your doctor. When the pain goes away, do the exercises once each day, but repeat the steps more times for each exercise (do more repetitions). If you do not have pain in your back, do these exercises once each day or as told by your doctor. Exercises Single Knee to Chest  Do these steps 3-5 times in a row for each leg: 1. Lie on your back on a firm bed or the floor with your legs stretched out. 2. Bring one knee to your chest. 3. Hold your knee to your chest by grabbing your knee or thigh. 4. Pull on your knee until you feel a gentle stretch in your lower back. 5. Keep doing the stretch for 10-30 seconds. 6. Slowly let go of your leg and straighten it.  Pelvic Tilt  Do these steps 5-10 times in a row: 1. Lie on your back on a firm bed or the floor with your legs stretched out. 2. Bend your knees so they point up to the ceiling. Your feet should be flat on the floor. 3. Tighten your lower belly (abdomen) muscles to press your lower back against the floor. This will make your tailbone point up to the ceiling instead of pointing down to your feet or the floor. 4. Stay in this position for 5-10 seconds while you gently tighten your muscles and breathe evenly.  Cat-Cow  Do these steps until your lower back bends more easily: 1. Get on your hands and knees on a firm surface. Keep your hands under your shoulders, and keep your knees under your hips. You may put padding under your knees. 2. Let your head hang down, and make your tailbone point down to the floor so your lower back is round like the back of a cat. 3. Stay in this position for 5 seconds. 4. Slowly lift your head and make your tailbone point up to the ceiling so your back hangs low (sags) like the back of a cow. 5. Stay in  this position for 5 seconds.  Press-Ups  Do these steps 5-10 times in a row: 1. Lie on your belly (face-down) on the floor. 2. Place your hands near your head, about shoulder-width apart. 3. While you keep your back relaxed and keep your hips on the floor, slowly straighten your arms to raise the top half of your body and lift your shoulders. Do not use your back muscles. To make yourself more comfortable, you may change where you place your hands. 4. Stay in this position for 5 seconds. 5. Slowly return to lying flat on the floor.  Bridges  Do these steps 10 times in a row: 1. Lie on your back on a firm surface. 2. Bend your knees so they point up to the ceiling. Your feet should be flat on the floor. 3. Tighten your butt muscles and lift your butt off of the floor until your waist is almost as high as your knees. If you do not feel the muscles working in your butt and the back of your thighs, slide your feet 1-2 inches farther away from your butt. 4. Stay in this position for 3-5 seconds. 5. Slowly lower your butt to the floor, and let your butt muscles relax.  If this exercise is too  easy, try doing it with your arms crossed over your chest. Belly Crunches  Do these steps 5-10 times in a row: 1. Lie on your back on a firm bed or the floor with your legs stretched out. 2. Bend your knees so they point up to the ceiling. Your feet should be flat on the floor. 3. Cross your arms over your chest. 4. Tip your chin a little bit toward your chest but do not bend your neck. 5. Tighten your belly muscles and slowly raise your chest just enough to lift your shoulder blades a tiny bit off of the floor. 6. Slowly lower your chest and your head to the floor.  Back Lifts Do these steps 5-10 times in a row: 1. Lie on your belly (face-down) with your arms at your sides, and rest your forehead on the floor. 2. Tighten the muscles in your legs and your butt. 3. Slowly lift your chest off of the  floor while you keep your hips on the floor. Keep the back of your head in line with the curve in your back. Look at the floor while you do this. 4. Stay in this position for 3-5 seconds. 5. Slowly lower your chest and your face to the floor.  Contact a doctor if:  Your back pain gets a lot worse when you do an exercise.  Your back pain does not lessen 2 hours after you exercise. If you have any of these problems, stop doing the exercises. Do not do them again unless your doctor says it is okay. Get help right away if:  You have sudden, very bad back pain. If this happens, stop doing the exercises. Do not do them again unless your doctor says it is okay. This information is not intended to replace advice given to you by your health care provider. Make sure you discuss any questions you have with your health care provider. Document Released: 11/10/2010 Document Revised: 03/15/2016 Document Reviewed: 12/02/2014 Elsevier Interactive Patient Education  [employer].

## 2017-04-26 LAB — HEPATITIS C ANTIBODY: HCV Ab: NEGATIVE

## 2017-05-03 ENCOUNTER — Other Ambulatory Visit: Payer: Self-pay | Admitting: Gynecology

## 2017-05-03 NOTE — Telephone Encounter (Signed)
CE scheduled August 20 with Dr. Dellis Filbert.

## 2017-06-03 ENCOUNTER — Other Ambulatory Visit: Payer: Self-pay

## 2017-06-03 MED ORDER — ALENDRONATE SODIUM 70 MG PO TABS: 70.0000 mg | ORAL_TABLET | ORAL | 0 refills | Status: DC

## 2017-06-10 ENCOUNTER — Ambulatory Visit (INDEPENDENT_AMBULATORY_CARE_PROVIDER_SITE_OTHER): Payer: Medicare Other | Admitting: Obstetrics & Gynecology

## 2017-06-10 ENCOUNTER — Encounter: Payer: Self-pay | Admitting: Obstetrics & Gynecology

## 2017-06-10 VITALS — BP 124/68 | Ht 64.0 in | Wt 159.0 lb

## 2017-06-10 DIAGNOSIS — M85852 Other specified disorders of bone density and structure, left thigh: Secondary | ICD-10-CM

## 2017-06-10 DIAGNOSIS — Z01411 Encounter for gynecological examination (general) (routine) with abnormal findings: Secondary | ICD-10-CM

## 2017-06-10 DIAGNOSIS — Z124 Encounter for screening for malignant neoplasm of cervix: Secondary | ICD-10-CM

## 2017-06-10 DIAGNOSIS — N952 Postmenopausal atrophic vaginitis: Secondary | ICD-10-CM | POA: Diagnosis not present

## 2017-06-10 DIAGNOSIS — M85859 Other specified disorders of bone density and structure, unspecified thigh: Secondary | ICD-10-CM

## 2017-06-10 DIAGNOSIS — M85851 Other specified disorders of bone density and structure, right thigh: Secondary | ICD-10-CM

## 2017-06-10 MED ORDER — ALENDRONATE SODIUM 70 MG PO TABS: 70.0000 mg | ORAL_TABLET | ORAL | 4 refills | Status: DC

## 2017-06-10 NOTE — Progress Notes (Signed)
Sharon Robinson [birth year] [id number]   History:    71 y.o. G0 Single.  Abstinent.  RP:  Established patient presenting for annual gyn exam   HPI:  Menopause.  No HRT.  No PMB.  Osteopenia per BD 07/2016 on Fosamax and Vit D supplements.  Last Vit D level 08/2016 at 82.   No Pelvic pain.  Abstinent.  Breasts wnl.  Mictions/BMs wnl.   Past medical history,surgical history, family history and social history were all reviewed and documented in the EPIC chart.  Gynecologic History No LMP recorded. Patient is postmenopausal. Contraception: post menopausal status Last Pap: 2013. Results were: normal Last mammogram: 01/2017. Results were: Negative Bone Density 07/2016 Osteopenia Colono 2009  Obstetric History OB History  Gravida Para Term Preterm AB Living  0            SAB TAB Ectopic Multiple Live Births                    ROS: A ROS was performed and pertinent positives and negatives are included in the history.  GENERAL: No fevers or chills. HEENT: No change in vision, no earache, sore throat or sinus congestion. NECK: No pain or stiffness. CARDIOVASCULAR: No chest pain or pressure. No palpitations. PULMONARY: No shortness of breath, cough or wheeze. GASTROINTESTINAL: No abdominal pain, nausea, vomiting or diarrhea, melena or bright red blood per rectum. GENITOURINARY: No urinary frequency, urgency, hesitancy or dysuria. MUSCULOSKELETAL: No joint or muscle pain, no back pain, no recent trauma. DERMATOLOGIC: No rash, no itching, no lesions. ENDOCRINE: No polyuria, polydipsia, no heat or cold intolerance. No recent change in weight. HEMATOLOGICAL: No anemia or easy bruising or bleeding. NEUROLOGIC: No headache, seizures, numbness, tingling or weakness. PSYCHIATRIC: No depression, no loss of interest in normal activity or change in sleep pattern.     Exam:   Ht 5\' 4"  (1.626 m)   Wt 159 lb (72.1 kg)   BMI 27.29 kg/m   Body mass index is 27.29 kg/m.  General appearance : Well  developed well nourished female. No acute distress HEENT: Eyes: no retinal hemorrhage or exudates,  Neck supple, trachea midline, no carotid bruits, no thyroidmegaly Lungs: Clear to auscultation, no rhonchi or wheezes, or rib retractions  Heart: Regular rate and rhythm, no murmurs or gallops Breast:Examined in sitting and supine position were symmetrical in appearance, no palpable masses or tenderness,  no skin retraction, no nipple inversion, no nipple discharge, no skin discoloration, no axillary or supraclavicular lymphadenopathy Abdomen: no palpable masses or tenderness, no rebound or guarding Extremities: no edema or skin discoloration or tenderness  Pelvic: Vulva: Normal with Atrophy  Bartholin, Urethra, Skene Glands: Within normal limits             Vagina: No gross lesions or discharge  Cervix: No gross lesions or discharge.  Pap reflex done  Uterus  AV, normal size, shape and consistency, non-tender and mobile  Adnexa  Without masses or tenderness  Anus and perineum  normal      Assessment/Plan:  71 y.o. female for annual exam   1. Encounter for gynecological examination with abnormal finding Gyn exam with Atrophy.  Pap reflex done.  Breasts wnl.  Mammo neg 01/2017.  2. Post-menopausal atrophic vaginitis Asymptomatic.  Abstinent.  3. Osteopenia of both hips On Fosamax, represcribed.  Last Vit D level good at 82.  Continue Vit D same supplemental dosage.  Ca++ in food.  Weight bearing physical activity.  Repeat Bone Density in  2019.  Counseling on above issues >50% x 15 minutes.  Princess Bruins MD, 2:19 PM 06/10/2017

## 2017-06-11 LAB — PAP IG W/ RFLX HPV ASCU

## 2017-06-16 NOTE — Patient Instructions (Signed)
1. Encounter for gynecological examination with abnormal finding Gyn exam with Atrophy.  Pap reflex done.  Breasts wnl.  Mammo neg 01/2017.  2. Post-menopausal atrophic vaginitis Asymptomatic.  Abstinent.  3. Osteopenia of both hips On Fosamax, represcribed.  Last Vit D level good at 82.  Continue Vit D same supplemental dosage.  Ca++ in food.  Weight bearing physical activity.  Repeat Bone Density in 2019.  Sharon Robinson, it was a pleasure to meet you today!  I will inform you of your results as soon as available.   Health Maintenance for Postmenopausal Women Menopause is a normal process in which your reproductive ability comes to an end. This process happens gradually over a span of months to years, usually between the ages of 92 and 52. Menopause is complete when you have missed 12 consecutive menstrual periods. It is important to talk with your health care provider about some of the most common conditions that affect postmenopausal women, such as heart disease, cancer, and bone loss (osteoporosis). Adopting a healthy lifestyle and getting preventive care can help to promote your health and wellness. Those actions can also lower your chances of developing some of these common conditions. What should I know about menopause? During menopause, you may experience a number of symptoms, such as:  Moderate-to-severe hot flashes.  Night sweats.  Decrease in sex drive.  Mood swings.  Headaches.  Tiredness.  Irritability.  Memory problems.  Insomnia.  Choosing to treat or not to treat menopausal changes is an individual decision that you make with your health care provider. What should I know about hormone replacement therapy and supplements? Hormone therapy products are effective for treating symptoms that are associated with menopause, such as hot flashes and night sweats. Hormone replacement carries certain risks, especially as you become older. If you are thinking about using estrogen or  estrogen with progestin treatments, discuss the benefits and risks with your health care provider. What should I know about heart disease and stroke? Heart disease, heart attack, and stroke become more likely as you age. This may be due, in part, to the hormonal changes that your body experiences during menopause. These can affect how your body processes dietary fats, triglycerides, and cholesterol. Heart attack and stroke are both medical emergencies. There are many things that you can do to help prevent heart disease and stroke:  Have your blood pressure checked at least every 1-2 years. High blood pressure causes heart disease and increases the risk of stroke.  If you are 55-85 years old, ask your health care provider if you should take aspirin to prevent a heart attack or a stroke.  Do not use any tobacco products, including cigarettes, chewing tobacco, or electronic cigarettes. If you need help quitting, ask your health care provider.  It is important to eat a healthy diet and maintain a healthy weight. ? Be sure to include plenty of vegetables, fruits, low-fat dairy products, and lean protein. ? Avoid eating foods that are high in solid fats, added sugars, or salt (sodium).  Get regular exercise. This is one of the most important things that you can do for your health. ? Try to exercise for at least 150 minutes each week. The type of exercise that you do should increase your heart rate and make you sweat. This is known as moderate-intensity exercise. ? Try to do strengthening exercises at least twice each week. Do these in addition to the moderate-intensity exercise.  Know your numbers.Ask your health care provider to check  your cholesterol and your blood glucose. Continue to have your blood tested as directed by your health care provider.  What should I know about cancer screening? There are several types of cancer. Take the following steps to reduce your risk and to catch any cancer  development as early as possible. Breast Cancer  Practice breast self-awareness. ? This means understanding how your breasts normally appear and feel. ? It also means doing regular breast self-exams. Let your health care provider know about any changes, no matter how small.  If you are 56 or older, have a clinician do a breast exam (clinical breast exam or CBE) every year. Depending on your age, family history, and medical history, it may be recommended that you also have a yearly breast X-ray (mammogram).  If you have a family history of breast cancer, talk with your health care provider about genetic screening.  If you are at high risk for breast cancer, talk with your health care provider about having an MRI and a mammogram every year.  Breast cancer (BRCA) gene test is recommended for women who have family members with BRCA-related cancers. Results of the assessment will determine the need for genetic counseling and BRCA1 and for BRCA2 testing. BRCA-related cancers include these types: ? Breast. This occurs in males or females. ? Ovarian. ? Tubal. This may also be called fallopian tube cancer. ? Cancer of the abdominal or pelvic lining (peritoneal cancer). ? Prostate. ? Pancreatic.  Cervical, Uterine, and Ovarian Cancer Your health care provider may recommend that you be screened regularly for cancer of the pelvic organs. These include your ovaries, uterus, and vagina. This screening involves a pelvic exam, which includes checking for microscopic changes to the surface of your cervix (Pap test).  For women ages 21-65, health care providers may recommend a pelvic exam and a Pap test every three years. For women ages 48-65, they may recommend the Pap test and pelvic exam, combined with testing for human papilloma virus (HPV), every five years. Some types of HPV increase your risk of cervical cancer. Testing for HPV may also be done on women of any age who have unclear Pap test  results.  Other health care providers may not recommend any screening for nonpregnant women who are considered low risk for pelvic cancer and have no symptoms. Ask your health care provider if a screening pelvic exam is right for you.  If you have had past treatment for cervical cancer or a condition that could lead to cancer, you need Pap tests and screening for cancer for at least 20 years after your treatment. If Pap tests have been discontinued for you, your risk factors (such as having a new sexual partner) need to be reassessed to determine if you should start having screenings again. Some women have medical problems that increase the chance of getting cervical cancer. In these cases, your health care provider may recommend that you have screening and Pap tests more often.  If you have a family history of uterine cancer or ovarian cancer, talk with your health care provider about genetic screening.  If you have vaginal bleeding after reaching menopause, tell your health care provider.  There are currently no reliable tests available to screen for ovarian cancer.  Lung Cancer Lung cancer screening is recommended for adults 2-55 years old who are at high risk for lung cancer because of a history of smoking. A yearly low-dose CT scan of the lungs is recommended if you:  Currently smoke.  Have a history of at least 30 pack-years of smoking and you currently smoke or have quit within the past 15 years. A pack-year is smoking an average of one pack of cigarettes per day for one year.  Yearly screening should:  Continue until it has been 15 years since you quit.  Stop if you develop a health problem that would prevent you from having lung cancer treatment.  Colorectal Cancer  This type of cancer can be detected and can often be prevented.  Routine colorectal cancer screening usually begins at age 15 and continues through age 63.  If you have risk factors for colon cancer, your health  care provider may recommend that you be screened at an earlier age.  If you have a family history of colorectal cancer, talk with your health care provider about genetic screening.  Your health care provider may also recommend using home test kits to check for hidden blood in your stool.  A small camera at the end of a tube can be used to examine your colon directly (sigmoidoscopy or colonoscopy). This is done to check for the earliest forms of colorectal cancer.  Direct examination of the colon should be repeated every 5-10 years until age 18. However, if early forms of precancerous polyps or small growths are found or if you have a family history or genetic risk for colorectal cancer, you may need to be screened more often.  Skin Cancer  Check your skin from head to toe regularly.  Monitor any moles. Be sure to tell your health care provider: ? About any new moles or changes in moles, especially if there is a change in a mole's shape or color. ? If you have a mole that is larger than the size of a pencil eraser.  If any of your family members has a history of skin cancer, especially at a young age, talk with your health care provider about genetic screening.  Always use sunscreen. Apply sunscreen liberally and repeatedly throughout the day.  Whenever you are outside, protect yourself by wearing long sleeves, pants, a wide-brimmed hat, and sunglasses.  What should I know about osteoporosis? Osteoporosis is a condition in which bone destruction happens more quickly than new bone creation. After menopause, you may be at an increased risk for osteoporosis. To help prevent osteoporosis or the bone fractures that can happen because of osteoporosis, the following is recommended:  If you are 50-57 years old, get at least 1,000 mg of calcium and at least 600 mg of vitamin D per day.  If you are older than age 30 but younger than age 75, get at least 1,200 mg of calcium and at least 600 mg of  vitamin D per day.  If you are older than age 34, get at least 1,200 mg of calcium and at least 800 mg of vitamin D per day.  Smoking and excessive alcohol intake increase the risk of osteoporosis. Eat foods that are rich in calcium and vitamin D, and do weight-bearing exercises several times each week as directed by your health care provider. What should I know about how menopause affects my mental health? Depression may occur at any age, but it is more common as you become older. Common symptoms of depression include:  Low or sad mood.  Changes in sleep patterns.  Changes in appetite or eating patterns.  Feeling an overall lack of motivation or enjoyment of activities that you previously enjoyed.  Frequent crying spells.  Talk with your  health care provider if you think that you are experiencing depression. What should I know about immunizations? It is important that you get and maintain your immunizations. These include:  Tetanus, diphtheria, and pertussis (Tdap) booster vaccine.  Influenza every year before the flu season begins.  Pneumonia vaccine.  Shingles vaccine.  Your health care provider may also recommend other immunizations. This information is not intended to replace advice given to you by your health care provider. Make sure you discuss any questions you have with your health care provider. Document Released: 11/30/2005 Document Revised: 04/27/2016 Document Reviewed: 07/12/2015 Elsevier Interactive Patient Education  2018 [employer].

## 2017-08-12 ENCOUNTER — Encounter: Payer: Self-pay | Admitting: Nurse Practitioner

## 2017-08-12 ENCOUNTER — Ambulatory Visit (INDEPENDENT_AMBULATORY_CARE_PROVIDER_SITE_OTHER)
Admission: RE | Admit: 2017-08-12 | Discharge: 2017-08-12 | Disposition: A | Discharge status: Home / Self Care | Payer: Medicare Other | Source: Ambulatory Visit | Attending: Nurse Practitioner | Admitting: Nurse Practitioner

## 2017-08-12 ENCOUNTER — Ambulatory Visit (INDEPENDENT_AMBULATORY_CARE_PROVIDER_SITE_OTHER): Payer: Medicare Other | Admitting: Nurse Practitioner

## 2017-08-12 VITALS — BP 132/80 | HR 78 | Temp 97.8°F | Resp 16 | Ht 64.0 in | Wt 160.4 lb

## 2017-08-12 DIAGNOSIS — M545 Low back pain, unspecified: Secondary | ICD-10-CM

## 2017-08-12 DIAGNOSIS — G8929 Other chronic pain: Secondary | ICD-10-CM

## 2017-08-12 DIAGNOSIS — J069 Acute upper respiratory infection, unspecified: Secondary | ICD-10-CM

## 2017-08-12 DIAGNOSIS — Z8669 Personal history of other diseases of the nervous system and sense organs: Secondary | ICD-10-CM | POA: Diagnosis not present

## 2017-08-12 NOTE — Progress Notes (Signed)
Subjective:    Patient ID: Sharon Robinson, female    DOB: [birth year], 71 y.o.   MRN: [id number]  HPI Sharon Robinson is a 71 yo female who presents today to establish care. She Is transferring to me from another provider in the same clinic.  Back pain- Onset in July. She denies any injuries or falls. She was seen here for the pain and given mobic, which temporarily relieved the pain. The pain has become constant, occurring every day. The pain does not radiate. The pain is a tightness in her left lower back. She denies weakness, numbness, tingling, bowel or bladder incontinence.  Cough/cold- Symptom began on Saturday. She reports nasal congestion, sore throat, dry cough, chest congestion. She denies fevers, ear drainage, facial pain or pressure, chest pain, shortness of breath. she's treid mucinex with some relief. Overall, she feels well.  Review of Systems  See HPI-  Past Medical History:  Diagnosis Date  . DI (detrusor instability)   . GERD (gastroesophageal reflux disease)   . H/O vitamin D deficiency   . Hyperlipidemia   . Osteopenia   . Sinusitis    related to smoking  . STD (sexually transmitted disease)    HSV 2     Social History   Social History  . Marital status: Single    Spouse name: N/A  . Number of children: N/A  . Years of education: N/A   Occupational History  . Not on file.   Social History Main Topics  . Smoking status: Current Every Day Smoker    Packs/day: 0.50    Types: Cigarettes  . Smokeless tobacco: Never Used  . Alcohol use 0.0 oz/week     Comment: rare  . Drug use: No  . Sexual activity: No   Other Topics Concern  . Not on file   Social History Narrative  . No narrative on file    Past Surgical History:  Procedure Laterality Date  . CATARACT EXTRACTION Left    Jun 20 2015  . OTHER SURGICAL HISTORY     T&A age 30  . OTHER SURGICAL HISTORY  1989   tubal ligation  . TONSILLECTOMY    . TUBAL LIGATION      Family History  Problem  Relation Age of Onset  . Heart attack Mother 28  . Cancer Father 26       colon    No Known Allergies  Current Outpatient Prescriptions on File Prior to Visit  Medication Sig Dispense Refill  . alendronate (FOSAMAX) 70 MG tablet Take 1 tablet (70 mg total) by mouth once a week. Take with a full glass of water on an empty stomach. 12 tablet 4  . atorvastatin (LIPITOR) 40 MG tablet Take 1 tablet (40 mg total) by mouth daily. 90 tablet 3  . carbamide peroxide (DEBROX) 6.5 % otic solution Place 5 drops into both ears 2 (two) times daily as needed. 15 mL 0  . cetirizine (ZYRTEC ALLERGY) 10 MG tablet Take 1 tablet (10 mg total) by mouth daily. 30 tablet 30  . Cholecalciferol (VITAMIN D) 2000 units CAPS Take 2,000 Units by mouth daily.     . meloxicam (MOBIC) 7.5 MG tablet Take 1 tablet (7.5 mg total) by mouth daily as needed for pain. With food 14 tablet 0   No current facility-administered medications on file prior to visit.     BP 132/80 (BP Location: Left Arm, Patient Position: Sitting, Cuff Size: Normal)   Pulse 78  Temp 97.8 F (36.6 C) (Oral)   Resp 16   Ht 5\' 4"  (1.626 m)   Wt 160 lb 6.4 oz (72.8 kg)   SpO2 97%   BMI 27.53 kg/m       Objective:   Physical Exam  Constitutional: She is oriented to person, place, and time. She appears well-developed and well-nourished. She does not appear ill. No distress.  HENT:  Head: Normocephalic and atraumatic.  Right Ear: External ear normal. No drainage or tenderness. No decreased hearing is noted.  Left Ear: External ear normal. No drainage or tenderness. No decreased hearing is noted.  Nose: Nose normal.  Mouth/Throat: Uvula is midline, oropharynx is clear and moist and mucous membranes are normal.  Copious cerumen, no impaction.  Cardiovascular: Normal rate, regular rhythm, normal heart sounds and intact distal pulses.   Pulmonary/Chest: Effort normal and breath sounds normal.  Musculoskeletal: Normal range of motion. She  exhibits no edema, tenderness or deformity.  Negative straight leg raise.  Neurological: She is alert and oriented to person, place, and time. She has normal strength. Coordination normal.  Skin: Skin is warm and dry.  Psychiatric: She has a normal mood and affect. Judgment and thought content normal.      Assessment & Plan:  Viral URI- Will treat with OTC regimen-zyrtec-d for 1 week, afrin for no more than 3 days, hot water with honey. She will rest and increase fluids. Advised to return for fevers over 101, productive cough, worsening symptoms or symptoms that have not resolved in 1 week.  H/O impacted cerumen- Copious cerumen noted in bilateral ear canals. No impaction. She denies pain, hearing changes. Shell try debrox at home.

## 2017-08-12 NOTE — Patient Instructions (Addendum)
Please head downstairs for an x-ray of your lower back. I'd like for you to try incorporating the back exercises into your routine. You may try tylenol as needed for your back pain.  You can try zyrtec-d for the next week to help with the congestion, resume regular zytrec after that. You may also try a course of afrin nasal spray, but do not use this for more than 3 days. Honey with warm water may soothe your throat and help your cough. Drink plenty of fluids and stay hydrated.   Upper Respiratory Infection, Adult Most upper respiratory infections (URIs) are caused by a virus. A URI affects the nose, throat, and upper air passages. The most common type of URI is often called "the common cold." Follow these instructions at home:  Take medicines only as told by your doctor.  Gargle warm saltwater or take cough drops to comfort your throat as told by your doctor.  Use a warm mist humidifier or inhale steam from a shower to increase air moisture. This may make it easier to breathe.  Drink enough fluid to keep your pee (urine) clear or pale yellow.  Eat soups and other clear broths.  Have a healthy diet.  Rest as needed.  Go back to work when your fever is gone or your doctor says it is okay. ? You may need to stay home longer to avoid giving your URI to others. ? You can also wear a face mask and wash your hands often to prevent spread of the virus.  Use your inhaler more if you have asthma.  Do not use any tobacco products, including cigarettes, chewing tobacco, or electronic cigarettes. If you need help quitting, ask your doctor. Contact a doctor if:  You are getting worse, not better.  Your symptoms are not helped by medicine.  You have chills.  You are getting more short of breath.  You have brown or red mucus.  You have yellow or brown discharge from your nose.  You have pain in your face, especially when you bend forward.  You have a fever.  You have puffy (swollen)  neck glands.  You have pain while swallowing.  You have white areas in the back of your throat. Get help right away if:  You have very bad or constant: ? Headache. ? Ear pain. ? Pain in your forehead, behind your eyes, and over your cheekbones (sinus pain). ? Chest pain.  You have long-lasting (chronic) lung disease and any of the following: ? Wheezing. ? Long-lasting cough. ? Coughing up blood. ? A change in your usual mucus.  You have a stiff neck.  You have changes in your: ? Vision. ? Hearing. ? Thinking. ? Mood. This information is not intended to replace advice given to you by your health care provider. Make sure you discuss any questions you have with your health care provider. Document Released: 03/26/2008 Document Revised: 06/10/2016 Document Reviewed: 01/13/2014 Elsevier Interactive Patient Education  2018 [employer].

## 2017-08-12 NOTE — Addendum Note (Signed)
Addended by: Lance Sell on: 08/12/2017 02:20 PM   Modules accepted: Level of Service

## 2017-08-12 NOTE — Assessment & Plan Note (Addendum)
Persistent for over 5 months now. She was treated with a course of mobic which helped but pain persists. She has not tried any of the back exercises that she was given on prior visit for back pain. Lumbar x-ray ordered. She will try the back exercises and tylenol prn for pain at home. Instructed to take no more than 3g tylenol in one day.

## 2017-08-13 ENCOUNTER — Encounter: Payer: Self-pay | Admitting: Nurse Practitioner

## 2017-08-13 DIAGNOSIS — M5136 Other intervertebral disc degeneration, lumbar region: Secondary | ICD-10-CM

## 2017-08-13 DIAGNOSIS — M51369 Other intervertebral disc degeneration, lumbar region without mention of lumbar back pain or lower extremity pain: Secondary | ICD-10-CM

## 2017-08-13 HISTORY — DX: Other intervertebral disc degeneration, lumbar region without mention of lumbar back pain or lower extremity pain: M51.369

## 2017-08-13 HISTORY — DX: Other intervertebral disc degeneration, lumbar region: M51.36

## 2017-08-14 ENCOUNTER — Telehealth: Payer: Self-pay | Admitting: Nurse Practitioner

## 2017-08-14 DIAGNOSIS — M5136 Other intervertebral disc degeneration, lumbar region: Secondary | ICD-10-CM

## 2017-08-14 NOTE — Telephone Encounter (Signed)
error 

## 2017-08-14 NOTE — Telephone Encounter (Signed)
I have placed the referral for PT

## 2017-08-14 NOTE — Telephone Encounter (Signed)
-----   Message from Lorrin Jackson, [place] sent at 08/13/2017  4:06 PM EDT ----- Pt aware of results. Pt is interested in PT.

## 2017-08-19 ENCOUNTER — Telehealth: Payer: Self-pay | Admitting: Nurse Practitioner

## 2017-08-19 NOTE — Telephone Encounter (Signed)
Patient has dropped off a form that needs to be completed. I was informed she needs a AWV to have this completed. Left patient a VM to inform.   When she calls back have her set up an AWV with Sharee Pimple, in the appointment notes make sure to put form to be completed. Thank you.

## 2017-08-19 NOTE — Telephone Encounter (Signed)
Patient called back and stated she had a AWV at home. They are the ones who gave her the form and said her PCP had to finish it. Can we complete the form then? Please advise. Thank you.

## 2017-08-19 NOTE — Telephone Encounter (Signed)
Patient has set up an appointment for Nov 5th. Form has been given to [place].

## 2017-08-19 NOTE — Telephone Encounter (Signed)
She has to have a face to face AWV with Sharee Pimple to finish it.

## 2017-08-19 NOTE — Telephone Encounter (Signed)
Pt called stating that she had an xray on 08/12/17. She said that she was given the results but would like to see the xray and discuss it further. Please advise.

## 2017-08-23 NOTE — Progress Notes (Signed)
Subjective:   Sharon Robinson is a 71 y.o. female who presents for an Initial Medicare Annual Wellness Visit.  Review of Systems    No ROS.  Medicare Wellness Visit. Additional risk factors are reflected in the social history.  Cardiac Risk Factors include: advanced age (>27men, >71 women);dyslipidemia;hypertension Sleep patterns: feels rested on waking, gets up 1-2 times nightly to void and sleeps 6-8 hours nightly.    Home Safety/Smoke Alarms: Feels safe in home. Smoke alarms in place.  Living environment; residence and Firearm Safety: 1-story house/ trailer, no firearms Lives alone, no needs for DME, good support system. Seat Belt Safety/Bike Helmet: Wears seat belt.    Objective:    Today's Vitals   08/26/17 1531 08/26/17 1532  BP: 128/64   Pulse: 78   Resp: 20   SpO2: 100%   Weight: 157 lb (71.2 kg)   Height: 5\' 4"  (1.626 m)   PainSc:  1    Body mass index is 26.95 kg/m.   Current Medications (verified) Outpatient Encounter Medications as of 08/26/2017  Medication Sig  . acetaminophen (TYLENOL) 325 MG tablet Take 650 mg every 6 (six) hours as needed by mouth.  Marland Kitchen alendronate (FOSAMAX) 70 MG tablet Take 1 tablet (70 mg total) by mouth once a week. Take with a full glass of water on an empty stomach.  Marland Kitchen atorvastatin (LIPITOR) 40 MG tablet Take 1 tablet (40 mg total) by mouth daily.  . carbamide peroxide (DEBROX) 6.5 % otic solution Place 5 drops into both ears 2 (two) times daily as needed.  . cetirizine (ZYRTEC ALLERGY) 10 MG tablet Take 1 tablet (10 mg total) by mouth daily.  . Cholecalciferol (VITAMIN D) 2000 units CAPS Take 2,000 Units by mouth daily.   Marland Kitchen ketoconazole (NIZORAL) 2 % cream Apply 1 application daily topically.  . meloxicam (MOBIC) 7.5 MG tablet Take 1 tablet (7.5 mg total) by mouth daily as needed for pain. With food   No facility-administered encounter medications on file as of 08/26/2017.     Allergies (verified) Patient has no known allergies.    History: Past Medical History:  Diagnosis Date  . Degenerative disc disease, lumbar 08/13/2017   Xray on 08/12/17  . DI (detrusor instability)   . GERD (gastroesophageal reflux disease)   . H/O vitamin D deficiency   . Hyperlipidemia   . Osteopenia   . Sinusitis    related to smoking  . STD (sexually transmitted disease)    HSV 2   Past Surgical History:  Procedure Laterality Date  . CATARACT EXTRACTION Left    Jun 20 2015  . OTHER SURGICAL HISTORY     T&A age 1  . OTHER SURGICAL HISTORY  1989   tubal ligation  . TONSILLECTOMY    . TUBAL LIGATION     Family History  Problem Relation Age of Onset  . Heart attack Mother 51  . Cancer Father 17       colon   Social History   Occupational History  . Not on file  Tobacco Use  . Smoking status: Current Every Day Smoker    Packs/day: 0.50    Types: Cigarettes  . Smokeless tobacco: Never Used  Substance and Sexual Activity  . Alcohol use: Yes    Alcohol/week: 0.0 oz    Comment: rare  . Drug use: No  . Sexual activity: No    Birth control/protection: Surgical, Post-menopausal    Tobacco Counseling Ready to quit: Not Answered Counseling given: Not  Answered   Activities of Daily Living In your present state of health, do you have any difficulty performing the following activities: 08/26/2017  Hearing? N  Vision? N  Difficulty concentrating or making decisions? N  Walking or climbing stairs? N  Dressing or bathing? N  Doing errands, shopping? N  Preparing Food and eating ? N  Using the Toilet? N  In the past six months, have you accidently leaked urine? N  Do you have problems with loss of bowel control? N  Managing your Medications? N  Managing your Finances? N  Housekeeping or managing your Housekeeping? N  Some recent data might be hidden    Immunizations and Health Maintenance Immunization History  Administered Date(s) Administered  . Pneumococcal Conjugate-13 12/25/2016  . Tdap 05/06/2014  .  Zoster 05/20/2013   Health Maintenance Due  Topic Date Due  . COLONOSCOPY  03/21/1994    Patient Care Team: Sharon Sell, NP as PCP - General (Nurse Practitioner)  Indicate any recent Medical Services you may have received from other than [facility] providers in the past year (date may be approximate).     Assessment:   This is a routine wellness examination for Sharon Robinson. Physical assessment deferred to PCP.   Hearing/Vision screen Hearing Screening Comments: Able to hear conversational tones w/o difficulty. No issues reported.  Passed whisper test Vision Screening Comments: appointment yearly Sharon Robinson   Dietary issues and exercise activities discussed: Current Exercise Habits: Home exercise routine;Structured exercise class, Type of exercise: calisthenics;stretching;walking;yoga, Time (Minutes): 55, Frequency (Times/Week): 3, Weekly Exercise (Minutes/Week): 165, Intensity: Mild, Exercise limited by: orthopedic condition(s)  Diet (meal preparation, eat out, water intake, caffeinated beverages, dairy products, fruits and vegetables): in general, a "healthy" diet  , well balanced, eats a variety of fruits and vegetables daily, limits salt, fat/cholesterol, sugar, caffeine, drinks 6-8 glasses of water daily.      Goals    None     Depression Screen PHQ 2/9 Scores 08/26/2017 12/25/2016  PHQ - 2 Score 0 0  PHQ- 9 Score 0 -    Fall Risk Fall Risk  08/26/2017 12/25/2016  Falls in the past year? No No    Cognitive Function: MMSE - Mini Mental State Exam 08/26/2017  Orientation to time 5  Orientation to Place 5        Screening Tests Health Maintenance  Topic Date Due  . COLONOSCOPY  03/21/1994  . INFLUENZA VACCINE  01/20/2018 (Originally 05/22/2017)  . PNA vac Low Risk Adult (2 of 2 - PPSV23) 12/25/2017  . MAMMOGRAM  01/22/2019  . PAP SMEAR  06/11/2019  . TETANUS/TDAP  05/06/2024  . DEXA SCAN  Completed  . Hepatitis C Screening  Completed      Plan:    Continue  doing brain stimulating activities (puzzles, reading, adult coloring books, staying active) to keep memory sharp.   Continue to eat heart healthy diet (full of fruits, vegetables, whole grains, lean protein, water--limit salt, fat, and sugar intake) and increase physical activity as tolerated.   I have personally reviewed and noted the following in the patient's chart:   . Medical and social history . Use of alcohol, tobacco or illicit drugs  . Current medications and supplements . Functional ability and status . Nutritional status . Physical activity . Advanced directives . List of other physicians . Vitals . Screenings to include cognitive, depression, and falls . Referrals and appointments  In addition, I have reviewed and discussed with patient certain preventive protocols, quality metrics,  and best practice recommendations. A written personalized care plan for preventive services as well as general preventive health recommendations were provided to patient.     Michiel Cowboy, RN   08/26/2017

## 2017-08-23 NOTE — Progress Notes (Signed)
Pre visit review using our clinic review tool, if applicable. No additional management support is needed unless otherwise documented below in the visit note. 

## 2017-08-26 ENCOUNTER — Ambulatory Visit (INDEPENDENT_AMBULATORY_CARE_PROVIDER_SITE_OTHER): Payer: Medicare Other | Admitting: *Deleted

## 2017-08-26 VITALS — BP 128/64 | HR 78 | Resp 20 | Ht 64.0 in | Wt 157.0 lb

## 2017-08-26 DIAGNOSIS — Z0000 Encounter for general adult medical examination without abnormal findings: Secondary | ICD-10-CM | POA: Diagnosis not present

## 2017-08-26 NOTE — Telephone Encounter (Signed)
Pt discussed with Sharee Pimple at [facility]. Info was printed off and given to pt.

## 2017-08-26 NOTE — Patient Instructions (Addendum)
Continue doing brain stimulating activities (puzzles, reading, adult coloring books, staying active) to keep memory sharp.   Continue to eat heart healthy diet (full of fruits, vegetables, whole grains, lean protein, water--limit salt, fat, and sugar intake) and increase physical activity as tolerated.   [name] , Thank you for taking time to come for your Medicare Wellness Visit. I appreciate your ongoing commitment to your health goals. Please review the following plan we discussed and let me know if I can assist you in the future.   These are the goals we discussed: Goals    None      This is a list of the screening recommended for you and due dates:  Health Maintenance  Topic Date Due  . Colon Cancer Screening  03/21/1994  . Flu Shot  01/20/2018*  . Pneumonia vaccines (2 of 2 - PPSV23) 12/25/2017  . Mammogram  01/22/2019  . Pap Smear  06/11/2019  . Tetanus Vaccine  05/06/2024  . DEXA scan (bone density measurement)  Completed  .  Hepatitis C: One time screening is recommended by Center for Disease Control  (CDC) for  adults born from [birth year] through 1967.   Completed  *Topic was postponed. The date shown is not the original due date.     Back Exercises If you have pain in your back, do these exercises 2-3 times each day or as told by your doctor. When the pain goes away, do the exercises once each day, but repeat the steps more times for each exercise (do more repetitions). If you do not have pain in your back, do these exercises once each day or as told by your doctor. Exercises Single Knee to Chest  Do these steps 3-5 times in a row for each leg: 1. Lie on your back on a firm bed or the floor with your legs stretched out. 2. Bring one knee to your chest. 3. Hold your knee to your chest by grabbing your knee or thigh. 4. Pull on your knee until you feel a gentle stretch in your lower back. 5. Keep doing the stretch for 10-30 seconds. 6. Slowly let go of your leg and  straighten it.  Pelvic Tilt  Do these steps 5-10 times in a row: 1. Lie on your back on a firm bed or the floor with your legs stretched out. 2. Bend your knees so they point up to the ceiling. Your feet should be flat on the floor. 3. Tighten your lower belly (abdomen) muscles to press your lower back against the floor. This will make your tailbone point up to the ceiling instead of pointing down to your feet or the floor. 4. Stay in this position for 5-10 seconds while you gently tighten your muscles and breathe evenly.  Cat-Cow  Do these steps until your lower back bends more easily: 1. Get on your hands and knees on a firm surface. Keep your hands under your shoulders, and keep your knees under your hips. You may put padding under your knees. 2. Let your head hang down, and make your tailbone point down to the floor so your lower back is round like the back of a cat. 3. Stay in this position for 5 seconds. 4. Slowly lift your head and make your tailbone point up to the ceiling so your back hangs low (sags) like the back of a cow. 5. Stay in this position for 5 seconds.  Press-Ups  Do these steps 5-10 times in a row: 1.  Lie on your belly (face-down) on the floor. 2. Place your hands near your head, about shoulder-width apart. 3. While you keep your back relaxed and keep your hips on the floor, slowly straighten your arms to raise the top half of your body and lift your shoulders. Do not use your back muscles. To make yourself more comfortable, you may change where you place your hands. 4. Stay in this position for 5 seconds. 5. Slowly return to lying flat on the floor.  Bridges  Do these steps 10 times in a row: 1. Lie on your back on a firm surface. 2. Bend your knees so they point up to the ceiling. Your feet should be flat on the floor. 3. Tighten your butt muscles and lift your butt off of the floor until your waist is almost as high as your knees. If you do not feel the  muscles working in your butt and the back of your thighs, slide your feet 1-2 inches farther away from your butt. 4. Stay in this position for 3-5 seconds. 5. Slowly lower your butt to the floor, and let your butt muscles relax.  If this exercise is too easy, try doing it with your arms crossed over your chest. Belly Crunches  Do these steps 5-10 times in a row: 1. Lie on your back on a firm bed or the floor with your legs stretched out. 2. Bend your knees so they point up to the ceiling. Your feet should be flat on the floor. 3. Cross your arms over your chest. 4. Tip your chin a little bit toward your chest but do not bend your neck. 5. Tighten your belly muscles and slowly raise your chest just enough to lift your shoulder blades a tiny bit off of the floor. 6. Slowly lower your chest and your head to the floor.  Back Lifts Do these steps 5-10 times in a row: 1. Lie on your belly (face-down) with your arms at your sides, and rest your forehead on the floor. 2. Tighten the muscles in your legs and your butt. 3. Slowly lift your chest off of the floor while you keep your hips on the floor. Keep the back of your head in line with the curve in your back. Look at the floor while you do this. 4. Stay in this position for 3-5 seconds. 5. Slowly lower your chest and your face to the floor.  Contact a doctor if:  Your back pain gets a lot worse when you do an exercise.  Your back pain does not lessen 2 hours after you exercise. If you have any of these problems, stop doing the exercises. Do not do them again unless your doctor says it is okay. Get help right away if:  You have sudden, very bad back pain. If this happens, stop doing the exercises. Do not do them again unless your doctor says it is okay. This information is not intended to replace advice given to you by your health care provider. Make sure you discuss any questions you have with your health care provider. Document Released:  11/10/2010 Document Revised: 03/15/2016 Document Reviewed: 12/02/2014 Elsevier Interactive Patient Education  2018 [place] Maintenance, Female Adopting a healthy lifestyle and getting preventive care can go a long way to promote health and wellness. Talk with your health care provider about what schedule of regular examinations is right for you. This is a good chance for you to check in with your provider about  disease prevention and staying healthy. In between checkups, there are plenty of things you can do on your own. Experts have done a lot of research about which lifestyle changes and preventive measures are most likely to keep you healthy. Ask your health care provider for more information. Weight and diet Eat a healthy diet  Be sure to include plenty of vegetables, fruits, low-fat dairy products, and lean protein.  Do not eat a lot of foods high in solid fats, added sugars, or salt.  Get regular exercise. This is one of the most important things you can do for your health. ? Most adults should exercise for at least 150 minutes each week. The exercise should increase your heart rate and make you sweat (moderate-intensity exercise). ? Most adults should also do strengthening exercises at least twice a week. This is in addition to the moderate-intensity exercise.  Maintain a healthy weight  Body mass index (BMI) is a measurement that can be used to identify possible weight problems. It estimates body fat based on height and weight. Your health care provider can help determine your BMI and help you achieve or maintain a healthy weight.  For females 55 years of age and older: ? A BMI below 18.5 is considered underweight. ? A BMI of 18.5 to 24.9 is normal. ? A BMI of 25 to 29.9 is considered overweight. ? A BMI of 30 and above is considered obese.  Watch levels of cholesterol and blood lipids  You should start having your blood tested for lipids and cholesterol at 73  years of age, then have this test every 5 years.  You may need to have your cholesterol levels checked more often if: ? Your lipid or cholesterol levels are high. ? You are older than 73 years of age. ? You are at high risk for heart disease.  Cancer screening Lung Cancer  Lung cancer screening is recommended for adults 71-44 years old who are at high risk for lung cancer because of a history of smoking.  A yearly low-dose CT scan of the lungs is recommended for people who: ? Currently smoke. ? Have quit within the past 15 years. ? Have at least a 30-pack-year history of smoking. A pack year is smoking an average of one pack of cigarettes a day for 1 year.  Yearly screening should continue until it has been 15 years since you quit.  Yearly screening should stop if you develop a health problem that would prevent you from having lung cancer treatment.  Breast Cancer  Practice breast self-awareness. This means understanding how your breasts normally appear and feel.  It also means doing regular breast self-exams. Let your health care provider know about any changes, no matter how small.  If you are in your 20s or 30s, you should have a clinical breast exam (CBE) by a health care provider every 1-3 years as part of a regular health exam.  If you are 32 or older, have a CBE every year. Also consider having a breast X-ray (mammogram) every year.  If you have a family history of breast cancer, talk to your health care provider about genetic screening.  If you are at high risk for breast cancer, talk to your health care provider about having an MRI and a mammogram every year.  Breast cancer gene (BRCA) assessment is recommended for women who have family members with BRCA-related cancers. BRCA-related cancers include: ? Breast. ? Ovarian. ? Tubal. ? Peritoneal cancers.  Results of the  assessment will determine the need for genetic counseling and BRCA1 and BRCA2 testing.  Cervical  Cancer Your health care provider may recommend that you be screened regularly for cancer of the pelvic organs (ovaries, uterus, and vagina). This screening involves a pelvic examination, including checking for microscopic changes to the surface of your cervix (Pap test). You may be encouraged to have this screening done every 3 years, beginning at age 71.  For women ages 64-65, health care providers may recommend pelvic exams and Pap testing every 3 years, or they may recommend the Pap and pelvic exam, combined with testing for human papilloma virus (HPV), every 5 years. Some types of HPV increase your risk of cervical cancer. Testing for HPV may also be done on women of any age with unclear Pap test results.  Other health care providers may not recommend any screening for nonpregnant women who are considered low risk for pelvic cancer and who do not have symptoms. Ask your health care provider if a screening pelvic exam is right for you.  If you have had past treatment for cervical cancer or a condition that could lead to cancer, you need Pap tests and screening for cancer for at least 20 years after your treatment. If Pap tests have been discontinued, your risk factors (such as having a new sexual partner) need to be reassessed to determine if screening should resume. Some women have medical problems that increase the chance of getting cervical cancer. In these cases, your health care provider may recommend more frequent screening and Pap tests.  Colorectal Cancer  This type of cancer can be detected and often prevented.  Routine colorectal cancer screening usually begins at 73 years of age and continues through 73 years of age.  Your health care provider may recommend screening at an earlier age if you have risk factors for colon cancer.  Your health care provider may also recommend using home test kits to check for hidden blood in the stool.  A small camera at the end of a tube can be used to  examine your colon directly (sigmoidoscopy or colonoscopy). This is done to check for the earliest forms of colorectal cancer.  Routine screening usually begins at age 11.  Direct examination of the colon should be repeated every 5-10 years through 73 years of age. However, you may need to be screened more often if early forms of precancerous polyps or small growths are found.  Skin Cancer  Check your skin from head to toe regularly.  Tell your health care provider about any new moles or changes in moles, especially if there is a change in a mole's shape or color.  Also tell your health care provider if you have a mole that is larger than the size of a pencil eraser.  Always use sunscreen. Apply sunscreen liberally and repeatedly throughout the day.  Protect yourself by wearing long sleeves, pants, a wide-brimmed hat, and sunglasses whenever you are outside.  Heart disease, diabetes, and high blood pressure  High blood pressure causes heart disease and increases the risk of stroke. High blood pressure is more likely to develop in: ? People who have blood pressure in the high end of the normal range (130-139/85-89 mm Hg). ? People who are overweight or obese. ? People who are African American.  If you are 75-82 years of age, have your blood pressure checked every 3-5 years. If you are 60 years of age or older, have your blood pressure checked every year.  You should have your blood pressure measured twice-once when you are at a hospital or clinic, and once when you are not at a hospital or clinic. Record the average of the two measurements. To check your blood pressure when you are not at a hospital or clinic, you can use: ? An automated blood pressure machine at a pharmacy. ? A home blood pressure monitor.  If you are between 67 years and 70 years old, ask your health care provider if you should take aspirin to prevent strokes.  Have regular diabetes screenings. This involves taking a  blood sample to check your fasting blood sugar level. ? If you are at a normal weight and have a low risk for diabetes, have this test once every three years after 73 years of age. ? If you are overweight and have a high risk for diabetes, consider being tested at a younger age or more often. Preventing infection Hepatitis B  If you have a higher risk for hepatitis B, you should be screened for this virus. You are considered at high risk for hepatitis B if: ? You were born in a country where hepatitis B is common. Ask your health care provider which countries are considered high risk. ? Your parents were born in a high-risk country, and you have not been immunized against hepatitis B (hepatitis B vaccine). ? You have HIV or AIDS. ? You use needles to inject street drugs. ? You live with someone who has hepatitis B. ? You have had sex with someone who has hepatitis B. ? You get hemodialysis treatment. ? You take certain medicines for conditions, including cancer, organ transplantation, and autoimmune conditions.  Hepatitis C  Blood testing is recommended for: ? Everyone born from [birth year] through 1967. ? Anyone with known risk factors for hepatitis C.  Sexually transmitted infections (STIs)  You should be screened for sexually transmitted infections (STIs) including gonorrhea and chlamydia if: ? You are sexually active and are younger than 73 years of age. ? You are older than 73 years of age and your health care provider tells you that you are at risk for this type of infection. ? Your sexual activity has changed since you were last screened and you are at an increased risk for chlamydia or gonorrhea. Ask your health care provider if you are at risk.  If you do not have HIV, but are at risk, it may be recommended that you take a prescription medicine daily to prevent HIV infection. This is called pre-exposure prophylaxis (PrEP). You are considered at risk if: ? You are sexually active and  do not regularly use condoms or know the HIV status of your partner(s). ? You take drugs by injection. ? You are sexually active with a partner who has HIV.  Talk with your health care provider about whether you are at high risk of being infected with HIV. If you choose to begin PrEP, you should first be tested for HIV. You should then be tested every 3 months for as long as you are taking PrEP. Pregnancy  If you are premenopausal and you may become pregnant, ask your health care provider about preconception counseling.  If you may become pregnant, take 400 to 800 micrograms (mcg) of folic acid every day.  If you want to prevent pregnancy, talk to your health care provider about birth control (contraception). Osteoporosis and menopause  Osteoporosis is a disease in which the bones lose minerals and strength with aging. This can result in  serious bone fractures. Your risk for osteoporosis can be identified using a bone density scan.  If you are 89 years of age or older, or if you are at risk for osteoporosis and fractures, ask your health care provider if you should be screened.  Ask your health care provider whether you should take a calcium or vitamin D supplement to lower your risk for osteoporosis.  Menopause may have certain physical symptoms and risks.  Hormone replacement therapy may reduce some of these symptoms and risks. Talk to your health care provider about whether hormone replacement therapy is right for you. Follow these instructions at home:  Schedule regular health, dental, and eye exams.  Stay current with your immunizations.  Do not use any tobacco products including cigarettes, chewing tobacco, or electronic cigarettes.  If you are pregnant, do not drink alcohol.  If you are breastfeeding, limit how much and how often you drink alcohol.  Limit alcohol intake to no more than 1 drink per day for nonpregnant women. One drink equals 12 ounces of beer, 5 ounces of  wine, or 1 ounces of hard liquor.  Do not use street drugs.  Do not share needles.  Ask your health care provider for help if you need support or information about quitting drugs.  Tell your health care provider if you often feel depressed.  Tell your health care provider if you have ever been abused or do not feel safe at home. This information is not intended to replace advice given to you by your health care provider. Make sure you discuss any questions you have with your health care provider. Document Released: 04/23/2011 Document Revised: 03/15/2016 Document Reviewed: 07/12/2015 Elsevier Interactive Patient Education  [employer].

## 2017-08-27 NOTE — Progress Notes (Signed)
Medical screening examination/treatment/procedure(s) were performed by the Wellness Coach, RN. As primary care provider I was immediately available for consulation/collaboration. I agree with above documentation. Trayton Szabo, NP  

## 2017-09-03 DIAGNOSIS — Z1212 Encounter for screening for malignant neoplasm of rectum: Secondary | ICD-10-CM | POA: Diagnosis not present

## 2017-09-03 DIAGNOSIS — Z1211 Encounter for screening for malignant neoplasm of colon: Secondary | ICD-10-CM | POA: Diagnosis not present

## 2017-09-03 LAB — COLOGUARD: Cologuard: NEGATIVE

## 2017-10-08 ENCOUNTER — Encounter: Payer: Self-pay | Admitting: Physical Therapy

## 2017-10-08 ENCOUNTER — Ambulatory Visit: Payer: Medicare Other | Attending: Nurse Practitioner | Admitting: Physical Therapy

## 2017-10-08 DIAGNOSIS — M62838 Other muscle spasm: Secondary | ICD-10-CM | POA: Diagnosis not present

## 2017-10-08 DIAGNOSIS — M545 Low back pain, unspecified: Secondary | ICD-10-CM

## 2017-10-08 DIAGNOSIS — M6281 Muscle weakness (generalized): Secondary | ICD-10-CM | POA: Insufficient documentation

## 2017-10-08 NOTE — Patient Instructions (Addendum)
Therapeutic - Bridging    Lift buttocks, keeping back straight and arms on floor. Hold ___1_ seconds. Repeat _10___ times.  Copyright  VHI. All rights reserved.  Knee-to-Chest Stretch: Unilateral    With hand behind right knee, pull knee in to chest until a comfortable stretch is felt in lower back and buttocks. Keep back relaxed. Hold _30___ seconds. Repeat __10__ times per set. Do __1__ sets per session. Do __1__ sessions per day.  http://orth.exer.us/126   Copyright  VHI. All rights reserved.  Chair Sitting    Sit at edge of seat, spine straight, one leg extended. Put a hand on each thigh and bend forward from the hip, keeping spine straight. Allow hand on extended leg to reach toward toes. Support upper body with other arm. Hold _30__ seconds. Repeat __1_ times per session. Do __1_ sessions per day.  Copyright  VHI. All rights reserved.     Start in a supine position, one leg bent with foot flat on the bed/floor, and the other leg crossed over the knee.  Gently push the knee of the crossed leg forward until a strong yet pain-free stretch is felt.  Hold for 30 seconds then release.  Repeat as many sets as instructed, then switch sides and repeat.  [facility] [address] [place], [place] [address] Phone # [phone] Fax [phone]

## 2017-10-08 NOTE — Therapy (Signed)
[facility] [address], [place], [place], [address] Phone: [phone]   Fax:  [phone]  Physical Therapy Evaluation  Patient Details  Name: Sharon Robinson MRN: [id number] Date of Birth: 12-20-1945 Referring Provider: Dr. Caesar Chestnut   Encounter Date: 10/08/2017  PT End of Session - 10/08/17 1505    Visit Number  1    Date for PT Re-Evaluation  12/03/17    Authorization Type  [employer] Medicare; KX on 15th visit; 10th visit is g-code    PT Start Time  1430    PT Stop Time  1510    PT Time Calculation (min)  40 min    Activity Tolerance  Patient tolerated treatment well    Behavior During Therapy  [facility] for tasks assessed/performed       Past Medical History:  Diagnosis Date  . Degenerative disc disease, lumbar 08/13/2017   Xray on 08/12/17  . DI (detrusor instability)   . GERD (gastroesophageal reflux disease)   . H/O vitamin D deficiency   . Hyperlipidemia   . Osteopenia   . Sinusitis    related to smoking  . STD (sexually transmitted disease)    HSV 2    Past Surgical History:  Procedure Laterality Date  . CATARACT EXTRACTION Left    Jun 20 2015  . OTHER SURGICAL HISTORY     T&A age 12  . OTHER SURGICAL HISTORY  1989   tubal ligation  . TONSILLECTOMY    . TUBAL LIGATION      There were no vitals filed for this visit.   Subjective Assessment - 10/08/17 1431    Subjective  Patient has a pain in left low back 3 months ago. Pain came on suddenly. No numbness or tingling down legs.     How long can you sit comfortably?  depends on chair    Patient Stated Goals  how exercise correctly; reduce pain    Currently in Pain?  Yes    Pain Score  5     Pain Location  Back    Pain Orientation  Left;Lower    Pain Descriptors / Indicators  Sharp    Pain Type  Acute pain    Pain Onset  More than a month ago    Pain Frequency  Intermittent    Aggravating Factors   sitting    Pain Relieving Factors   Tylenol    Multiple Pain Sites  No         [facility] PT Assessment - 10/08/17 0001      Assessment   Medical Diagnosis  M51.36 Degenerative disc disease, lumbar    Referring Provider  Dr. Caesar Chestnut    Onset Date/Surgical Date  07/09/17    Prior Therapy  None      Precautions   Precautions  Other (comment)    Precaution Comments  osteoporosis      Restrictions   Weight Bearing Restrictions  No      Balance Screen   Has the patient fallen in the past 6 months  Yes    How many times?  1 missed step in the snow    Has the patient had a decrease in activity level because of a fear of falling?   No    Is the patient reluctant to leave their home because of a fear of falling?   No      Home Film/video editor residence  Prior Function   Level of Independence  Independent      Cognition   Overall Cognitive Status  Within Functional Limits for tasks assessed      Observation/Other Assessments   Focus on Therapeutic Outcomes (FOTO)   46% limited; goal is 31% limitation      Posture/Postural Control   Posture/Postural Control  Postural limitations    Postural Limitations  Rounded Shoulders;Forward head;Increased thoracic kyphosis;Decreased lumbar lordosis      ROM / Strength   AROM / PROM / Strength  AROM;PROM;Strength      AROM   Lumbar Extension  decreased by 75%      PROM   Right Hip External Rotation   22    Left Hip External Rotation   35      Strength   Right Hip ABduction  3+/5    Left Hip ABduction  3+/5    Right Knee Flexion  4/5    Right Knee Extension  4/5    Left Knee Flexion  4/5    Left Knee Extension  4/5             Objective measurements completed on examination: See above findings.              PT Education - 10/08/17 1505    Education provided  Yes    Education Details  stretches for back and hip    Person(s) Educated  Patient    Methods  Explanation;Demonstration;Handout    Comprehension   Verbalized understanding;Returned demonstration       PT Short Term Goals - 10/08/17 1520      PT SHORT TERM GOAL #1   Title  independent with her stretches for her back    Time  4    Period  Weeks    Status  New    Target Date  11/05/17      PT SHORT TERM GOAL #2   Title  understand postures to avoid due to osteoporosis and decrease strain on lumbar    Time  4    Period  Weeks    Status  New    Target Date  11/05/17      PT SHORT TERM GOAL #3   Title  able to return demonstration of correct body mechanics with daily tasks to decrease strain on lumbar    Time  4    Period  Weeks    Status  New    Target Date  11/05/17        PT Long Term Goals - 10/08/17 1510      PT LONG TERM GOAL #1   Title  independent with HEP and how to progress herlsef    Time  8    Period  Weeks    Status  New    Target Date  12/03/17      PT LONG TERM GOAL #2   Title  walking in [place] for 30 min. with decreased lumbar pain >/= 50%    Time  8    Period  Weeks    Status  New    Target Date  12/03/17      PT LONG TERM GOAL #3   Title  understand correct body mechanincs with  daily tasks to decresaed strain on back    Time  8    Period  Weeks    Status  New    Target Date  12/03/17      PT LONG TERM  GOAL #4   Title  understand how to exercise at the gym with correct body mechanics to decrease strain on back    Time  8    Period  Weeks    Status  New    Target Date  12/03/17      PT LONG TERM GOAL #5   Title  FOTO score </= 31% limitation    Time  8    Period  Weeks    Status  New    Target Date  12/03/17             Plan - 10/08/17 1524    Clinical Impression Statement  Patient is a 72 year old female with sudden onset of left low back pain 3 months ago at level 5/10. Patient pain is worse with sitting.  Patient reports no numbness or tingling in legs.  Lumbar extension decreased by 75%.  Patient has weakness in  bilateral hips.  Patient has palpabel tenderness located  in left low back area.  Patient presents with increased thoracic kyphosis, increased abdominal protrusion due to weakness and flat lumbar spine.  Patient will benefit from skilled therapy to improve core strength and posture to reduce strain on the lumbar area.      History and Personal Factors relevant to plan of care:  osteoporosis    Clinical Presentation  Stable    Clinical Presentation due to:  stable condition    Clinical Decision Making  Low    Rehab Potential  Excellent    Clinical Impairments Affecting Rehab Potential  Osteoporosis    PT Frequency  1x / week    PT Duration  8 weeks    PT Treatment/Interventions  Cryotherapy;Electrical Stimulation;Iontophoresis 4mg /ml Dexamethasone;Moist Heat;Ultrasound;Therapeutic exercise;Therapeutic activities;Neuromuscular re-education;Patient/family education;Manual techniques;Dry needling    PT Next Visit Plan  instruction of body mechanics with daily tasks; improve trunk strength to improve posture; soft tissue work to lumbar on left; modalities as needed    PT Home Exercise Plan  body mechanics    Consulted and Agree with Plan of Care  Patient       Patient will benefit from skilled therapeutic intervention in order to improve the following deficits and impairments:  Increased fascial restricitons, Pain, Decreased strength, Decreased range of motion, Decreased activity tolerance  Visit Diagnosis: Acute left-sided low back pain without sciatica - Plan: PT plan of care cert/re-cert  Other muscle spasm - Plan: PT plan of care cert/re-cert  Muscle weakness (generalized) - Plan: PT plan of care cert/re-cert  G-Codes - 36/14/43 1508    Functional Assessment Tool Used (Outpatient Only)  FOTO score is 46% limitation; goal is 31% limitation    Functional Limitation  Other PT primary    Other PT Primary Current Status (X5400)  At least 40 percent but less than 60 percent impaired, limited or restricted    Other PT Primary Goal Status (Q6761)  At  least 20 percent but less than 40 percent impaired, limited or restricted        Problem List Patient Active Problem List   Diagnosis Date Noted  . Degenerative disc disease, lumbar 08/13/2017  . H/O vitamin D deficiency 06/06/2016  . Coccydynia 06/01/2015  . Low back pain 08/19/2014  . OAB (overactive bladder) 04/16/2013  . Chest pain at rest 11/03/2012  . Tobacco abuse 08/26/2012  . DI (detrusor instability)   . STD (sexually transmitted disease)   . Osteopenia   . Hypercholesterolemia 08/20/2011  . Sinusitis   .  GERD (gastroesophageal reflux disease)     Earlie Counts, PT 10/08/17 3:34 PM   [facility] [address] [place], [place], [address] Phone: [phone]   Fax:  [phone]  Name: Sharon Robinson MRN: [id number] Date of Birth: 04/05/1946

## 2017-10-23 ENCOUNTER — Encounter: Payer: Self-pay | Admitting: Physical Therapy

## 2017-10-23 ENCOUNTER — Ambulatory Visit: Payer: Medicare Other | Attending: Nurse Practitioner | Admitting: Physical Therapy

## 2017-10-23 DIAGNOSIS — M6281 Muscle weakness (generalized): Secondary | ICD-10-CM | POA: Insufficient documentation

## 2017-10-23 DIAGNOSIS — M545 Low back pain, unspecified: Secondary | ICD-10-CM

## 2017-10-23 DIAGNOSIS — M62838 Other muscle spasm: Secondary | ICD-10-CM | POA: Diagnosis not present

## 2017-10-23 NOTE — Therapy (Addendum)
[facility] [address] [place], [place], [address] Phone: [phone]   Fax:  [phone]  Physical Therapy Treatment  Patient Details  Name: Sharon Robinson MRN: [id number] Date of Birth: January 30, 1946 Referring Provider: Dr. Caesar Chestnut   Encounter Date: 10/23/2017  PT End of Session - 10/23/17 1407    Visit Number  2    Date for PT Re-Evaluation  12/03/17    Authorization Type  [employer] Medicare; KX on 15th visit; 10th visit is g-code    PT Start Time  1400    PT Stop Time  1440    PT Time Calculation (min)  40 min    Activity Tolerance  Patient tolerated treatment well    Behavior During Therapy  [facility] for tasks assessed/performed       Past Medical History:  Diagnosis Date  . Degenerative disc disease, lumbar 08/13/2017   Xray on 08/12/17  . DI (detrusor instability)   . GERD (gastroesophageal reflux disease)   . H/O vitamin D deficiency   . Hyperlipidemia   . Osteopenia   . Sinusitis    related to smoking  . STD (sexually transmitted disease)    HSV 2    Past Surgical History:  Procedure Laterality Date  . CATARACT EXTRACTION Left    Jun 20 2015  . OTHER SURGICAL HISTORY     T&A age 38  . OTHER SURGICAL HISTORY  1989   tubal ligation  . TONSILLECTOMY    . TUBAL LIGATION      There were no vitals filed for this visit.  Subjective Assessment - 10/23/17 1405    Subjective  My back is not too bad.  I am taking the Tylenol.     How long can you sit comfortably?  depends on chair    Patient Stated Goals  how exercise correctly; reduce pain    Currently in Pain?  Yes    Pain Score  7     Pain Location  Back    Pain Orientation  Left;Lower    Pain Descriptors / Indicators  Sharp    Pain Type  Acute pain    Pain Onset  More than a month ago    Pain Frequency  Intermittent    Aggravating Factors   sitting    Pain Relieving Factors  Tylenol    Multiple Pain Sites  No                       [facility] Adult PT Treatment/Exercise - 10/23/17 0001      Therapeutic Activites    Therapeutic Activities  Lifting;ADL's;Other Therapeutic Activities    ADL's  sitting , going up and down stairs    Lifting  picking up things, lifting to bring things up the stairs      Exercises   Exercises  Lumbar      Lumbar Exercises: Aerobic   Stationary Bike  nustep seat#6, arm #10, level 2 5 min      Lumbar Exercises: Standing   Other Standing Lumbar Exercises  standing hip abduction yellow band 10x each with tactile cues on technique      Lumbar Exercises: Seated   Sit to Stand Limitations  10x with yellow band around knees with tactile cues to increase hip flexion     Other Seated Lumbar Exercises  sitting theraband  knees apart 20x yellow band  PT Education - 10/23/17 1441    Education provided  Yes    Education Details  hip strength; body mechanics with lifting and sitting, going up the stairs    Person(s) Educated  Patient    Methods  Explanation;Verbal cues;Handout;Demonstration    Comprehension  Verbalized understanding;Returned demonstration       PT Short Term Goals - 10/23/17 1446      PT SHORT TERM GOAL #1   Title  independent with her stretches for her back    Baseline  still learning    Time  4    Period  Weeks    Status  On-going      PT SHORT TERM GOAL #2   Title  understand postures to avoid due to osteoporosis and decrease strain on lumbar    Time  4    Period  Weeks    Status  On-going      PT SHORT TERM GOAL #3   Title  able to return demonstration of correct body mechanics with daily tasks to decrease strain on lumbar    Time  4    Period  Weeks    Status  On-going        PT Long Term Goals - 10/08/17 1510      PT LONG TERM GOAL #1   Title  independent with HEP and how to progress herlsef    Time  8    Period  Weeks    Status  New    Target Date  12/03/17      PT LONG TERM GOAL #2   Title   walking in [place] for 30 min. with decreased lumbar pain >/= 50%    Time  8    Period  Weeks    Status  New    Target Date  12/03/17      PT LONG TERM GOAL #3   Title  understand correct body mechanincs with  daily tasks to decresaed strain on back    Time  8    Period  Weeks    Status  New    Target Date  12/03/17      PT LONG TERM GOAL #4   Title  understand how to exercise at the gym with correct body mechanics to decrease strain on back    Time  8    Period  Weeks    Status  New    Target Date  12/03/17      PT LONG TERM GOAL #5   Title  FOTO score </= 31% limitation    Time  8    Period  Weeks    Status  New    Target Date  12/03/17            Plan - 10/23/17 1443    Clinical Impression Statement  Patient goes down stairs backward and up with posterior tilt of her pelvis.  Patient has weak hip abductors and was having difficulty performing in standing so sitting is better.  Patient back pain has not changed yet.  Patient understands how to sit correctly, lift with anrterior tilt of pelvis instead of posterior and how to go from sit to stand controlled. Patient will benfit from skilled therapy to improve core strength and posture to reduce strain on the lumbar area.     Rehab Potential  Excellent    Clinical Impairments Affecting Rehab Potential  Osteoporosis    PT Frequency  1x / week  PT Duration  8 weeks    PT Treatment/Interventions  Cryotherapy;Electrical Stimulation;Iontophoresis 36m/ml Dexamethasone;Moist Heat;Ultrasound;Therapeutic exercise;Therapeutic activities;Neuromuscular re-education;Patient/family education;Manual techniques;Dry needling    PT Next Visit Plan  instruction of body mechanics with daily tasks; improve trunk strength to improve posture; hip strength; soft tissue work to lumbar on left; modalities as needed; will come in two weeks due to expenses    PT Home Exercise Plan  progress as needed    Recommended Other Services  MD signed initial  eval    Consulted and Agree with Plan of Care  Patient       Patient will benefit from skilled therapeutic intervention in order to improve the following deficits and impairments:  Increased fascial restricitons, Pain, Decreased strength, Decreased range of motion, Decreased activity tolerance  Visit Diagnosis: Acute left-sided low back pain without sciatica  Other muscle spasm  Muscle weakness (generalized)     Problem List Patient Active Problem List   Diagnosis Date Noted  . Degenerative disc disease, lumbar 08/13/2017  . H/O vitamin D deficiency 06/06/2016  . Coccydynia 06/01/2015  . Low back pain 08/19/2014  . OAB (overactive bladder) 04/16/2013  . Chest pain at rest 11/03/2012  . Tobacco abuse 08/26/2012  . DI (detrusor instability)   . STD (sexually transmitted disease)   . Osteopenia   . Hypercholesterolemia 08/20/2011  . Sinusitis   . GERD (gastroesophageal reflux disease)     CEarlie Counts PT 10/23/17 2:47 PM   [facility] [address] [place] [address]Phone: [phone]  Fax:  [phone] Name: ADALEIGH POLLINGERMRN: [id number] of Birth: [birth year]PHYSICAL THERAPY DISCHARGE SUMMARY  Visits from Start of Care: 2  Current functional level related to goals / functional outcomes: See above. Patient has not returned since last visit on 10/23/2017.     Remaining deficits: See above.    Education / Equipment: HEP Plan: Patient agrees to discharge.  Patient goals were not met. Patient is being discharged due to not returning since the last visit.  Thank you for the referral. CEarlie Counts PT 12/04/17 7:49 AM  ?????

## 2017-10-23 NOTE — Patient Instructions (Addendum)
   Start by sitting in a chair with an elastic band wrapped around your lower thighs.  Next, move a knee upward, set it back down and then alternate to the other side. 20 times 1 time per day.   Posture - Sitting    Sit upright, head facing forward. Try using a roll to support lower back. Keep shoulders relaxed, and avoid rounded back. Keep hips level with knees. Avoid crossing legs for long periods.   Copyright  VHI. All rights reserved.  Posture - Sitting    Sit upright, head facing forward. Try using a roll to support lower back. Keep shoulders relaxed, and avoid rounded back. Keep hips level with knees. Avoid crossing legs for long periods. No sitting more than 30 - 40 minutes.    Copyright  VHI. All rights reserved.    Start in sitting with good posture. Activate core stabilizing muscles to protect lumbar spine. Bring knees together and wrap an elastic exercise band around them. Press the knees outward, activating the  hip stabilizers. Hold, then slowly return to starting position. Repeat 20 times with yellow band.   Lifting Principles  .Maintain proper posture and head alignment. .Slide object as close as possible before lifting. .Move obstacles out of the way. .Test before lifting; ask for help if too heavy. .Tighten stomach muscles without holding breath. .Use smooth movements; do not jerk. .Use legs to do the work, and pivot with feet. .Distribute the work load symmetrically and close to the center of trunk. .Push instead of pull whenever possible.  Copyright  VHI. All rights reserved.  Deep Squat    Squat and lift with both arms held against upper trunk. Tighten stomach muscles without holding breath. Use smooth movements to avoid jerking.  Copyright  VHI. All rights reserved.  [place] [address], [place], [place] [address] Phone # [phone] Fax [phone]

## 2017-10-28 DIAGNOSIS — M7542 Impingement syndrome of left shoulder: Secondary | ICD-10-CM | POA: Diagnosis not present

## 2017-10-28 DIAGNOSIS — M25512 Pain in left shoulder: Secondary | ICD-10-CM | POA: Diagnosis not present

## 2017-11-12 ENCOUNTER — Telehealth: Payer: Self-pay | Admitting: Physical Therapy

## 2017-11-12 NOTE — Telephone Encounter (Signed)
Left message to have patient call me.  She has not made appointments since 10/23/2017 and wanted to know if she is returning.  Earlie Counts, PT @1 /22/2019@ 2:37 PM

## 2017-11-25 DIAGNOSIS — M7542 Impingement syndrome of left shoulder: Secondary | ICD-10-CM | POA: Diagnosis not present

## 2017-12-02 DIAGNOSIS — Z23 Encounter for immunization: Secondary | ICD-10-CM | POA: Diagnosis not present

## 2018-01-14 ENCOUNTER — Other Ambulatory Visit: Payer: Self-pay | Admitting: Obstetrics & Gynecology

## 2018-01-14 DIAGNOSIS — L03031 Cellulitis of right toe: Secondary | ICD-10-CM | POA: Diagnosis not present

## 2018-01-14 NOTE — Telephone Encounter (Signed)
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 In 2014 he wrote in chart "Patient also has history of HSV 2 and has been on suppressive therapy of Valtrex 500 mg daily. "  It appears it has been quite awhile since she has actually had Rx.

## 2018-01-22 ENCOUNTER — Encounter: Payer: Self-pay | Admitting: Cardiology

## 2018-01-22 DIAGNOSIS — Z1231 Encounter for screening mammogram for malignant neoplasm of breast: Secondary | ICD-10-CM | POA: Diagnosis not present

## 2018-01-23 DIAGNOSIS — L918 Other hypertrophic disorders of the skin: Secondary | ICD-10-CM | POA: Diagnosis not present

## 2018-01-23 DIAGNOSIS — L738 Other specified follicular disorders: Secondary | ICD-10-CM | POA: Diagnosis not present

## 2018-01-23 DIAGNOSIS — D225 Melanocytic nevi of trunk: Secondary | ICD-10-CM | POA: Diagnosis not present

## 2018-01-23 DIAGNOSIS — L570 Actinic keratosis: Secondary | ICD-10-CM | POA: Diagnosis not present

## 2018-01-23 DIAGNOSIS — D1801 Hemangioma of skin and subcutaneous tissue: Secondary | ICD-10-CM | POA: Diagnosis not present

## 2018-02-06 ENCOUNTER — Other Ambulatory Visit: Payer: Self-pay | Admitting: Cardiology

## 2018-02-06 NOTE — Telephone Encounter (Signed)
Pt's pharmacy is requesting a refill on Atorvastatin. Would Dr. Marlou Porch like to refill this medication? Please address

## 2018-02-07 ENCOUNTER — Other Ambulatory Visit: Payer: Self-pay | Admitting: Internal Medicine

## 2018-03-21 ENCOUNTER — Other Ambulatory Visit: Payer: Self-pay | Admitting: Obstetrics & Gynecology

## 2018-04-21 ENCOUNTER — Other Ambulatory Visit: Payer: Self-pay | Admitting: Obstetrics & Gynecology

## 2018-07-10 ENCOUNTER — Telehealth: Payer: Self-pay | Admitting: *Deleted

## 2018-07-10 MED ORDER — ALENDRONATE SODIUM 70 MG PO TABS: 70.0000 mg | ORAL_TABLET | ORAL | 0 refills | Status: DC

## 2018-07-10 NOTE — Telephone Encounter (Signed)
Annual scheduled on 09/09/18, needs refill on fosamax. Rx sent.

## 2018-08-24 ENCOUNTER — Other Ambulatory Visit: Payer: Self-pay | Admitting: Obstetrics & Gynecology

## 2018-08-25 NOTE — Telephone Encounter (Signed)
Annual exam on 09/09/18

## 2018-09-01 ENCOUNTER — Ambulatory Visit (INDEPENDENT_AMBULATORY_CARE_PROVIDER_SITE_OTHER): Payer: Medicare Other | Admitting: *Deleted

## 2018-09-01 VITALS — BP 124/62 | HR 78 | Resp 17 | Ht 64.0 in | Wt 160.0 lb

## 2018-09-01 DIAGNOSIS — Z0000 Encounter for general adult medical examination without abnormal findings: Secondary | ICD-10-CM

## 2018-09-01 DIAGNOSIS — Z23 Encounter for immunization: Secondary | ICD-10-CM | POA: Diagnosis not present

## 2018-09-01 NOTE — Progress Notes (Signed)
Subjective:   Sharon Robinson is a 72 y.o. female who presents for Medicare Annual (Subsequent) preventive examination.  Review of Systems:  No ROS.  Medicare Wellness Visit. Additional risk factors are reflected in the social history.  Cardiac Risk Factors include: advanced age (>108men, >41 women) Sleep patterns: feels rested on waking, gets up 1-2 times nightly to void and sleeps 7-8 hours nightly.    Home Safety/Smoke Alarms: Feels safe in home. Smoke alarms in place.  Living environment; residence and Firearm Safety: 1-story house/ trailer, no firearms. Lives alone, no needs for DME, good support system Seat Belt Safety/Bike Helmet: Wears seat belt.     Objective:     Vitals: BP 124/62   Pulse 78   Resp 17   Ht 5\' 4"  (1.626 m)   Wt 160 lb (72.6 kg)   SpO2 99%   BMI 27.46 kg/m   Body mass index is 27.46 kg/m.  Advanced Directives 09/01/2018 10/08/2017 08/26/2017  Does Patient Have a Medical Advance Directive? No Yes Yes  Type of Advance Directive - - [place];Living will  Copy of [place] in Chart? - - No - copy requested  Would patient like information on creating a medical advance directive? No - Patient declined - -    Tobacco Social History   Tobacco Use  Smoking Status Current Every Day Smoker  . Packs/day: 0.50  . Types: Cigarettes  Smokeless Tobacco Never Used     Ready to quit: No Counseling given: No  Past Medical History:  Diagnosis Date  . Degenerative disc disease, lumbar 08/13/2017   Xray on 08/12/17  . DI (detrusor instability)   . GERD (gastroesophageal reflux disease)   . H/O vitamin D deficiency   . Hyperlipidemia   . Osteopenia   . Sinusitis    related to smoking  . STD (sexually transmitted disease)    HSV 2   Past Surgical History:  Procedure Laterality Date  . CATARACT EXTRACTION Left    Jun 20 2015  . OTHER SURGICAL HISTORY     T&A age 75  . OTHER SURGICAL HISTORY  1989   tubal  ligation  . TONSILLECTOMY    . TUBAL LIGATION     Family History  Problem Relation Age of Onset  . Heart attack Mother 15  . Cancer Father 51       colon   Social History   Socioeconomic History  . Marital status: Single    Spouse name: Not on file  . Number of children: Not on file  . Years of education: Not on file  . Highest education level: Not on file  Occupational History  . Not on file  Social Needs  . Financial resource strain: Not hard at all  . Food insecurity:    Worry: Never true    Inability: Never true  . Transportation needs:    Medical: No    Non-medical: No  Tobacco Use  . Smoking status: Current Every Day Smoker    Packs/day: 0.50    Types: Cigarettes  . Smokeless tobacco: Never Used  Substance and Sexual Activity  . Alcohol use: Yes    Alcohol/week: 0.0 standard drinks    Comment: rare  . Drug use: No  . Sexual activity: Never    Birth control/protection: Surgical, Post-menopausal  Lifestyle  . Physical activity:    Days per week: 0 days    Minutes per session: 0 min  . Stress: Not  at all  Relationships  . Social connections:    Talks on phone: More than three times a week    Gets together: More than three times a week    Attends religious service: More than 4 times per year    Active member of club or organization: Yes    Attends meetings of clubs or organizations: More than 4 times per year    Relationship status: Not on file  Other Topics Concern  . Not on file  Social History Narrative  . Not on file    Outpatient Encounter Medications as of 09/01/2018  Medication Sig  . acetaminophen (TYLENOL) 325 MG tablet Take 650 mg every 6 (six) hours as needed by mouth.  Marland Kitchen alendronate (FOSAMAX) 70 MG tablet Take 1 tablet (70 mg total) by mouth once a week. Take with a full glass of water on an empty stomach.  Marland Kitchen atorvastatin (LIPITOR) 40 MG tablet TAKE 1 TABLET ONCE DAILY.  . carbamide peroxide (DEBROX) 6.5 % otic solution Place 5 drops into  both ears 2 (two) times daily as needed.  . cetirizine (ZYRTEC ALLERGY) 10 MG tablet Take 1 tablet (10 mg total) by mouth daily.  . Cholecalciferol (VITAMIN D) 2000 units CAPS Take 2,000 Units by mouth daily.   Marland Kitchen ketoconazole (NIZORAL) 2 % cream Apply 1 application daily topically.  . naproxen sodium (ALEVE) 220 MG tablet Take 220 mg by mouth as needed.  . valACYclovir (VALTREX) 500 MG tablet TAKE 1 TABLET ONCE DAILY.  . [DISCONTINUED] meloxicam (MOBIC) 7.5 MG tablet Take 1 tablet (7.5 mg total) by mouth daily as needed for pain. With food (Patient not taking: Reported on 09/01/2018)   No facility-administered encounter medications on file as of 09/01/2018.     Activities of Daily Living In your present state of health, do you have any difficulty performing the following activities: 09/01/2018  Hearing? N  Vision? N  Difficulty concentrating or making decisions? N  Walking or climbing stairs? N  Dressing or bathing? N  Doing errands, shopping? N  Preparing Food and eating ? N  Using the Toilet? N  In the past six months, have you accidently leaked urine? N  Do you have problems with loss of bowel control? N  Managing your Medications? N  Managing your Finances? N  Housekeeping or managing your Housekeeping? N  Some recent data might be hidden    Patient Care Team: Lance Sell, NP as PCP - General (Nurse Practitioner)    Assessment:   This is a routine wellness examination for Sharon Robinson. Physical assessment deferred to PCP.   Exercise Activities and Dietary recommendations Current Exercise Habits: The patient does not participate in regular exercise at present  Diet (meal preparation, eat out, water intake, caffeinated beverages, dairy products, fruits and vegetables): in general, a "healthy" diet  , well balanced   Reviewed heart healthy and diabetic diet. Encouraged patient to increase daily water and healthy fluid intake.  Goals    . Patient Stated     Stay as  healthy and as independent as possible.    . Stay as active and as independent as possible    . Stay as healthy and as independent as possible       Fall Risk Fall Risk  09/01/2018 08/26/2017 12/25/2016  Falls in the past year? 0 No No   Depression Screen PHQ 2/9 Scores 09/01/2018 08/26/2017 12/25/2016  PHQ - 2 Score 0 0 0  PHQ- 9 Score - 0 -  Cognitive Function MMSE - Mini Mental State Exam 09/01/2018 08/26/2017  Orientation to time 5 5  Orientation to Place 5 5  Registration 3 -  Attention/ Calculation 5 -  Recall 2 -  Language- name 2 objects 2 -  Language- repeat 1 -  Language- follow 3 step command 3 -  Language- read & follow direction 1 -  Write a sentence 1 -  Copy design 1 -  Total score 29 -        Immunization History  Administered Date(s) Administered  . Pneumococcal Conjugate-13 12/25/2016  . Pneumococcal Polysaccharide-23 09/01/2018  . Tdap 05/06/2014  . Zoster 05/20/2013   Screening Tests Health Maintenance  Topic Date Due  . COLONOSCOPY  03/21/1994  . INFLUENZA VACCINE  01/30/2019 (Originally 05/22/2018)  . PAP SMEAR  06/11/2019  . MAMMOGRAM  01/23/2020  . TETANUS/TDAP  05/06/2024  . DEXA SCAN  Completed  . Hepatitis C Screening  Completed  . PNA vac Low Risk Adult  Completed      Plan:   Continue doing brain stimulating activities (puzzles, reading, adult coloring books, staying active) to keep memory sharp.   Continue to eat heart healthy diet (full of fruits, vegetables, whole grains, lean protein, water--limit salt, fat, and sugar intake) and increase physical activity as tolerated.   I have personally reviewed and noted the following in the patient's chart:   . Medical and social history . Use of alcohol, tobacco or illicit drugs  . Current medications and supplements . Functional ability and status . Nutritional status . Physical activity . Advanced directives . List of other physicians . Vitals . Screenings to include  cognitive, depression, and falls . Referrals and appointments  In addition, I have reviewed and discussed with patient certain preventive protocols, quality metrics, and best practice recommendations. A written personalized care plan for preventive services as well as general preventive health recommendations were provided to patient.     Michiel Cowboy, RN  09/01/2018

## 2018-09-01 NOTE — Progress Notes (Signed)
Medical screening examination/treatment/procedure(s) were performed by the Wellness Coach, RN. As primary care provider I was immediately available for consulation/collaboration. I agree with above documentation. Ashleigh Shambley, NP  

## 2018-09-01 NOTE — Patient Instructions (Addendum)
Continue doing brain stimulating activities (puzzles, reading, adult coloring books, staying active) to keep memory sharp.   Continue to eat heart healthy diet (full of fruits, vegetables, whole grains, lean protein, water--limit salt, fat, and sugar intake) and increase physical activity as tolerated.  [name] , Thank you for taking time to come for your Medicare Wellness Visit. I appreciate your ongoing commitment to your health goals. Please review the following plan we discussed and let me know if I can assist you in the future.   These are the goals we discussed: Goals    . Patient Stated     Stay as healthy and as independent as possible.    . Stay as active and as independent as possible    . Stay as healthy and as independent as possible       This is a list of the screening recommended for you and due dates:  Health Maintenance  Topic Date Due  . Colon Cancer Screening  03/21/1994  . Pneumonia vaccines (2 of 2 - PPSV23) 12/25/2017  . Flu Shot  01/30/2019*  . Pap Smear  06/11/2019  . Mammogram  01/23/2020  . Tetanus Vaccine  05/06/2024  . DEXA scan (bone density measurement)  Completed  .  Hepatitis C: One time screening is recommended by Center for Disease Control  (CDC) for  adults born from [birth year] through 1965.   Completed  *Topic was postponed. The date shown is not the original due date.    Health Maintenance, Female Adopting a healthy lifestyle and getting preventive care can go a long way to promote health and wellness. Talk with your health care provider about what schedule of regular examinations is right for you. This is a good chance for you to check in with your provider about disease prevention and staying healthy. In between checkups, there are plenty of things you can do on your own. Experts have done a lot of research about which lifestyle changes and preventive measures are most likely to keep you healthy. Ask your health care provider for more  information. Weight and diet Eat a healthy diet  Be sure to include plenty of vegetables, fruits, low-fat dairy products, and lean protein.  Do not eat a lot of foods high in solid fats, added sugars, or salt.  Get regular exercise. This is one of the most important things you can do for your health. ? Most adults should exercise for at least 150 minutes each week. The exercise should increase your heart rate and make you sweat (moderate-intensity exercise). ? Most adults should also do strengthening exercises at least twice a week. This is in addition to the moderate-intensity exercise.  Maintain a healthy weight  Body mass index (BMI) is a measurement that can be used to identify possible weight problems. It estimates body fat based on height and weight. Your health care provider can help determine your BMI and help you achieve or maintain a healthy weight.  For females 29 years of age and older: ? A BMI below 18.5 is considered underweight. ? A BMI of 18.5 to 24.9 is normal. ? A BMI of 25 to 29.9 is considered overweight. ? A BMI of 30 and above is considered obese.  Watch levels of cholesterol and blood lipids  You should start having your blood tested for lipids and cholesterol at 74 years of age, then have this test every 5 years.  You may need to have your cholesterol levels checked more often if: ?  Your lipid or cholesterol levels are high. ? You are older than 74 years of age. ? You are at high risk for heart disease.  Cancer screening Lung Cancer  Lung cancer screening is recommended for adults 75-28 years old who are at high risk for lung cancer because of a history of smoking.  A yearly low-dose CT scan of the lungs is recommended for people who: ? Currently smoke. ? Have quit within the past 15 years. ? Have at least a 30-pack-year history of smoking. A pack year is smoking an average of one pack of cigarettes a day for 1 year.  Yearly screening should continue  until it has been 15 years since you quit.  Yearly screening should stop if you develop a health problem that would prevent you from having lung cancer treatment.  Breast Cancer  Practice breast self-awareness. This means understanding how your breasts normally appear and feel.  It also means doing regular breast self-exams. Let your health care provider know about any changes, no matter how small.  If you are in your 20s or 30s, you should have a clinical breast exam (CBE) by a health care provider every 1-3 years as part of a regular health exam.  If you are 75 or older, have a CBE every year. Also consider having a breast X-ray (mammogram) every year.  If you have a family history of breast cancer, talk to your health care provider about genetic screening.  If you are at high risk for breast cancer, talk to your health care provider about having an MRI and a mammogram every year.  Breast cancer gene (BRCA) assessment is recommended for women who have family members with BRCA-related cancers. BRCA-related cancers include: ? Breast. ? Ovarian. ? Tubal. ? Peritoneal cancers.  Results of the assessment will determine the need for genetic counseling and BRCA1 and BRCA2 testing.  Cervical Cancer Your health care provider may recommend that you be screened regularly for cancer of the pelvic organs (ovaries, uterus, and vagina). This screening involves a pelvic examination, including checking for microscopic changes to the surface of your cervix (Pap test). You may be encouraged to have this screening done every 3 years, beginning at age 53.  For women ages 67-65, health care providers may recommend pelvic exams and Pap testing every 3 years, or they may recommend the Pap and pelvic exam, combined with testing for human papilloma virus (HPV), every 5 years. Some types of HPV increase your risk of cervical cancer. Testing for HPV may also be done on women of any age with unclear Pap test  results.  Other health care providers may not recommend any screening for nonpregnant women who are considered low risk for pelvic cancer and who do not have symptoms. Ask your health care provider if a screening pelvic exam is right for you.  If you have had past treatment for cervical cancer or a condition that could lead to cancer, you need Pap tests and screening for cancer for at least 20 years after your treatment. If Pap tests have been discontinued, your risk factors (such as having a new sexual partner) need to be reassessed to determine if screening should resume. Some women have medical problems that increase the chance of getting cervical cancer. In these cases, your health care provider may recommend more frequent screening and Pap tests.  Colorectal Cancer  This type of cancer can be detected and often prevented.  Routine colorectal cancer screening usually begins at 74 years  of age and continues through 74 years of age.  Your health care provider may recommend screening at an earlier age if you have risk factors for colon cancer.  Your health care provider may also recommend using home test kits to check for hidden blood in the stool.  A small camera at the end of a tube can be used to examine your colon directly (sigmoidoscopy or colonoscopy). This is done to check for the earliest forms of colorectal cancer.  Routine screening usually begins at age 61.  Direct examination of the colon should be repeated every 5-10 years through 74 years of age. However, you may need to be screened more often if early forms of precancerous polyps or small growths are found.  Skin Cancer  Check your skin from head to toe regularly.  Tell your health care provider about any new moles or changes in moles, especially if there is a change in a mole's shape or color.  Also tell your health care provider if you have a mole that is larger than the size of a pencil eraser.  Always use sunscreen.  Apply sunscreen liberally and repeatedly throughout the day.  Protect yourself by wearing long sleeves, pants, a wide-brimmed hat, and sunglasses whenever you are outside.  Heart disease, diabetes, and high blood pressure  High blood pressure causes heart disease and increases the risk of stroke. High blood pressure is more likely to develop in: ? People who have blood pressure in the high end of the normal range (130-139/85-89 mm Hg). ? People who are overweight or obese. ? People who are African American.  If you are 63-67 years of age, have your blood pressure checked every 3-5 years. If you are 12 years of age or older, have your blood pressure checked every year. You should have your blood pressure measured twice-once when you are at a hospital or clinic, and once when you are not at a hospital or clinic. Record the average of the two measurements. To check your blood pressure when you are not at a hospital or clinic, you can use: ? An automated blood pressure machine at a pharmacy. ? A home blood pressure monitor.  If you are between 69 years and 40 years old, ask your health care provider if you should take aspirin to prevent strokes.  Have regular diabetes screenings. This involves taking a blood sample to check your fasting blood sugar level. ? If you are at a normal weight and have a low risk for diabetes, have this test once every three years after 74 years of age. ? If you are overweight and have a high risk for diabetes, consider being tested at a younger age or more often. Preventing infection Hepatitis B  If you have a higher risk for hepatitis B, you should be screened for this virus. You are considered at high risk for hepatitis B if: ? You were born in a country where hepatitis B is common. Ask your health care provider which countries are considered high risk. ? Your parents were born in a high-risk country, and you have not been immunized against hepatitis B (hepatitis B  vaccine). ? You have HIV or AIDS. ? You use needles to inject street drugs. ? You live with someone who has hepatitis B. ? You have had sex with someone who has hepatitis B. ? You get hemodialysis treatment. ? You take certain medicines for conditions, including cancer, organ transplantation, and autoimmune conditions.  Hepatitis C  Blood  testing is recommended for: ? Everyone born from [birth year] through 1967. ? Anyone with known risk factors for hepatitis C.  Sexually transmitted infections (STIs)  You should be screened for sexually transmitted infections (STIs) including gonorrhea and chlamydia if: ? You are sexually active and are younger than 74 years of age. ? You are older than 74 years of age and your health care provider tells you that you are at risk for this type of infection. ? Your sexual activity has changed since you were last screened and you are at an increased risk for chlamydia or gonorrhea. Ask your health care provider if you are at risk.  If you do not have HIV, but are at risk, it may be recommended that you take a prescription medicine daily to prevent HIV infection. This is called pre-exposure prophylaxis (PrEP). You are considered at risk if: ? You are sexually active and do not regularly use condoms or know the HIV status of your partner(s). ? You take drugs by injection. ? You are sexually active with a partner who has HIV.  Talk with your health care provider about whether you are at high risk of being infected with HIV. If you choose to begin PrEP, you should first be tested for HIV. You should then be tested every 3 months for as long as you are taking PrEP. Pregnancy  If you are premenopausal and you may become pregnant, ask your health care provider about preconception counseling.  If you may become pregnant, take 400 to 800 micrograms (mcg) of folic acid every day.  If you want to prevent pregnancy, talk to your health care provider about birth control  (contraception). Osteoporosis and menopause  Osteoporosis is a disease in which the bones lose minerals and strength with aging. This can result in serious bone fractures. Your risk for osteoporosis can be identified using a bone density scan.  If you are 28 years of age or older, or if you are at risk for osteoporosis and fractures, ask your health care provider if you should be screened.  Ask your health care provider whether you should take a calcium or vitamin D supplement to lower your risk for osteoporosis.  Menopause may have certain physical symptoms and risks.  Hormone replacement therapy may reduce some of these symptoms and risks. Talk to your health care provider about whether hormone replacement therapy is right for you. Follow these instructions at home:  Schedule regular health, dental, and eye exams.  Stay current with your immunizations.  Do not use any tobacco products including cigarettes, chewing tobacco, or electronic cigarettes.  If you are pregnant, do not drink alcohol.  If you are breastfeeding, limit how much and how often you drink alcohol.  Limit alcohol intake to no more than 1 drink per day for nonpregnant women. One drink equals 12 ounces of beer, 5 ounces of , or 1 ounces of hard liquor.  Do not use street drugs.  Do not share needles.  Ask your health care provider for help if you need support or information about quitting drugs.  Tell your health care provider if you often feel depressed.  Tell your health care provider if you have ever been abused or do not feel safe at home. This information is not intended to replace advice given to you by your health care provider. Make sure you discuss any questions you have with your health care provider. Document Released: 04/23/2011 Document Revised: 03/15/2016 Document Reviewed: 07/12/2015 Elsevier Interactive Patient Education  2018 [place].

## 2018-09-09 ENCOUNTER — Encounter: Payer: Self-pay | Admitting: Obstetrics & Gynecology

## 2018-09-09 ENCOUNTER — Ambulatory Visit: Payer: Medicare Other | Admitting: Obstetrics & Gynecology

## 2018-09-09 VITALS — BP 140/84 | Ht 64.0 in | Wt 157.0 lb

## 2018-09-09 DIAGNOSIS — Z780 Asymptomatic menopausal state: Secondary | ICD-10-CM | POA: Diagnosis not present

## 2018-09-09 DIAGNOSIS — M8589 Other specified disorders of bone density and structure, multiple sites: Secondary | ICD-10-CM

## 2018-09-09 DIAGNOSIS — Z01419 Encounter for gynecological examination (general) (routine) without abnormal findings: Secondary | ICD-10-CM | POA: Diagnosis not present

## 2018-09-09 DIAGNOSIS — M85852 Other specified disorders of bone density and structure, left thigh: Secondary | ICD-10-CM

## 2018-09-09 DIAGNOSIS — M85851 Other specified disorders of bone density and structure, right thigh: Secondary | ICD-10-CM

## 2018-09-09 MED ORDER — ALENDRONATE SODIUM 70 MG PO TABS: 70.0000 mg | ORAL_TABLET | ORAL | 4 refills | Status: DC

## 2018-09-09 NOTE — Progress Notes (Signed)
SEKAI NAYAK Jul 24, 1946 [id number]   History:    72 y.o. G0 Single  RP:  Established patient presenting for annual gyn exam   HPI: Postmenopausal, well on no HRT.  No PMB.  No pelvic pain.  Abstinent.  Urine normal.  Recent episode of Gastro-enteritis/food poisoning, now recovered.  Breasts normal. BMI 26.95.  Health labs with Fam MD.  Past medical history,surgical history, family history and social history were all reviewed and documented in the EPIC chart.  Gynecologic History No LMP recorded. Patient is postmenopausal. Contraception: abstinence and post menopausal status Last Pap: 05/2017. Results were: Negative Last mammogram: 01/2018. Results were: Negative Bone Density: 07/2016 Osteopenia on Fosamax Colonoscopy: 2009.  Cologuard 2018-2019 negative per patient  Obstetric History OB History  Gravida Para Term Preterm AB Living  0            SAB TAB Ectopic Multiple Live Births                ROS: A ROS was performed and pertinent positives and negatives are included in the history.  GENERAL: No fevers or chills. HEENT: No change in vision, no earache, sore throat or sinus congestion. NECK: No pain or stiffness. CARDIOVASCULAR: No chest pain or pressure. No palpitations. PULMONARY: No shortness of breath, cough or wheeze. GASTROINTESTINAL: No abdominal pain, nausea, vomiting or diarrhea, melena or bright red blood per rectum. GENITOURINARY: No urinary frequency, urgency, hesitancy or dysuria. MUSCULOSKELETAL: No joint or muscle pain, no back pain, no recent trauma. DERMATOLOGIC: No rash, no itching, no lesions. ENDOCRINE: No polyuria, polydipsia, no heat or cold intolerance. No recent change in weight. HEMATOLOGICAL: No anemia or easy bruising or bleeding. NEUROLOGIC: No headache, seizures, numbness, tingling or weakness. PSYCHIATRIC: No depression, no loss of interest in normal activity or change in sleep pattern.     Exam:   BP 140/84   Ht 5\' 4"  (1.626 m)   Wt 157 lb  (71.2 kg)   BMI 26.95 kg/m   Body mass index is 26.95 kg/m.  General appearance : Well developed well nourished female. No acute distress HEENT: Eyes: no retinal hemorrhage or exudates,  Neck supple, trachea midline, no carotid bruits, no thyroidmegaly Lungs: Clear to auscultation, no rhonchi or wheezes, or rib retractions  Heart: Regular rate and rhythm, no murmurs or gallops Breast:Examined in sitting and supine position were symmetrical in appearance, no palpable masses or tenderness,  no skin retraction, no nipple inversion, no nipple discharge, no skin discoloration, no axillary or supraclavicular lymphadenopathy Abdomen: no palpable masses or tenderness, no rebound or guarding Extremities: no edema or skin discoloration or tenderness  Pelvic: Vulva: Normal             Vagina: No gross lesions or discharge  Cervix: No gross lesions or discharge  Uterus  AV, normal size, shape and consistency, non-tender and mobile  Adnexa  Without masses or tenderness  Anus: Normal   Assessment/Plan:  72 y.o. female for annual exam   1. Well female exam with routine gynecological exam Normal gynecologic exam and menopause.  Pap test negative in 2018, no indication to repeat at this time.  Breast exam normal.  Screening mammogram in April 2019 was negative.  Cologuard done in 2018/19.  Health labs with family physician.  2. Postmenopausal Well on no hormone replacement therapy.  No postmenopausal bleeding.  3. Osteopenia of both hips Osteopenia on bone density October 2017 on Fosamax.  Will continue on Fosamax at this time.  Prescription  sent to pharmacy.  Will repeat bone density here now.  Vitamin D supplements, calcium intake of 1.5 g/day and regular weightbearing physical activity recommended. - DG Bone Density; Future  Other orders - alendronate (FOSAMAX) 70 MG tablet; Take 1 tablet (70 mg total) by mouth once a week. Take with a full glass of water on an empty stomach.  Princess Bruins MD, 12:01 PM 09/09/2018

## 2018-09-15 ENCOUNTER — Encounter: Payer: Self-pay | Admitting: Obstetrics & Gynecology

## 2018-09-15 NOTE — Patient Instructions (Addendum)
1. Well female exam with routine gynecological exam Normal gynecologic exam and menopause.  Pap test negative in 2018, no indication to repeat at this time.  Breast exam normal.  Screening mammogram in April 2019 was negative.  Cologuard done in 2018/19.  Health labs with family physician.  2. Postmenopausal Well on no hormone replacement therapy.  No postmenopausal bleeding.  3. Osteopenia of both hips Osteopenia on bone density October 2017 on Fosamax.  Will continue on Fosamax at this time.  Prescription sent to pharmacy.  Will repeat bone density here now.  Vitamin D supplements, calcium intake of 1.5 g/day and regular weightbearing physical activity recommended. - DG Bone Density; Future  Other orders - alendronate (FOSAMAX) 70 MG tablet; Take 1 tablet (70 mg total) by mouth once a week. Take with a full glass of water on an empty stomach.  Sharon Robinson, it was a pleasure seeing you today!  I will inform you of your bone density results when available.

## 2018-09-23 ENCOUNTER — Other Ambulatory Visit: Payer: Self-pay | Admitting: Obstetrics & Gynecology

## 2018-10-02 ENCOUNTER — Ambulatory Visit (INDEPENDENT_AMBULATORY_CARE_PROVIDER_SITE_OTHER): Payer: Medicare Other

## 2018-10-02 DIAGNOSIS — Z780 Asymptomatic menopausal state: Secondary | ICD-10-CM | POA: Diagnosis not present

## 2018-10-02 DIAGNOSIS — M8589 Other specified disorders of bone density and structure, multiple sites: Secondary | ICD-10-CM | POA: Diagnosis not present

## 2018-10-02 DIAGNOSIS — M85851 Other specified disorders of bone density and structure, right thigh: Secondary | ICD-10-CM

## 2018-10-02 DIAGNOSIS — M85852 Other specified disorders of bone density and structure, left thigh: Principal | ICD-10-CM

## 2018-10-06 ENCOUNTER — Other Ambulatory Visit: Payer: Self-pay | Admitting: Obstetrics & Gynecology

## 2018-10-06 DIAGNOSIS — M8589 Other specified disorders of bone density and structure, multiple sites: Secondary | ICD-10-CM

## 2018-10-06 DIAGNOSIS — Z780 Asymptomatic menopausal state: Secondary | ICD-10-CM

## 2018-10-28 DIAGNOSIS — H35371 Puckering of macula, right eye: Secondary | ICD-10-CM | POA: Diagnosis not present

## 2018-10-28 DIAGNOSIS — H52223 Regular astigmatism, bilateral: Secondary | ICD-10-CM | POA: Diagnosis not present

## 2018-11-22 HISTORY — PX: SHOULDER INJECTION: SHX5048

## 2018-12-23 ENCOUNTER — Other Ambulatory Visit: Payer: Self-pay | Admitting: Obstetrics & Gynecology

## 2019-01-07 DIAGNOSIS — M7502 Adhesive capsulitis of left shoulder: Secondary | ICD-10-CM | POA: Diagnosis not present

## 2019-01-09 ENCOUNTER — Encounter: Payer: Self-pay | Admitting: Family

## 2019-01-09 ENCOUNTER — Other Ambulatory Visit: Payer: Self-pay

## 2019-01-09 ENCOUNTER — Ambulatory Visit (INDEPENDENT_AMBULATORY_CARE_PROVIDER_SITE_OTHER): Payer: Medicare Other | Admitting: Family

## 2019-01-09 ENCOUNTER — Other Ambulatory Visit (INDEPENDENT_AMBULATORY_CARE_PROVIDER_SITE_OTHER): Payer: Medicare Other

## 2019-01-09 ENCOUNTER — Other Ambulatory Visit: Payer: Self-pay | Admitting: Family

## 2019-01-09 VITALS — BP 124/76 | HR 68 | Temp 97.6°F | Ht 64.0 in | Wt 158.1 lb

## 2019-01-09 DIAGNOSIS — D72829 Elevated white blood cell count, unspecified: Secondary | ICD-10-CM

## 2019-01-09 DIAGNOSIS — E785 Hyperlipidemia, unspecified: Secondary | ICD-10-CM

## 2019-01-09 DIAGNOSIS — E559 Vitamin D deficiency, unspecified: Secondary | ICD-10-CM

## 2019-01-09 LAB — LIPID PANEL
Cholesterol: 134 mg/dL (ref 0–200)
HDL: 54.7 mg/dL (ref >39.00)
LDL Cholesterol: 62 mg/dL (ref 0–99)
NonHDL: 79.65
Total CHOL/HDL Ratio: 2
Triglycerides: 88 mg/dL (ref 0.0–149.0)
VLDL: 17.6 mg/dL (ref 0.0–40.0)

## 2019-01-09 LAB — CBC WITH DIFFERENTIAL/PLATELET
Basophils Absolute: 0.1 10*3/uL (ref 0.0–0.1)
Basophils Relative: 0.7 % (ref 0.0–3.0)
Eosinophils Absolute: 0 10*3/uL (ref 0.0–0.7)
Eosinophils Relative: 0.1 % (ref 0.0–5.0)
HCT: 37 % (ref 36.0–46.0)
Hemoglobin: 12.6 g/dL (ref 12.0–15.0)
Lymphocytes Relative: 17.7 % (ref 12.0–46.0)
Lymphs Abs: 2.4 10*3/uL (ref 0.7–4.0)
MCHC: 34.1 g/dL (ref 30.0–36.0)
MCV: 104.5 fl — ABNORMAL HIGH (ref 78.0–100.0)
Monocytes Absolute: 0.9 10*3/uL (ref 0.1–1.0)
Monocytes Relative: 6.9 % (ref 3.0–12.0)
Neutro Abs: 10.2 10*3/uL — ABNORMAL HIGH (ref 1.4–7.7)
Neutrophils Relative %: 74.6 % (ref 43.0–77.0)
Platelets: 274 10*3/uL (ref 150.0–400.0)
RBC: 3.54 Mil/uL — ABNORMAL LOW (ref 3.87–5.11)
RDW: 13.8 % (ref 11.5–15.5)
WBC: 13.7 10*3/uL — ABNORMAL HIGH (ref 4.0–10.5)

## 2019-01-09 LAB — COMPREHENSIVE METABOLIC PANEL
ALK PHOS: 60 U/L (ref 39–117)
ALT: 16 U/L (ref 0–35)
AST: 15 U/L (ref 0–37)
Albumin: 4.1 g/dL (ref 3.5–5.2)
BILIRUBIN TOTAL: 0.4 mg/dL (ref 0.2–1.2)
BUN: 28 mg/dL — ABNORMAL HIGH (ref 6–23)
CO2: 29 mEq/L (ref 19–32)
Calcium: 9.6 mg/dL (ref 8.4–10.5)
Chloride: 105 mEq/L (ref 96–112)
Creatinine, Ser: 0.84 mg/dL (ref 0.40–1.20)
GFR: 66.13 mL/min (ref >60.00)
Glucose, Bld: 94 mg/dL (ref 70–99)
Potassium: 4.6 mEq/L (ref 3.5–5.1)
Sodium: 140 mEq/L (ref 135–145)
Total Protein: 6.8 g/dL (ref 6.0–8.3)

## 2019-01-09 LAB — VITAMIN D 25 HYDROXY (VIT D DEFICIENCY, FRACTURES): VITD: 54.15 ng/mL (ref 30.00–100.00)

## 2019-01-09 MED ORDER — ATORVASTATIN CALCIUM 40 MG PO TABS: 40.0000 mg | ORAL_TABLET | Freq: Every day | ORAL | 3 refills | Status: DC

## 2019-01-09 NOTE — Patient Instructions (Signed)
Please get our office a copy of your Cologuard results; You will need to go to the pharmacy to get your Shingrix;

## 2019-01-09 NOTE — Progress Notes (Signed)
Sharon Robinson is a 73 y.o. female with the following history as recorded in EpicCare:  Patient Active Problem List   Diagnosis Date Noted  . Degenerative disc disease, lumbar 08/13/2017  . H/O vitamin D deficiency 06/06/2016  . Coccydynia 06/01/2015  . Low back pain 08/19/2014  . OAB (overactive bladder) 04/16/2013  . Chest pain at rest 11/03/2012  . Tobacco abuse 08/26/2012  . DI (detrusor instability)   . STD (sexually transmitted disease)   . Osteopenia   . Hypercholesterolemia 08/20/2011  . Sinusitis   . GERD (gastroesophageal reflux disease)     Current Outpatient Medications  Medication Sig Dispense Refill  . alendronate (FOSAMAX) 70 MG tablet Take 1 tablet (70 mg total) by mouth once a week. Take with a full glass of water on an empty stomach. 12 tablet 4  . atorvastatin (LIPITOR) 40 MG tablet TAKE 1 TABLET ONCE DAILY. 90 tablet 3  . cetirizine (ZYRTEC ALLERGY) 10 MG tablet Take 1 tablet (10 mg total) by mouth daily. 30 tablet 30  . Cholecalciferol (VITAMIN D) 2000 units CAPS Take 2,000 Units by mouth daily.     Marland Kitchen ibuprofen (ADVIL,MOTRIN) 800 MG tablet ibuprofen 800 mg tablet  as needed    . valACYclovir (VALTREX) 500 MG tablet TAKE 1 TABLET ONCE DAILY. 30 tablet 8  . ketoconazole (NIZORAL) 2 % cream Apply 1 application daily topically.    . naproxen sodium (ALEVE) 220 MG tablet Take 220 mg by mouth as needed.     No current facility-administered medications for this visit.     Allergies: Patient has no known allergies.  Past Medical History:  Diagnosis Date  . Degenerative disc disease, lumbar 08/13/2017   Xray on 08/12/17  . DI (detrusor instability)   . GERD (gastroesophageal reflux disease)   . H/O vitamin D deficiency   . Hyperlipidemia   . Osteopenia   . Sinusitis    related to smoking  . STD (sexually transmitted disease)    HSV 2    Past Surgical History:  Procedure Laterality Date  . CATARACT EXTRACTION Left    Jun 20 2015  . OTHER SURGICAL HISTORY      T&A age 73  . OTHER SURGICAL HISTORY  1989   tubal ligation  . TONSILLECTOMY    . TUBAL LIGATION      Family History  Problem Relation Age of Onset  . Heart attack Mother 72  . Cancer Father 73       colon    Social History   Tobacco Use  . Smoking status: Current Every Day Smoker    Packs/day: 0.50    Types: Cigarettes  . Smokeless tobacco: Never Used  Substance Use Topics  . Alcohol use: Yes    Alcohol/week: 0.0 standard drinks    Comment: rare    Subjective:  Patient presents to transfer care from provider who is leaving our clinic; in baseline state of health; Sees GYN and dermatology and orthopedics regularly;  Scheduled for AWV in November 2020;  Planning to get her Shingrix through her pharmacy; does not typically take flu shots; Up to date on dental and eye exam;  Daily smoker- "puffs" not inhale;    Objective:  Vitals:   01/09/19 1117  BP: 124/76  Pulse: 68  Temp: 97.6 F (36.4 C)  TempSrc: Oral  SpO2: 99%  Weight: 158 lb 1.3 oz (71.7 kg)  Height: _0  (1.626 m)    General: Well developed, well nourished, in no acute  distress  Skin : Warm and dry.  Head: Normocephalic and atraumatic  Lungs: Respirations unlabored; clear to auscultation bilaterally without wheeze, rales, rhonchi  CVS exam: normal rate and regular rhythm.  Neurologic: Alert and oriented; speech intact; face symmetrical; moves all extremities well; CNII-XII intact without focal deficit   Assessment:  1. Hyperlipidemia, unspecified hyperlipidemia type   2. Vitamin D deficiency     Plan:  1. Check CBC, CMP, lipid panel today; 2. Check Vitamin D level;   Patient is asked to get copies of Cologuard; keep planned follow-up for AWV;   No follow-ups on file.  Orders Placed This Encounter  Procedures  . CBC w/Diff    Standing Status:   Future    Standing Expiration Date:   01/09/2020  . Comp Met (CMET)    Standing Status:   Future    Standing Expiration Date:   01/09/2020  .  Lipid panel    Standing Status:   Future    Standing Expiration Date:   01/09/2020  . Vitamin D (25 hydroxy)    Standing Status:   Future    Standing Expiration Date:   01/09/2020    Requested Prescriptions    No prescriptions requested or ordered in this encounter

## 2019-01-19 ENCOUNTER — Other Ambulatory Visit (INDEPENDENT_AMBULATORY_CARE_PROVIDER_SITE_OTHER): Payer: Medicare Other

## 2019-01-19 DIAGNOSIS — D72829 Elevated white blood cell count, unspecified: Secondary | ICD-10-CM

## 2019-01-19 LAB — CBC WITH DIFFERENTIAL/PLATELET
Basophils Absolute: 0.1 10*3/uL (ref 0.0–0.1)
Basophils Relative: 0.8 % (ref 0.0–3.0)
Eosinophils Absolute: 0 10*3/uL (ref 0.0–0.7)
Eosinophils Relative: 0.5 % (ref 0.0–5.0)
HCT: 39.7 % (ref 36.0–46.0)
Hemoglobin: 13.6 g/dL (ref 12.0–15.0)
Lymphocytes Relative: 24.8 % (ref 12.0–46.0)
Lymphs Abs: 2.7 10*3/uL (ref 0.7–4.0)
MCHC: 34.1 g/dL (ref 30.0–36.0)
MCV: 104.3 fl — ABNORMAL HIGH (ref 78.0–100.0)
MONO ABS: 0.8 10*3/uL (ref 0.1–1.0)
Monocytes Relative: 7.7 % (ref 3.0–12.0)
NEUTROS PCT: 66.2 % (ref 43.0–77.0)
Neutro Abs: 7.1 10*3/uL (ref 1.4–7.7)
Platelets: 292 10*3/uL (ref 150.0–400.0)
RBC: 3.81 Mil/uL — ABNORMAL LOW (ref 3.87–5.11)
RDW: 13.6 % (ref 11.5–15.5)
WBC: 10.7 10*3/uL — ABNORMAL HIGH (ref 4.0–10.5)

## 2019-07-03 DIAGNOSIS — L304 Erythema intertrigo: Secondary | ICD-10-CM | POA: Diagnosis not present

## 2019-07-03 DIAGNOSIS — D1801 Hemangioma of skin and subcutaneous tissue: Secondary | ICD-10-CM | POA: Diagnosis not present

## 2019-07-03 DIAGNOSIS — L821 Other seborrheic keratosis: Secondary | ICD-10-CM | POA: Diagnosis not present

## 2019-07-03 DIAGNOSIS — L570 Actinic keratosis: Secondary | ICD-10-CM | POA: Diagnosis not present

## 2019-07-03 DIAGNOSIS — L814 Other melanin hyperpigmentation: Secondary | ICD-10-CM | POA: Diagnosis not present

## 2019-07-30 DIAGNOSIS — Z1231 Encounter for screening mammogram for malignant neoplasm of breast: Secondary | ICD-10-CM | POA: Diagnosis not present

## 2019-09-02 NOTE — Progress Notes (Addendum)
Subjective:   Sharon Robinson is a 73 y.o. female who presents for Medicare Annual (Subsequent) preventive examination.  Review of Systems:     Sleep patterns: feels rested on waking, gets up 1 times nightly to void and sleeps 6-7 hours nightly.    Home Safety/Smoke Alarms: Feels safe in home. Smoke alarms in place.  Living environment; residence and Firearm Safety: 1-story house/ trailer. Lives alone, no needs for DME, good support system Seat Belt Safety/Bike Helmet: Wears seat belt.      Objective:     Vitals: There were no vitals taken for this visit.  There is no height or weight on file to calculate BMI.  Advanced Directives 09/01/2018 10/08/2017 08/26/2017  Does Patient Have a Medical Advance Directive? No Yes Yes  Type of Advance Directive - - [place];Living will  Copy of [place] in Chart? - - No - copy requested  Would patient like information on creating a medical advance directive? No - Patient declined - -    Tobacco Social History   Tobacco Use  Smoking Status Current Every Day Smoker  . Packs/day: 0.50  . Types: Cigarettes  Smokeless Tobacco Never Used     Ready to quit: Not Answered Counseling given: Not Answered  Past Medical History:  Diagnosis Date  . Degenerative disc disease, lumbar 08/13/2017   Xray on 08/12/17  . DI (detrusor instability)   . GERD (gastroesophageal reflux disease)   . H/O vitamin D deficiency   . Hyperlipidemia   . Osteopenia   . Sinusitis    related to smoking  . STD (sexually transmitted disease)    HSV 2   Past Surgical History:  Procedure Laterality Date  . CATARACT EXTRACTION Left    Jun 20 2015  . OTHER SURGICAL HISTORY     T&A age 35  . OTHER SURGICAL HISTORY  1989   tubal ligation  . TONSILLECTOMY    . TUBAL LIGATION     Family History  Problem Relation Age of Onset  . Heart attack Mother 64  . Cancer Father 41       colon   Social History   Socioeconomic  History  . Marital status: Single    Spouse name: Not on file  . Number of children: Not on file  . Years of education: Not on file  . Highest education level: Not on file  Occupational History  . Not on file  Social Needs  . Financial resource strain: Not hard at all  . Food insecurity    Worry: Never true    Inability: Never true  . Transportation needs    Medical: No    Non-medical: No  Tobacco Use  . Smoking status: Current Every Day Smoker    Packs/day: 0.50    Types: Cigarettes  . Smokeless tobacco: Never Used  Substance and Sexual Activity  . Alcohol use: Yes    Alcohol/week: 0.0 standard drinks    Comment: rare  . Drug use: No  . Sexual activity: Never    Birth control/protection: Surgical, Post-menopausal  Lifestyle  . Physical activity    Days per week: 0 days    Minutes per session: 0 min  . Stress: Not at all  Relationships  . Social connections    Talks on phone: More than three times a week    Gets together: More than three times a week    Attends religious service: More than 4 times per  year    Active member of club or organization: Yes    Attends meetings of clubs or organizations: More than 4 times per year    Relationship status: Not on file  Other Topics Concern  . Not on file  Social History Narrative  . Not on file    Outpatient Encounter Medications as of 09/03/2019  Medication Sig  . alendronate (FOSAMAX) 70 MG tablet Take 1 tablet (70 mg total) by mouth once a week. Take with a full glass of water on an empty stomach.  Marland Kitchen atorvastatin (LIPITOR) 40 MG tablet Take 1 tablet (40 mg total) by mouth daily.  . cetirizine (ZYRTEC ALLERGY) 10 MG tablet Take 1 tablet (10 mg total) by mouth daily.  . Cholecalciferol (VITAMIN D) 2000 units CAPS Take 2,000 Units by mouth daily.   Marland Kitchen ibuprofen (ADVIL,MOTRIN) 800 MG tablet ibuprofen 800 mg tablet  as needed  . ketoconazole (NIZORAL) 2 % cream Apply 1 application daily topically.  . naproxen sodium  (ALEVE) 220 MG tablet Take 220 mg by mouth as needed.  . valACYclovir (VALTREX) 500 MG tablet TAKE 1 TABLET ONCE DAILY.   No facility-administered encounter medications on file as of 09/03/2019.     Activities of Daily Living No flowsheet data found.  Patient Care Team: Marrian Salvage, [place] as PCP - General (Internal Medicine)    Assessment:   This is a routine wellness examination for [name]. Physical assessment deferred to PCP.  Exercise Activities and Dietary recommendations   Diet (meal preparation, eat out, water intake, caffeinated beverages, dairy products, fruits and vegetables): in general, a "healthy" diet  , well balanced   Reviewed heart healthy diet. Encouraged patient to increase daily water and healthy fluid intake.  Goals    . Patient Stated     Stay as healthy and as independent as possible.    . Stay as active and as independent as possible    . Stay as healthy and as independent as possible       Fall Risk Fall Risk  09/01/2018 08/26/2017 12/25/2016  Falls in the past year? 0 No No    Depression Screen PHQ 2/9 Scores 01/09/2019 09/01/2018 08/26/2017 12/25/2016  PHQ - 2 Score 0 0 0 0  PHQ- 9 Score - - 0 -     Cognitive Function MMSE - Mini Mental State Exam 09/01/2018 08/26/2017  Orientation to time 5 5  Orientation to Place 5 5  Registration 3 -  Attention/ Calculation 5 -  Recall 2 -  Language- name 2 objects 2 -  Language- repeat 1 -  Language- follow 3 step command 3 -  Language- read & follow direction 1 -  Write a sentence 1 -  Copy design 1 -  Total score 29 -       Ad8 score reviewed for issues:  Issues making decisions: no  Less interest in hobbies / activities: no  Repeats questions, stories (family complaining): no  Trouble using ordinary gadgets (microwave, computer, phone):no  Forgets the month or year: no  Mismanaging finances: no  Remembering appts: no  Daily problems with thinking and/or memory: no Ad8 score  is= 0  Immunization History  Administered Date(s) Administered  . Hepatitis A, Adult 12/02/2017  . Pneumococcal Conjugate-13 12/25/2016  . Pneumococcal Polysaccharide-23 08/22/2017, 09/01/2018  . Tdap 05/06/2014  . Zoster 05/20/2013    Screening Tests Health Maintenance  Topic Date Due  . COLONOSCOPY  03/22/2018  . INFLUENZA VACCINE  05/23/2019  .  PAP SMEAR-Modifier  06/11/2019  . TETANUS/TDAP  05/06/2024  . DEXA SCAN  Completed  . Hepatitis C Screening  Completed  . PNA vac Low Risk Adult  Completed       Plan:     Reviewed health maintenance screenings with patient today and relevant education, vaccines, and/or referrals were provided.   I have personally reviewed and noted the following in the patient's chart:   . Medical and social history . Use of alcohol, tobacco or illicit drugs  . Current medications and supplements . Functional ability and status . Nutritional status . Physical activity . Advanced directives . List of other physicians . Vitals . Screenings to include cognitive, depression, and falls . Referrals and appointments  In addition, I have reviewed and discussed with patient certain preventive protocols, quality metrics, and best practice recommendations. A written personalized care plan for preventive services as well as general preventive health recommendations were provided to patient.     Michiel Cowboy, RN  09/02/2019    Medical screening examination/treatment/procedure(s) were performed by non-physician practitioner and as supervising physician I was immediately available for consultation/collaboration. I agree with above. Cathlean Cower, MD

## 2019-09-03 ENCOUNTER — Ambulatory Visit (INDEPENDENT_AMBULATORY_CARE_PROVIDER_SITE_OTHER): Payer: Medicare Other | Admitting: *Deleted

## 2019-09-03 ENCOUNTER — Other Ambulatory Visit: Payer: Self-pay

## 2019-09-03 VITALS — BP 142/64 | HR 70 | Temp 98.0°F | Resp 17 | Ht 64.0 in | Wt 154.0 lb

## 2019-09-03 DIAGNOSIS — Z0000 Encounter for general adult medical examination without abnormal findings: Secondary | ICD-10-CM

## 2019-09-03 DIAGNOSIS — Z23 Encounter for immunization: Secondary | ICD-10-CM

## 2019-09-03 NOTE — Patient Instructions (Signed)
Continue doing brain stimulating activities (puzzles, reading, adult coloring books, staying active) to keep memory sharp.   Continue to eat heart healthy diet (full of fruits, vegetables, whole grains, lean protein, water--limit salt, fat, and sugar intake) and increase physical activity as tolerated.   [name] , Thank you for taking time to come for your Medicare Wellness Visit. I appreciate your ongoing commitment to your health goals. Please review the following plan we discussed and let me know if I can assist you in the future.   These are the goals we discussed: Goals    . Patient Stated     Stay as healthy and as independent as possible.    . Stay as active and as independent as possible    . Stay as healthy and as independent as possible       This is a list of the screening recommended for you and due dates:  Health Maintenance  Topic Date Due  . Colon Cancer Screening  03/22/2018  . Flu Shot  05/23/2019  . Pap Smear  06/11/2019  . Tetanus Vaccine  05/06/2024  . DEXA scan (bone density measurement)  Completed  .  Hepatitis C: One time screening is recommended by Center for Disease Control  (CDC) for  adults born from [birth year] through 1967.   Completed  . Pneumonia vaccines  Completed    Preventive Care 89 Years and Older, Female Preventive care refers to lifestyle choices and visits with your health care provider that can promote health and wellness. This includes:  A yearly physical exam. This is also called an annual well check.  Regular dental and eye exams.  Immunizations.  Screening for certain conditions.  Healthy lifestyle choices, such as diet and exercise. What can I expect for my preventive care visit? Physical exam Your health care provider will check:  Height and weight. These may be used to calculate body mass index (BMI), which is a measurement that tells if you are at a healthy weight.  Heart rate and blood pressure.  Your skin for abnormal  spots. Counseling Your health care provider may ask you questions about:  Alcohol, tobacco, and drug use.  Emotional well-being.  Home and relationship well-being.  Sexual activity.  Eating habits.  History of falls.  Memory and ability to understand (cognition).  Work and work Statistician.  Pregnancy and menstrual history. What immunizations do I need?  Influenza (flu) vaccine  This is recommended every year. Tetanus, diphtheria, and pertussis (Tdap) vaccine  You may need a Td booster every 10 years. Varicella (chickenpox) vaccine  You may need this vaccine if you have not already been vaccinated. Zoster (shingles) vaccine  You may need this after age 40. Pneumococcal conjugate (PCV13) vaccine  One dose is recommended after age 65. Pneumococcal polysaccharide (PPSV23) vaccine  One dose is recommended after age 13. Measles, mumps, and rubella (MMR) vaccine  You may need at least one dose of MMR if you were born in 1957 or later. You may also need a second dose. Meningococcal conjugate (MenACWY) vaccine  You may need this if you have certain conditions. Hepatitis A vaccine  You may need this if you have certain conditions or if you travel or work in places where you may be exposed to hepatitis A. Hepatitis B vaccine  You may need this if you have certain conditions or if you travel or work in places where you may be exposed to hepatitis B. Haemophilus influenzae type b (Hib) vaccine  You may need this if you have certain conditions. You may receive vaccines as individual doses or as more than one vaccine together in one shot (combination vaccines). Talk with your health care provider about the risks and benefits of combination vaccines. What tests do I need? Blood tests  Lipid and cholesterol levels. These may be checked every 5 years, or more frequently depending on your overall health.  Hepatitis C test.  Hepatitis B test. Screening  Lung cancer  screening. You may have this screening every year starting at age 37 if you have a 30-pack-year history of smoking and currently smoke or have quit within the past 15 years.  Colorectal cancer screening. All adults should have this screening starting at age 70 and continuing until age 72. Your health care provider may recommend screening at age 28 if you are at increased risk. You will have tests every 1-10 years, depending on your results and the type of screening test.  Diabetes screening. This is done by checking your blood sugar (glucose) after you have not eaten for a while (fasting). You may have this done every 1-3 years.  Mammogram. This may be done every 1-2 years. Talk with your health care provider about how often you should have regular mammograms.  BRCA-related cancer screening. This may be done if you have a family history of breast, ovarian, tubal, or peritoneal cancers. Other tests  Sexually transmitted disease (STD) testing.  Bone density scan. This is done to screen for osteoporosis. You may have this done starting at age 43. Follow these instructions at home: Eating and drinking  Eat a diet that includes fresh fruits and vegetables, whole grains, lean protein, and low-fat dairy products. Limit your intake of foods with high amounts of sugar, saturated fats, and salt.  Take vitamin and mineral supplements as recommended by your health care provider.  Do not drink alcohol if your health care provider tells you not to drink.  If you drink alcohol: ? Limit how much you have to 0-1 drink a day. ? Be aware of how much alcohol is in your drink. In the [place] one drink equals one 12 oz bottle of beer (355 mL), one 5 oz glass of wine (148 mL), or one 1 oz glass of hard liquor (44 mL). Lifestyle  Take daily care of your teeth and gums.  Stay active. Exercise for at least 30 minutes on 5 or more days each week.  Do not use any products that contain nicotine or tobacco, such as  cigarettes, e-cigarettes, and chewing tobacco. If you need help quitting, ask your health care provider.  If you are sexually active, practice safe sex. Use a condom or other form of protection in order to prevent STIs (sexually transmitted infections).  Talk with your health care provider about taking a low-dose aspirin or statin. What's next?  Go to your health care provider once a year for a well check visit.  Ask your health care provider how often you should have your eyes and teeth checked.  Stay up to date on all vaccines. This information is not intended to replace advice given to you by your health care provider. Make sure you discuss any questions you have with your health care provider. Document Released: 11/04/2015 Document Revised: 10/02/2018 Document Reviewed: 10/02/2018 Elsevier Patient Education  2020 [employer].

## 2019-09-19 ENCOUNTER — Other Ambulatory Visit: Payer: Self-pay | Admitting: Obstetrics & Gynecology

## 2019-09-21 ENCOUNTER — Encounter: Payer: Medicare Other | Admitting: Obstetrics & Gynecology

## 2019-10-19 ENCOUNTER — Other Ambulatory Visit: Payer: Self-pay

## 2019-10-20 ENCOUNTER — Ambulatory Visit (INDEPENDENT_AMBULATORY_CARE_PROVIDER_SITE_OTHER): Payer: Medicare Other | Admitting: Obstetrics & Gynecology

## 2019-10-20 ENCOUNTER — Encounter: Payer: Self-pay | Admitting: Obstetrics & Gynecology

## 2019-10-20 VITALS — BP 134/80 | Ht 64.0 in | Wt 157.0 lb

## 2019-10-20 DIAGNOSIS — Z01419 Encounter for gynecological examination (general) (routine) without abnormal findings: Secondary | ICD-10-CM

## 2019-10-20 DIAGNOSIS — Z780 Asymptomatic menopausal state: Secondary | ICD-10-CM

## 2019-10-20 DIAGNOSIS — Z8619 Personal history of other infectious and parasitic diseases: Secondary | ICD-10-CM

## 2019-10-20 DIAGNOSIS — M8589 Other specified disorders of bone density and structure, multiple sites: Secondary | ICD-10-CM

## 2019-10-20 DIAGNOSIS — M85851 Other specified disorders of bone density and structure, right thigh: Secondary | ICD-10-CM

## 2019-10-20 MED ORDER — VALACYCLOVIR HCL 500 MG PO TABS: 500.0000 mg | ORAL_TABLET | Freq: Every day | ORAL | 4 refills | Status: DC

## 2019-10-20 MED ORDER — ALENDRONATE SODIUM 70 MG PO TABS: 70.0000 mg | ORAL_TABLET | ORAL | 4 refills | Status: DC

## 2019-10-20 NOTE — Patient Instructions (Signed)
  1. Well female exam with routine gynecological exam Normal gynecologic exam in menopause.  Abstinent.  Pap test negative in August 2018, no indication to repeat.  Breast exam normal.  Will call [facility] to schedule a screening mammogram now.  Health labs with family physician.  2. Postmenopausal Well on no hormone replacement therapy.  No postmenopausal bleeding.  3. Osteopenia of both hips Bone density December 2019 showed osteopenia.  Will continue on Fosamax.  Prescription sent to pharmacy.  Continue with weightbearing physical activities.  Vitamin D supplements, calcium intake of 1200 mg daily.  4. H/O herpes genitalis Controlled on valacyclovir prophylaxis.  Prescription sent to pharmacy.    Other orders - valACYclovir (VALTREX) 500 MG tablet; Take 1 tablet (500 mg total) by mouth daily. - alendronate (FOSAMAX) 70 MG tablet; Take 1 tablet (70 mg total) by mouth once a week. Take with a full glass of water on an empty stomach.  Sharon Robinson, it was a pleasure seeing you today!

## 2019-10-20 NOTE — Progress Notes (Signed)
Sharon Robinson 1946/01/31 [id number]   History:    73 y.o. G0 Single.  Retired Pharmacist, hospital.  RP:  Established patient presenting for annual gyn exam   HPI: Postmenopausal, well on no HRT.  No PMB.  No pelvic pain.  Abstinent.  Urine normal.  BMs normal.  Breasts normal. BMI 26.95.  Walking and yard work. Health labs with Fam MD.  Past medical history,surgical history, family history and social history were all reviewed and documented in the EPIC chart.  Gynecologic History No LMP recorded. Patient is postmenopausal.  Obstetric History OB History  Gravida Para Term Preterm AB Living  0 0 0 0 0 0  SAB TAB Ectopic Multiple Live Births  0 0 0 0 0     ROS: A ROS was performed and pertinent positives and negatives are included in the history.  GENERAL: No fevers or chills. HEENT: No change in vision, no earache, sore throat or sinus congestion. NECK: No pain or stiffness. CARDIOVASCULAR: No chest pain or pressure. No palpitations. PULMONARY: No shortness of breath, cough or wheeze. GASTROINTESTINAL: No abdominal pain, nausea, vomiting or diarrhea, melena or bright red blood per rectum. GENITOURINARY: No urinary frequency, urgency, hesitancy or dysuria. MUSCULOSKELETAL: No joint or muscle pain, no back pain, no recent trauma. DERMATOLOGIC: No rash, no itching, no lesions. ENDOCRINE: No polyuria, polydipsia, no heat or cold intolerance. No recent change in weight. HEMATOLOGICAL: No anemia or easy bruising or bleeding. NEUROLOGIC: No headache, seizures, numbness, tingling or weakness. PSYCHIATRIC: No depression, no loss of interest in normal activity or change in sleep pattern.     Exam:   BP 134/80 (BP Location: Right Arm, Patient Position: Sitting, Cuff Size: Normal)   Ht 5\' 4"  (1.626 m)   Wt 157 lb (71.2 kg)   BMI 26.95 kg/m   Body mass index is 26.95 kg/m.  General appearance : Well developed well nourished female. No acute distress HEENT: Eyes: no retinal hemorrhage or exudates,   Neck supple, trachea midline, no carotid bruits, no thyroidmegaly Lungs: Clear to auscultation, no rhonchi or wheezes, or rib retractions  Heart: Regular rate and rhythm, no murmurs or gallops Breast:Examined in sitting and supine position were symmetrical in appearance, no palpable masses or tenderness,  no skin retraction, no nipple inversion, no nipple discharge, no skin discoloration, no axillary or supraclavicular lymphadenopathy Abdomen: no palpable masses or tenderness, no rebound or guarding Extremities: no edema or skin discoloration or tenderness  Pelvic: Vulva: Normal             Vagina: No gross lesions or discharge  Cervix: No gross lesions or discharge  Uterus  AV, normal size, shape and consistency, non-tender and mobile  Adnexa  Without masses or tenderness  Anus: Normal   Assessment/Plan:  73 y.o. female for annual exam   1. Well female exam with routine gynecological exam Normal gynecologic exam in menopause.  Abstinent.  Pap test negative in August 2018, no indication to repeat.  Breast exam normal.  Will call [facility] to schedule a screening mammogram now.  Health labs with family physician.  2. Postmenopausal Well on no hormone replacement therapy.  No postmenopausal bleeding.  3. Osteopenia of both hips Bone density December 2019 showed osteopenia.  Will continue on Fosamax.  Prescription sent to pharmacy.  Continue with weightbearing physical activities.  Vitamin D supplements, calcium intake of 1200 mg daily.  4. H/O herpes genitalis Controlled on valacyclovir prophylaxis.  Prescription sent to pharmacy.    Other orders -  valACYclovir (VALTREX) 500 MG tablet; Take 1 tablet (500 mg total) by mouth daily. - alendronate (FOSAMAX) 70 MG tablet; Take 1 tablet (70 mg total) by mouth once a week. Take with a full glass of water on an empty stomach.   Princess Bruins MD, 2:12 PM 10/20/2019

## 2019-12-02 DIAGNOSIS — H35371 Puckering of macula, right eye: Secondary | ICD-10-CM | POA: Diagnosis not present

## 2019-12-02 DIAGNOSIS — H524 Presbyopia: Secondary | ICD-10-CM | POA: Diagnosis not present

## 2019-12-20 ENCOUNTER — Ambulatory Visit: Payer: Medicare Other | Attending: Internal Medicine

## 2019-12-20 DIAGNOSIS — Z23 Encounter for immunization: Secondary | ICD-10-CM | POA: Insufficient documentation

## 2019-12-20 NOTE — Progress Notes (Signed)
   Covid-19 Vaccination Clinic  Name:  ITSUKO HIRANO    MRN: [id number] DOB: 09/27/1946  12/20/2019  Ms. Amend was observed post Covid-19 immunization for 15 minutes without incidence. She was provided with Vaccine Information Sheet and instruction to access the V-Safe system.   Ms. Youngdahl was instructed to call 911 with any severe reactions post vaccine: Marland Kitchen Difficulty breathing  . Swelling of your face and throat  . A fast heartbeat  . A bad rash all over your body  . Dizziness and weakness    Immunizations Administered    Name Date Dose VIS Date Route   Pfizer COVID-19 Vaccine 12/20/2019 12:00 PM 0.3 mL 10/02/2019 Intramuscular   Manufacturer: [place]   Lot: [id number]   [place]: [id number]

## 2020-01-19 ENCOUNTER — Ambulatory Visit: Payer: Medicare Other | Attending: Internal Medicine

## 2020-01-19 DIAGNOSIS — Z23 Encounter for immunization: Secondary | ICD-10-CM

## 2020-01-19 NOTE — Progress Notes (Signed)
   Covid-19 Vaccination Clinic  Name:  Sharon Robinson    MRN: [id number] DOB: 1946/09/14  01/19/2020  [name] was observed post Covid-19 immunization for 15 minutes without incident. She was provided with Vaccine Information Sheet and instruction to access the V-Safe system.   [name] was instructed to call 911 with any severe reactions post vaccine: Marland Kitchen Difficulty breathing  . Swelling of face and throat  . A fast heartbeat  . A bad rash all over body  . Dizziness and weakness   Immunizations Administered    Name Date Dose VIS Date Route   Pfizer COVID-19 Vaccine 01/19/2020 11:47 AM 0.3 mL 10/02/2019 Intramuscular   Manufacturer: [place]   Lot: [id number]   [place]: [id number]

## 2020-02-04 ENCOUNTER — Other Ambulatory Visit: Payer: Self-pay | Admitting: Family

## 2020-05-05 ENCOUNTER — Other Ambulatory Visit: Payer: Self-pay | Admitting: Family

## 2020-07-26 DIAGNOSIS — L814 Other melanin hyperpigmentation: Secondary | ICD-10-CM | POA: Diagnosis not present

## 2020-07-26 DIAGNOSIS — L570 Actinic keratosis: Secondary | ICD-10-CM | POA: Diagnosis not present

## 2020-07-26 DIAGNOSIS — L820 Inflamed seborrheic keratosis: Secondary | ICD-10-CM | POA: Diagnosis not present

## 2020-07-26 DIAGNOSIS — L738 Other specified follicular disorders: Secondary | ICD-10-CM | POA: Diagnosis not present

## 2020-07-26 DIAGNOSIS — D225 Melanocytic nevi of trunk: Secondary | ICD-10-CM | POA: Diagnosis not present

## 2020-08-05 ENCOUNTER — Other Ambulatory Visit: Payer: Self-pay | Admitting: Family

## 2020-08-05 MED ORDER — ATORVASTATIN CALCIUM 40 MG PO TABS: 40.0000 mg | ORAL_TABLET | Freq: Every day | ORAL | 0 refills | Status: DC

## 2020-08-05 NOTE — Addendum Note (Signed)
Addended by: Earnstine Regal on: 08/05/2020 03:30 PM   Modules accepted: Orders

## 2020-08-05 NOTE — Telephone Encounter (Signed)
resent

## 2020-08-25 DIAGNOSIS — Z1231 Encounter for screening mammogram for malignant neoplasm of breast: Secondary | ICD-10-CM | POA: Diagnosis not present

## 2020-09-22 ENCOUNTER — Telehealth: Payer: Self-pay | Admitting: Family

## 2020-09-22 ENCOUNTER — Ambulatory Visit: Payer: Medicare Other

## 2020-09-22 NOTE — Telephone Encounter (Signed)
LVM for pt to rtn my call at [phone] to r/s AWV with [facility] on 09/22/20

## 2020-10-05 ENCOUNTER — Other Ambulatory Visit: Payer: Self-pay

## 2020-10-05 ENCOUNTER — Ambulatory Visit (INDEPENDENT_AMBULATORY_CARE_PROVIDER_SITE_OTHER): Payer: Medicare Other

## 2020-10-05 VITALS — BP 120/60 | HR 79 | Temp 98.1°F | Ht 64.0 in | Wt 150.2 lb

## 2020-10-05 DIAGNOSIS — Z0000 Encounter for general adult medical examination without abnormal findings: Secondary | ICD-10-CM | POA: Diagnosis not present

## 2020-10-05 DIAGNOSIS — Z23 Encounter for immunization: Secondary | ICD-10-CM

## 2020-10-05 NOTE — Patient Instructions (Addendum)
Sharon Robinson , Thank you for taking time to come for your Medicare Wellness Visit. I appreciate your ongoing commitment to your health goals. Please review the following plan we discussed and let me know if I can assist you in the future.   Screening recommendations/referrals: Colonoscopy: Cologuard; due every 3 years Mammogram: 07/2020 Bone Density: 10/06/2018 Recommended yearly ophthalmology/optometry visit for glaucoma screening and checkup Recommended yearly dental visit for hygiene and checkup  Vaccinations: Influenza vaccine: 10/05/2020 Pneumococcal vaccine: up to date Tdap vaccine: 05/06/2014; due every 10 years Shingles vaccine: never done   Covid-19: up to daye  Advanced directives: Please bring a copy of your health care power of attorney and living will to the office at your convenience.  Conditions/risks identified: Yes; Reviewed health maintenance screenings with patient today and relevant education, vaccines, and/or referrals were provided. Please continue to do your personal lifestyle choices by: daily care of teeth and gums, regular physical activity (goal should be 5 days a week for 30 minutes), eat a healthy diet, avoid tobacco and drug use, limiting any alcohol intake, taking a low-dose aspirin (if not allergic or have been advised by your provider otherwise) and taking vitamins and minerals as recommended by your provider. Continue doing brain stimulating activities (puzzles, reading, adult coloring books, staying active) to keep memory sharp. Continue to eat heart healthy diet (full of fruits, vegetables, whole grains, lean protein, water--limit salt, fat, and sugar intake) and increase physical activity as tolerated.  Next appointment: Please schedule your next Medicare Wellness Visit with your Nurse Health Advisor in 1 year by calling [phone].   Preventive Care 78 Years and Older, Female Preventive care refers to lifestyle choices and visits with your health care  provider that can promote health and wellness. What does preventive care include?  A yearly physical exam. This is also called an annual well check.  Dental exams once or twice a year.  Routine eye exams. Ask your health care provider how often you should have your eyes checked.  Personal lifestyle choices, including:  Daily care of your teeth and gums.  Regular physical activity.  Eating a healthy diet.  Avoiding tobacco and drug use.  Limiting alcohol use.  Practicing safe sex.  Taking low-dose aspirin every day.  Taking vitamin and mineral supplements as recommended by your health care provider. What happens during an annual well check? The services and screenings done by your health care provider during your annual well check will depend on your age, overall health, lifestyle risk factors, and family history of disease. Counseling  Your health care provider may ask you questions about your:  Alcohol use.  Tobacco use.  Drug use.  Emotional well-being.  Home and relationship well-being.  Sexual activity.  Eating habits.  History of falls.  Memory and ability to understand (cognition).  Work and work Statistician.  Reproductive health. Screening  You may have the following tests or measurements:  Height, weight, and BMI.  Blood pressure.  Lipid and cholesterol levels. These may be checked every 5 years, or more frequently if you are over 79 years old.  Skin check.  Lung cancer screening. You may have this screening every year starting at age 44 if you have a 30-pack-year history of smoking and currently smoke or have quit within the past 15 years.  Fecal occult blood test (FOBT) of the stool. You may have this test every year starting at age 63.  Flexible sigmoidoscopy or colonoscopy. You may have a sigmoidoscopy every 5  years or a colonoscopy every 10 years starting at age 55.  Hepatitis C blood test.  Hepatitis B blood test.  Sexually  transmitted disease (STD) testing.  Diabetes screening. This is done by checking your blood sugar (glucose) after you have not eaten for a while (fasting). You may have this done every 1-3 years.  Bone density scan. This is done to screen for osteoporosis. You may have this done starting at age 33.  Mammogram. This may be done every 1-2 years. Talk to your health care provider about how often you should have regular mammograms. Talk with your health care provider about your test results, treatment options, and if necessary, the need for more tests. Vaccines  Your health care provider may recommend certain vaccines, such as:  Influenza vaccine. This is recommended every year.  Tetanus, diphtheria, and acellular pertussis (Tdap, Td) vaccine. You may need a Td booster every 10 years.  Zoster vaccine. You may need this after age 30.  Pneumococcal 13-valent conjugate (PCV13) vaccine. One dose is recommended after age 59.  Pneumococcal polysaccharide (PPSV23) vaccine. One dose is recommended after age 39. Talk to your health care provider about which screenings and vaccines you need and how often you need them. This information is not intended to replace advice given to you by your health care provider. Make sure you discuss any questions you have with your health care provider. Document Released: 11/04/2015 Document Revised: 06/27/2016 Document Reviewed: 08/09/2015 Elsevier Interactive Patient Education  2017 [place] Prevention in the Home Falls can cause injuries. They can happen to people of all ages. There are many things you can do to make your home safe and to help prevent falls. What can I do on the outside of my home?  Regularly fix the edges of walkways and driveways and fix any cracks.  Remove anything that might make you trip as you walk through a door, such as a raised step or threshold.  Trim any bushes or trees on the path to your home.  Use bright outdoor  lighting.  Clear any walking paths of anything that might make someone trip, such as rocks or tools.  Regularly check to see if handrails are loose or broken. Make sure that both sides of any steps have handrails.  Any raised decks and porches should have guardrails on the edges.  Have any leaves, snow, or ice cleared regularly.  Use sand or salt on walking paths during winter.  Clean up any spills in your garage right away. This includes oil or grease spills. What can I do in the bathroom?  Use night lights.  Install grab bars by the toilet and in the tub and shower. Do not use towel bars as grab bars.  Use non-skid mats or decals in the tub or shower.  If you need to sit down in the shower, use a plastic, non-slip stool.  Keep the floor dry. Clean up any water that spills on the floor as soon as it happens.  Remove soap buildup in the tub or shower regularly.  Attach bath mats securely with double-sided non-slip rug tape.  Do not have throw rugs and other things on the floor that can make you trip. What can I do in the bedroom?  Use night lights.  Make sure that you have a light by your bed that is easy to reach.  Do not use any sheets or blankets that are too big for your bed. They should not hang  down onto the floor.  Have a firm chair that has side arms. You can use this for support while you get dressed.  Do not have throw rugs and other things on the floor that can make you trip. What can I do in the kitchen?  Clean up any spills right away.  Avoid walking on wet floors.  Keep items that you use a lot in easy-to-reach places.  If you need to reach something above you, use a strong step stool that has a grab bar.  Keep electrical cords out of the way.  Do not use floor polish or wax that makes floors slippery. If you must use wax, use non-skid floor wax.  Do not have throw rugs and other things on the floor that can make you trip. What can I do with my  stairs?  Do not leave any items on the stairs.  Make sure that there are handrails on both sides of the stairs and use them. Fix handrails that are broken or loose. Make sure that handrails are as long as the stairways.  Check any carpeting to make sure that it is firmly attached to the stairs. Fix any carpet that is loose or worn.  Avoid having throw rugs at the top or bottom of the stairs. If you do have throw rugs, attach them to the floor with carpet tape.  Make sure that you have a light switch at the top of the stairs and the bottom of the stairs. If you do not have them, ask someone to add them for you. What else can I do to help prevent falls?  Wear shoes that:  Do not have high heels.  Have rubber bottoms.  Are comfortable and fit you well.  Are closed at the toe. Do not wear sandals.  If you use a stepladder:  Make sure that it is fully opened. Do not climb a closed stepladder.  Make sure that both sides of the stepladder are locked into place.  Ask someone to hold it for you, if possible.  Clearly mark and make sure that you can see:  Any grab bars or handrails.  First and last steps.  Where the edge of each step is.  Use tools that help you move around (mobility aids) if they are needed. These include:  Canes.  Walkers.  Scooters.  Crutches.  Turn on the lights when you go into a dark area. Replace any light bulbs as soon as they burn out.  Set up your furniture so you have a clear path. Avoid moving your furniture around.  If any of your floors are uneven, fix them.  If there are any pets around you, be aware of where they are.  Review your medicines with your doctor. Some medicines can make you feel dizzy. This can increase your chance of falling. Ask your doctor what other things that you can do to help prevent falls. This information is not intended to replace advice given to you by your health care provider. Make sure you discuss any  questions you have with your health care provider. Document Released: 08/04/2009 Document Revised: 03/15/2016 Document Reviewed: 11/12/2014 Elsevier Interactive Patient Education  2017 [employer].

## 2020-10-05 NOTE — Progress Notes (Addendum)
Subjective:   Sharon Robinson is a 74 y.o. female who presents for Medicare Annual (Subsequent) preventive examination.  Review of Systems    No ROS. Medicare Wellness Visit. Additional risk factors are reflected in social history. Cardiac Risk Factors include: advanced age (>51men, >36 women);dyslipidemia;family history of premature cardiovascular disease Sleep Patterns: No sleep issues, feels rested on waking and sleeps 8 hours nightly. Home Safety/Smoke Alarms: Feels safe in home; uses home alarm. Smoke alarms in place. Living environment: 1-story home; Lives alone; no needs for DME; good support system. Seat Belt Safety/Bike Helmet: Wears seat belt.    Objective:    Today's Vitals   10/05/20 1444  BP: 120/60  Pulse: 79  Temp: 98.1 F (36.7 C)  SpO2: 97%  Weight: 150 lb 3.2 oz (68.1 kg)  Height: 5\' 4"  (1.626 m)  PainSc: 0-No pain   Body mass index is 25.78 kg/m.  Advanced Directives 10/05/2020 09/03/2019 09/01/2018 10/08/2017 08/26/2017  Does Patient Have a Medical Advance Directive? Yes Yes No Yes Yes  Type of Paramedic of [place];Living will - - [place];Living will  Copy of [place] in Chart? No - copy requested No - copy requested - - No - copy requested  Would patient like information on creating a medical advance directive? - - No - Patient declined - -    Current Medications (verified) Outpatient Encounter Medications as of 10/05/2020  Medication Sig   alendronate (FOSAMAX) 70 MG tablet Take 1 tablet (70 mg total) by mouth once a week. Take with a full glass of water on an empty stomach.   atorvastatin (LIPITOR) 40 MG tablet Take 1 tablet (40 mg total) by mouth daily.   cetirizine (ZYRTEC ALLERGY) 10 MG tablet Take 1 tablet (10 mg total) by mouth daily.   Cholecalciferol (VITAMIN D) 2000 units CAPS Take 2,000 Units by mouth daily.    ketoconazole (NIZORAL) 2 %  cream Apply 1 application daily topically.   naproxen sodium (ALEVE) 220 MG tablet Take 220 mg by mouth as needed.   valACYclovir (VALTREX) 500 MG tablet Take 1 tablet (500 mg total) by mouth daily.   No facility-administered encounter medications on file as of 10/05/2020.    Allergies (verified) Patient has no known allergies.   History: Past Medical History:  Diagnosis Date   Degenerative disc disease, lumbar 08/13/2017   Xray on 08/12/17   DI (detrusor instability)    GERD (gastroesophageal reflux disease)    H/O vitamin D deficiency    Hyperlipidemia    Osteopenia    Sinusitis    related to smoking   STD (sexually transmitted disease)    HSV 2   Past Surgical History:  Procedure Laterality Date   CATARACT EXTRACTION Left    Jun 20 2015   OTHER SURGICAL HISTORY  1989   tubal ligation   OTHER SURGICAL HISTORY     T&A age 70   SHOULDER INJECTION Left 11/2018   Dr. Onnie Graham   TONSILLECTOMY     TUBAL LIGATION     Family History  Problem Relation Age of Onset   Heart attack Mother 22   Cancer Father 46       colon   Social History   Socioeconomic History   Marital status: Single    Spouse name: Not on file   Number of children: Not on file   Years of education: Not on file   Highest education  level: Not on file  Occupational History   Occupation: retired  Tobacco Use   Smoking status: Current Every Day Smoker    Packs/day: 0.50    Types: Cigarettes   Smokeless tobacco: Never Used  Vaping Use   Vaping Use: Never used  Substance and Sexual Activity   Alcohol use: Yes    Alcohol/week: 0.0 standard drinks    Comment: rare   Drug use: No   Sexual activity: Not Currently    Birth control/protection: Surgical, Post-menopausal    Comment: declinde insurance questions,des neg  Other Topics Concern   Not on file  Social History Narrative   Not on file   Social Determinants of Health   Financial Resource Strain: Low Risk     Difficulty of Paying Living Expenses: Not hard at all  Food Insecurity: No Food Insecurity   Worried About Charity fundraiser in the Last Year: Never true   [place] in the Last Year: Never true  Transportation Needs: No Transportation Needs   Lack of Transportation (Medical): No   Lack of Transportation (Non-Medical): No  Physical Activity: Sufficiently Active   Days of Exercise per Week: 5 days   Minutes of Exercise per Session: 30 min  Stress: No Stress Concern Present   Feeling of Stress : Not at all  Social Connections: Moderately Integrated   Frequency of Communication with Friends and Family: More than three times a week   Frequency of Social Gatherings with Friends and Family: More than three times a week   Attends Religious Services: More than 4 times per year   Active Member of Clubs or Organizations: Yes   Attends Music therapist: More than 4 times per year   Marital Status: Never married    Tobacco Counseling Ready to quit: Not Answered Counseling given: Not Answered   Clinical Intake:  Pre-visit preparation completed: Yes  Pain : No/denies pain Pain Score: 0-No pain     BMI - recorded: 25.78 Nutritional Status: BMI 25 -29 Overweight Nutritional Risks: Other (Comment) Diabetes: No  How often do you need to have someone help you when you read instructions, pamphlets, or other written materials from your doctor or pharmacy?: 1 - Never What is the last grade level you completed in school?: Master's Degree in Education  Diabetic? no  Interpreter Needed?: No  Information entered by :: Lisette Abu, LPN   Activities of Daily Living In your present state of health, do you have any difficulty performing the following activities: 10/05/2020  Hearing? N  Vision? N  Difficulty concentrating or making decisions? N  Walking or climbing stairs? N  Dressing or bathing? N  Doing errands, shopping? N  Preparing Food and  eating ? N  Using the Toilet? N  In the past six months, have you accidently leaked urine? Y  Do you have problems with loss of bowel control? N  Managing your Medications? N  Managing your Finances? N  Housekeeping or managing your Housekeeping? N  Some recent data might be hidden    Patient Care Team: Marrian Salvage, [place] as PCP - General (Internal Medicine) Justice Britain, MD as Consulting Physician (Orthopedic Surgery) Princess Bruins, MD as Consulting Physician (Obstetrics and Gynecology)  Indicate any recent Medical Services you may have received from other than [facility] providers in the past year (date may be approximate).     Assessment:   This is a routine wellness examination for [place].  Hearing/Vision screen No exam  data present  Dietary issues and exercise activities discussed: Current Exercise Habits: Home exercise routine, Type of exercise: walking;Other - see comments (yard work), Time (Minutes): 30, Frequency (Times/Week): 5, Weekly Exercise (Minutes/Week): 150, Intensity: Moderate, Exercise limited by: None identified  Goals     Patient Stated     Stay as healthy and as independent as possible.     Stay as active and as independent as possible     Stay as healthy and as independent as possible      Depression Screen PHQ 2/9 Scores 10/05/2020 09/03/2019 01/09/2019 09/01/2018 08/26/2017 12/25/2016  PHQ - 2 Score 0 0 0 0 0 0  PHQ- 9 Score - - - - 0 -    Fall Risk Fall Risk  10/05/2020 09/03/2019 09/01/2018 08/26/2017 12/25/2016  Falls in the past year? 0 0 0 No No  Number falls in past yr: 0 0 - - -  Injury with Fall? 0 0 - - -  Risk for fall due to : No Fall Risks - - - -  Follow up Falls evaluation completed;Education provided Falls prevention discussed - - -    FALL RISK PREVENTION PERTAINING TO THE HOME:  Any stairs in or around the home? No  If so, are there any without handrails? No  Home free of loose throw rugs in walkways, pet beds,  electrical cords, etc? Yes  Adequate lighting in your home to reduce risk of falls? Yes   ASSISTIVE DEVICES UTILIZED TO PREVENT FALLS:  Life alert? No  Use of a cane, walker or w/c? No  Grab bars in the bathroom? No  Shower chair or bench in shower? No  Elevated toilet seat or a handicapped toilet? No   TIMED UP AND GO:  Was the test performed? No .  Length of time to ambulate 10 feet: 0 sec.   Gait steady and fast without use of assistive device  Cognitive Function: MMSE - Mini Mental State Exam 09/01/2018 08/26/2017  Orientation to time 5 5  Orientation to Place 5 5  Registration 3 -  Attention/ Calculation 5 -  Recall 2 -  Language- name 2 objects 2 -  Language- repeat 1 -  Language- follow 3 step command 3 -  Language- read & follow direction 1 -  Write a sentence 1 -  Copy design 1 -  Total score 29 -     6CIT Screen 10/05/2020  What Year? 0 points  What month? 0 points  What time? 0 points  Count back from 20 0 points  Months in reverse 0 points  Repeat phrase 0 points  Total Score 0    Immunizations Immunization History  Administered Date(s) Administered   Fluad Quad(high Dose 65+) 09/03/2019   Hepatitis A, Adult 12/02/2017   PFIZER SARS-COV-2 Vaccination 12/20/2019, 01/19/2020   Pneumococcal Conjugate-13 12/25/2016   Pneumococcal Polysaccharide-23 08/22/2017, 09/01/2018   Tdap 05/06/2014   Zoster 05/20/2013    TDAP status: Up to date  Flu Vaccine status: Up to date  Pneumococcal vaccine status: Up to date  Covid-19 vaccine status: Completed vaccines  Qualifies for Shingles Vaccine? Yes   Zostavax completed Yes   Shingrix Completed?: No.    Education has been provided regarding the importance of this vaccine. Patient has been advised to call insurance company to determine out of pocket expense if they have not yet received this vaccine. Advised may also receive vaccine at local pharmacy or Health Dept. Verbalized acceptance and  understanding.  Screening Tests Health  Maintenance  Topic Date Due   PAP SMEAR-Modifier  06/11/2019   INFLUENZA VACCINE  05/22/2020   COVID-19 Vaccine (3 - Booster for Pfizer series) 07/21/2020   TETANUS/TDAP  05/06/2024   DEXA SCAN  Completed   Hepatitis C Screening  Completed   PNA vac Low Risk Adult  Completed    Health Maintenance  Health Maintenance Due  Topic Date Due   PAP SMEAR-Modifier  06/11/2019   INFLUENZA VACCINE  05/22/2020   COVID-19 Vaccine (3 - Booster for Pfizer series) 07/21/2020    Colorectal cancer screening: No longer required.   Mammogram status: Completed 07/2020. Repeat every year  Bone Density status: Completed 10/06/2018. Results reflect: Bone density results: OSTEOPENIA. Repeat every 2 years.  Lung Cancer Screening: (Low Dose CT Chest recommended if Age 54-80 years, 30 pack-year currently smoking OR have quit w/in 15years.) does qualify.   Lung Cancer Screening Referral: no  Additional Screening:  Hepatitis C Screening: does qualify; Completed yes  Vision Screening: Recommended annual ophthalmology exams for early detection of glaucoma and other disorders of the eye. Is the patient up to date with their annual eye exam?  Yes  Who is the provider or what is the name of the office in which the patient attends annual eye exams? [facility] If pt is not established with a provider, would they like to be referred to a provider to establish care? No .   Dental Screening: Recommended annual dental exams for proper oral hygiene  Community Resource Referral / Chronic Care Management: CRR required this visit?  No   CCM required this visit?  No      Plan:     I have personally reviewed and noted the following in the patients chart:    Medical and social history  Use of alcohol, tobacco or illicit drugs   Current medications and supplements  Functional ability and status  Nutritional status  Physical activity  Advanced  directives  List of other physicians  Hospitalizations, surgeries, and ER visits in previous 12 months  Vitals  Screenings to include cognitive, depression, and falls  Referrals and appointments  In addition, I have reviewed and discussed with patient certain preventive protocols, quality metrics, and best practice recommendations. A written personalized care plan for preventive services as well as general preventive health recommendations were provided to patient.     Sheral Flow, LPN   09/47/0962   Nurse Notes: n/a  Medical screening examination/treatment/procedure(s) were performed by non-physician practitioner and as supervising provider I was immediately available for consultation/collaboration.  I agree with above. Marrian Salvage, FNP

## 2020-10-20 ENCOUNTER — Encounter: Payer: Medicare Other | Admitting: Obstetrics & Gynecology

## 2020-10-24 ENCOUNTER — Encounter: Payer: Medicare Other | Admitting: Obstetrics & Gynecology

## 2020-10-27 ENCOUNTER — Other Ambulatory Visit: Payer: Self-pay | Admitting: Obstetrics & Gynecology

## 2020-10-27 NOTE — Telephone Encounter (Signed)
annual exam scheduled on 11/01/20

## 2020-11-01 ENCOUNTER — Encounter: Payer: Self-pay | Admitting: Obstetrics & Gynecology

## 2020-11-01 ENCOUNTER — Ambulatory Visit: Payer: Medicare Other | Admitting: Obstetrics & Gynecology

## 2020-11-01 ENCOUNTER — Other Ambulatory Visit: Payer: Self-pay

## 2020-11-01 VITALS — BP 126/70 | Ht 63.5 in | Wt 149.0 lb

## 2020-11-01 DIAGNOSIS — Z01419 Encounter for gynecological examination (general) (routine) without abnormal findings: Secondary | ICD-10-CM

## 2020-11-01 DIAGNOSIS — M85852 Other specified disorders of bone density and structure, left thigh: Secondary | ICD-10-CM

## 2020-11-01 DIAGNOSIS — Z780 Asymptomatic menopausal state: Secondary | ICD-10-CM

## 2020-11-01 DIAGNOSIS — M85851 Other specified disorders of bone density and structure, right thigh: Secondary | ICD-10-CM | POA: Diagnosis not present

## 2020-11-01 NOTE — Progress Notes (Signed)
    Sharon Robinson 1946/09/01 [id number]   History:    75 y.o. G0 Single.  Retired Pharmacist, hospital.  PN:[id number] presenting for annual gyn exam   KDT:[id number], well on no HRT. No PMB. No pelvic pain. Abstinent. Urine normal, except from some SUI. BMs normal. Breasts normal. BMI 25.98.  Walking and yard work.Health labs with Fam MD.   Past medical history,surgical history, family history and social history were all reviewed and documented in the EPIC chart.  Gynecologic History No LMP recorded. Patient is postmenopausal.  Obstetric History OB History  Gravida Para Term Preterm AB Living  0 0 0 0 0 0  SAB IAB Ectopic Multiple Live Births  0 0 0 0 0     ROS: A ROS was performed and pertinent positives and negatives are included in the history.  GENERAL: No fevers or chills. HEENT: No change in vision, no earache, sore throat or sinus congestion. NECK: No pain or stiffness. CARDIOVASCULAR: No chest pain or pressure. No palpitations. PULMONARY: No shortness of breath, cough or wheeze. GASTROINTESTINAL: No abdominal pain, nausea, vomiting or diarrhea, melena or bright red blood per rectum. GENITOURINARY: No urinary frequency, urgency, hesitancy or dysuria. MUSCULOSKELETAL: No joint or muscle pain, no back pain, no recent trauma. DERMATOLOGIC: No rash, no itching, no lesions. ENDOCRINE: No polyuria, polydipsia, no heat or cold intolerance. No recent change in weight. HEMATOLOGICAL: No anemia or easy bruising or bleeding. NEUROLOGIC: No headache, seizures, numbness, tingling or weakness. PSYCHIATRIC: No depression, no loss of interest in normal activity or change in sleep pattern.     Exam:   BP 126/70   Ht 5' 3.5" (1.613 m)   Wt 149 lb (67.6 kg)   BMI 25.98 kg/m   Body mass index is 25.98 kg/m.  General appearance : Well developed well nourished female. No acute distress HEENT: Eyes: no retinal hemorrhage or exudates,  Neck supple, trachea midline, no carotid  bruits, no thyroidmegaly Lungs: Clear to auscultation, no rhonchi or wheezes, or rib retractions  Heart: Regular rate and rhythm, no murmurs or gallops Breast:Examined in sitting and supine position were symmetrical in appearance, no palpable masses or tenderness,  no skin retraction, no nipple inversion, no nipple discharge, no skin discoloration, no axillary or supraclavicular lymphadenopathy Abdomen: no palpable masses or tenderness, no rebound or guarding Extremities: no edema or skin discoloration or tenderness  Pelvic: Vulva: Normal             Vagina: No gross lesions or discharge  Cervix: No gross lesions or discharge  Uterus  AV, normal size, shape and consistency, non-tender and mobile  Adnexa  Without masses or tenderness  Anus: Normal   Assessment/Plan:  75 y.o. female for annual exam   1. Well female exam with routine gynecological exam Normal gynecologic exam in menopause.  No indication to repeat a Pap test this year.  Breast exam normal.  Mammogram November 2021 was negative.  We will do a Cologuard with family nurse practitioner.  Body mass index 25.98.  Continue with fitness and healthy nutrition.  2. Postmenopausal Well on no hormone replacement therapy.  No postmenopausal bleeding.  3. Osteopenia of both hips Osteopenia on bone density December 2019.  Repeat bone density here now.  Vitamin D supplements, calcium intake of 1500 mg daily and regular weightbearing physical activities. - DG Bone Density; Future  Princess Bruins MD, 4:33 PM 11/01/2020

## 2020-11-09 ENCOUNTER — Other Ambulatory Visit: Payer: Self-pay | Admitting: Family

## 2020-11-17 ENCOUNTER — Other Ambulatory Visit: Payer: Self-pay | Admitting: Obstetrics & Gynecology

## 2020-12-13 ENCOUNTER — Other Ambulatory Visit: Payer: Self-pay | Admitting: Obstetrics & Gynecology

## 2020-12-13 ENCOUNTER — Ambulatory Visit (INDEPENDENT_AMBULATORY_CARE_PROVIDER_SITE_OTHER): Payer: Medicare Other

## 2020-12-13 ENCOUNTER — Other Ambulatory Visit: Payer: Self-pay

## 2020-12-13 DIAGNOSIS — Z780 Asymptomatic menopausal state: Secondary | ICD-10-CM

## 2020-12-13 DIAGNOSIS — M85851 Other specified disorders of bone density and structure, right thigh: Secondary | ICD-10-CM

## 2020-12-13 DIAGNOSIS — M8589 Other specified disorders of bone density and structure, multiple sites: Secondary | ICD-10-CM | POA: Diagnosis not present

## 2021-01-09 DIAGNOSIS — Z9849 Cataract extraction status, unspecified eye: Secondary | ICD-10-CM | POA: Diagnosis not present

## 2021-01-09 DIAGNOSIS — Z961 Presence of intraocular lens: Secondary | ICD-10-CM | POA: Diagnosis not present

## 2021-01-09 DIAGNOSIS — H35371 Puckering of macula, right eye: Secondary | ICD-10-CM | POA: Diagnosis not present

## 2021-01-09 DIAGNOSIS — H04123 Dry eye syndrome of bilateral lacrimal glands: Secondary | ICD-10-CM | POA: Diagnosis not present

## 2021-01-09 DIAGNOSIS — H5203 Hypermetropia, bilateral: Secondary | ICD-10-CM | POA: Diagnosis not present

## 2021-01-13 ENCOUNTER — Other Ambulatory Visit: Payer: Self-pay | Admitting: Obstetrics & Gynecology

## 2021-01-13 NOTE — Telephone Encounter (Signed)
Recent dexa appears stable, continue Rx? Had annual exam in 1/22

## 2021-02-09 ENCOUNTER — Other Ambulatory Visit: Payer: Self-pay

## 2021-02-09 ENCOUNTER — Ambulatory Visit (INDEPENDENT_AMBULATORY_CARE_PROVIDER_SITE_OTHER): Payer: Medicare Other | Admitting: Internal Medicine

## 2021-02-09 ENCOUNTER — Encounter: Payer: Self-pay | Admitting: Internal Medicine

## 2021-02-09 VITALS — BP 136/64 | HR 69 | Temp 98.0°F | Ht 64.5 in | Wt 151.2 lb

## 2021-02-09 DIAGNOSIS — E7800 Pure hypercholesterolemia, unspecified: Secondary | ICD-10-CM

## 2021-02-09 DIAGNOSIS — M85852 Other specified disorders of bone density and structure, left thigh: Secondary | ICD-10-CM

## 2021-02-09 DIAGNOSIS — M85851 Other specified disorders of bone density and structure, right thigh: Secondary | ICD-10-CM | POA: Diagnosis not present

## 2021-02-09 DIAGNOSIS — K219 Gastro-esophageal reflux disease without esophagitis: Secondary | ICD-10-CM | POA: Diagnosis not present

## 2021-02-09 LAB — LIPID PANEL
Cholesterol: 124 mg/dL (ref 0–200)
HDL: 55.8 mg/dL (ref >39.00)
LDL Cholesterol: 45 mg/dL (ref 0–99)
NonHDL: 68.09
Total CHOL/HDL Ratio: 2
Triglycerides: 115 mg/dL (ref 0.0–149.0)
VLDL: 23 mg/dL (ref 0.0–40.0)

## 2021-02-09 LAB — HEPATIC FUNCTION PANEL
ALT: 16 U/L (ref 0–35)
AST: 19 U/L (ref 0–37)
Albumin: 3.9 g/dL (ref 3.5–5.2)
Alkaline Phosphatase: 60 U/L (ref 39–117)
Bilirubin, Direct: 0.1 mg/dL (ref 0.0–0.3)
Total Bilirubin: 0.4 mg/dL (ref 0.2–1.2)
Total Protein: 6.6 g/dL (ref 6.0–8.3)

## 2021-02-09 LAB — CBC WITH DIFFERENTIAL/PLATELET
Basophils Absolute: 0.1 10*3/uL (ref 0.0–0.1)
Basophils Relative: 1 % (ref 0.0–3.0)
Eosinophils Absolute: 0.1 10*3/uL (ref 0.0–0.7)
Eosinophils Relative: 0.7 % (ref 0.0–5.0)
HCT: 38 % (ref 36.0–46.0)
Hemoglobin: 13 g/dL (ref 12.0–15.0)
Lymphocytes Relative: 28.4 % (ref 12.0–46.0)
Lymphs Abs: 2.2 10*3/uL (ref 0.7–4.0)
MCHC: 34.2 g/dL (ref 30.0–36.0)
MCV: 104.9 fl — ABNORMAL HIGH (ref 78.0–100.0)
Monocytes Absolute: 0.6 10*3/uL (ref 0.1–1.0)
Monocytes Relative: 8.1 % (ref 3.0–12.0)
Neutro Abs: 4.7 10*3/uL (ref 1.4–7.7)
Neutrophils Relative %: 61.8 % (ref 43.0–77.0)
Platelets: 298 10*3/uL (ref 150.0–400.0)
RBC: 3.62 Mil/uL — ABNORMAL LOW (ref 3.87–5.11)
RDW: 13.1 % (ref 11.5–15.5)
WBC: 7.7 10*3/uL (ref 4.0–10.5)

## 2021-02-09 LAB — BASIC METABOLIC PANEL
BUN: 23 mg/dL (ref 6–23)
CO2: 31 mEq/L (ref 19–32)
Calcium: 9.5 mg/dL (ref 8.4–10.5)
Chloride: 104 mEq/L (ref 96–112)
Creatinine, Ser: 0.79 mg/dL (ref 0.40–1.20)
GFR: 72.42 mL/min (ref >60.00)
Glucose, Bld: 105 mg/dL — ABNORMAL HIGH (ref 70–99)
Potassium: 4.1 mEq/L (ref 3.5–5.1)
Sodium: 140 mEq/L (ref 135–145)

## 2021-02-09 LAB — VITAMIN D 25 HYDROXY (VIT D DEFICIENCY, FRACTURES): VITD: 70.3 ng/mL (ref 30.00–100.00)

## 2021-02-09 LAB — TSH: TSH: 1.08 u[IU]/mL (ref 0.35–4.50)

## 2021-02-09 NOTE — Patient Instructions (Signed)
High Cholesterol  High cholesterol is a condition in which the blood has high levels of a white, waxy substance similar to fat (cholesterol). The liver makes all the cholesterol that the body needs. The human body needs small amounts of cholesterol to help build cells. A person gets extra or excess cholesterol from the food that he or she eats. The blood carries cholesterol from the liver to the rest of the body. If you have high cholesterol, deposits (plaques) may build up on the walls of your arteries. Arteries are the blood vessels that carry blood away from your heart. These plaques make the arteries narrow and stiff. Cholesterol plaques increase your risk for heart attack and stroke. Work with your health care provider to keep your cholesterol levels in a healthy range. What increases the risk? The following factors may make you more likely to develop this condition:  Eating foods that are high in animal fat (saturated fat) or cholesterol.  Being overweight.  Not getting enough exercise.  A family history of high cholesterol (familial hypercholesterolemia).  Use of tobacco products.  Having diabetes. What are the signs or symptoms? There are no symptoms of this condition. How is this diagnosed? This condition may be diagnosed based on the results of a blood test.  If you are older than 77 years of age, your health care provider may check your cholesterol levels every 4-6 years.  You may be checked more often if you have high cholesterol or other risk factors for heart disease. The blood test for cholesterol measures:  "Bad" cholesterol, or LDL cholesterol. This is the main type of cholesterol that causes heart disease. The desired level is less than 100 mg/dL.  "Good" cholesterol, or HDL cholesterol. HDL helps protect against heart disease by cleaning the arteries and carrying the LDL to the liver for processing. The desired level for HDL is 60 mg/dL or higher.  Triglycerides.  These are fats that your body can store or burn for energy. The desired level is less than 150 mg/dL.  Total cholesterol. This measures the total amount of cholesterol in your blood and includes LDL, HDL, and triglycerides. The desired level is less than 200 mg/dL. How is this treated? This condition may be treated with:  Diet changes. You may be asked to eat foods that have more fiber and less saturated fats or added sugar.  Lifestyle changes. These may include regular exercise, maintaining a healthy weight, and quitting use of tobacco products.  Medicines. These are given when diet and lifestyle changes have not worked. You may be prescribed a statin medicine to help lower your cholesterol levels. Follow these instructions at home: Eating and drinking  Eat a healthy, balanced diet. This diet includes: ? Daily servings of a variety of fresh, frozen, or canned fruits and vegetables. ? Daily servings of whole grain foods that are rich in fiber. ? Foods that are low in saturated fats and trans fats. These include poultry and fish without skin, lean cuts of meat, and low-fat dairy products. ? A variety of fish, especially oily fish that contain omega-3 fatty acids. Aim to eat fish at least 2 times a week.  Avoid foods and drinks that have added sugar.  Use healthy cooking methods, such as roasting, grilling, broiling, baking, poaching, steaming, and stir-frying. Do not fry your food except for stir-frying.   Lifestyle  Get regular exercise. Aim to exercise for a total of 150 minutes a week. Increase your activity level by doing activities   such as gardening, walking, and taking the stairs.  Do not use any products that contain nicotine or tobacco, such as cigarettes, e-cigarettes, and chewing tobacco. If you need help quitting, ask your health care provider.   General instructions  Take over-the-counter and prescription medicines only as told by your health care provider.  Keep all  follow-up visits as told by your health care provider. This is important. Where to find more information  American Heart Association: www.heart.org  National Heart, Lung, and Blood Institute: www.nhlbi.nih.gov Contact a health care provider if:  You have trouble achieving or maintaining a healthy diet or weight.  You are starting an exercise program.  You are unable to stop smoking. Get help right away if:  You have chest pain.  You have trouble breathing.  You have any symptoms of a stroke. "BE FAST" is an easy way to remember the main warning signs of a stroke: ? B - Balance. Signs are dizziness, sudden trouble walking, or loss of balance. ? E - Eyes. Signs are trouble seeing or a sudden change in vision. ? F - Face. Signs are sudden weakness or numbness of the face, or the face or eyelid drooping on one side. ? A - Arms. Signs are weakness or numbness in an arm. This happens suddenly and usually on one side of the body. ? S - Speech. Signs are sudden trouble speaking, slurred speech, or trouble understanding what people say. ? T - Time. Time to call emergency services. Write down what time symptoms started.  You have other signs of a stroke, such as: ? A sudden, severe headache with no known cause. ? Nausea or vomiting. ? Seizure. These symptoms may represent a serious problem that is an emergency. Do not wait to see if the symptoms will go away. Get medical help right away. Call your local emergency services (911 in the [place] Do not drive yourself to the hospital. Summary  Cholesterol plaques increase your risk for heart attack and stroke. Work with your health care provider to keep your cholesterol levels in a healthy range.  Eat a healthy, balanced diet, get regular exercise, and maintain a healthy weight.  Do not use any products that contain nicotine or tobacco, such as cigarettes, e-cigarettes, and chewing tobacco.  Get help right away if you have any symptoms of a  stroke. This information is not intended to replace advice given to you by your health care provider. Make sure you discuss any questions you have with your health care provider. Document Revised: 09/07/2019 Document Reviewed: 09/07/2019 Elsevier Patient Education  2021 [employer]  

## 2021-02-09 NOTE — Progress Notes (Signed)
Subjective:  Patient ID: Sharon Robinson, female    DOB: 1945-11-03  Age: 75 y.o. MRN: [id number]  CC: Hyperlipidemia  This visit occurred during the SARS-CoV-2 public health emergency.  Safety protocols were in place, including screening questions prior to the visit, additional usage of staff PPE, and extensive cleaning of exam room while observing appropriate contact time as indicated for disinfecting solutions.    HPI JOCELYNE REINERTSEN presents for f/up and to establish.  She takes a statin for cardiovascular risk reduction.  She has felt well recently and offers no complaints.  She has pain in her large joints which is well controlled with naproxen.  She is active and denies any recent episodes of chest pain, shortness of breath, palpitations, edema, or fatigue.  Outpatient Medications Prior to Visit  Medication Sig Dispense Refill  . alendronate (FOSAMAX) 70 MG tablet TAKE 1 TAB ONCE A WEEK, AT LEAST 30 MIN BEFORE 1ST FOOD.DO NOT LIE DOWN FOR 30 MIN AFTER TAKING. 12 tablet 4  . atorvastatin (LIPITOR) 40 MG tablet TAKE 1 TABLET ONCE DAILY. 90 tablet 0  . cetirizine (ZYRTEC ALLERGY) 10 MG tablet Take 1 tablet (10 mg total) by mouth daily. 30 tablet 30  . Cholecalciferol (VITAMIN D) 2000 units CAPS Take 2,000 Units by mouth daily.     Marland Kitchen ketoconazole (NIZORAL) 2 % cream Apply 1 application daily topically.    . naproxen sodium (ALEVE) 220 MG tablet Take 220 mg by mouth as needed.    . valACYclovir (VALTREX) 500 MG tablet TAKE 1 TABLET ONCE DAILY. 30 tablet 3   No facility-administered medications prior to visit.    ROS Review of Systems  Constitutional: Negative for chills, diaphoresis, fatigue and fever.  HENT: Negative.   Eyes: Negative for visual disturbance.  Respiratory: Negative for cough, chest tightness, shortness of breath and wheezing.   Cardiovascular: Negative for chest pain, palpitations and leg swelling.  Gastrointestinal: Negative for abdominal pain, constipation,  diarrhea, nausea and vomiting.  Endocrine: Negative.   Genitourinary: Negative.  Negative for difficulty urinating.  Musculoskeletal: Positive for arthralgias. Negative for myalgias.  Skin: Negative.  Negative for color change and pallor.  Neurological: Negative.  Negative for dizziness, weakness, light-headedness and headaches.  Hematological: Negative for adenopathy. Does not bruise/bleed easily.  Psychiatric/Behavioral: Negative.     Objective:  BP 136/64 (BP Location: Right Arm, Patient Position: Sitting, Cuff Size: Large)   Pulse 69   Temp 98 F (36.7 C) (Oral)   Ht 5' 4.5" (1.638 m)   Wt 151 lb 3.2 oz (68.6 kg)   SpO2 99%   BMI 25.55 kg/m   BP Readings from Last 3 Encounters:  02/09/21 136/64  11/01/20 126/70  10/05/20 120/60    Wt Readings from Last 3 Encounters:  02/09/21 151 lb 3.2 oz (68.6 kg)  11/01/20 149 lb (67.6 kg)  10/05/20 150 lb 3.2 oz (68.1 kg)    Physical Exam Vitals reviewed.  HENT:     Nose: Nose normal.     Mouth/Throat:     Mouth: Mucous membranes are moist.  Eyes:     General: No scleral icterus.    Conjunctiva/sclera: Conjunctivae normal.  Cardiovascular:     Rate and Rhythm: Normal rate and regular rhythm.     Heart sounds: No murmur heard.   Pulmonary:     Effort: Pulmonary effort is normal.     Breath sounds: No stridor. No wheezing, rhonchi or rales.  Abdominal:     General: Abdomen is  flat. Bowel sounds are normal. There is no distension.     Palpations: Abdomen is soft. There is no hepatomegaly, splenomegaly or mass.     Tenderness: There is no abdominal tenderness.  Musculoskeletal:        General: Normal range of motion.     Cervical back: Neck supple.     Right lower leg: No edema.     Left lower leg: No edema.  Lymphadenopathy:     Cervical: No cervical adenopathy.  Skin:    General: Skin is warm and dry.  Neurological:     General: No focal deficit present.     Mental Status: She is alert.  Psychiatric:         Mood and Affect: Mood normal.        Behavior: Behavior normal.     Lab Results  Component Value Date   WBC 7.7 02/09/2021   HGB 13.0 02/09/2021   HCT 38.0 02/09/2021   PLT 298.0 02/09/2021   GLUCOSE 105 (H) 02/09/2021   CHOL 124 02/09/2021   TRIG 115.0 02/09/2021   HDL 55.80 02/09/2021   LDLCALC 45 02/09/2021   ALT 16 02/09/2021   AST 19 02/09/2021   NA 140 02/09/2021   K 4.1 02/09/2021   CL 104 02/09/2021   CREATININE 0.79 02/09/2021   BUN 23 02/09/2021   CO2 31 02/09/2021   TSH 1.08 02/09/2021    DG Lumbar Spine Complete  Result Date: 08/12/2017 CLINICAL DATA:  Chronic left-sided low back pain without sciatica without known injury. EXAM: LUMBAR SPINE - COMPLETE 4+ VIEW COMPARISON:  Radiographs of June 03, 2015. FINDINGS: Minimal grade 1 anterolisthesis of L4-5 is noted secondary to posterior facet joint hypertrophy. Mild degenerative disc disease is noted at L2-3 and L4-5. No fracture is noted. Degenerative changes seen involving posterior facet joints of L4-5 and L5-S1. IMPRESSION: Multilevel degenerative disc disease. No acute abnormality seen in the lumbar spine. Electronically Signed   By: Marijo Conception, M.D.   On: 08/12/2017 16:23    Assessment & Plan:   Marieelena was seen today for hyperlipidemia.  Diagnoses and all orders for this visit:  Hypercholesterolemia- She has achieved her LDL goal is doing well on the statin. -     Lipid panel; Future -     Hepatic function panel; Future -     TSH; Future -     TSH -     Hepatic function panel -     Lipid panel  Gastroesophageal reflux disease without esophagitis-she has no symptoms or complications related to this. -     CBC with Differential/Platelet; Future -     CBC with Differential/Platelet  Osteopenia of both hips- Her renal function and vitamin D level are normal. -     Basic metabolic panel; Future -     VITAMIN D 25 Hydroxy (Vit-D Deficiency, Fractures); Future -     VITAMIN D 25 Hydroxy (Vit-D  Deficiency, Fractures) -     Basic metabolic panel   I am having Jazarah Capili. Prats "Jonni Sanger" maintain her Vitamin D, cetirizine, ketoconazole, naproxen sodium, atorvastatin, valACYclovir, and alendronate.  No orders of the defined types were placed in this encounter.    Follow-up: Return in about 6 months (around 08/11/2021).  Scarlette Calico, MD

## 2021-02-13 ENCOUNTER — Other Ambulatory Visit: Payer: Self-pay | Admitting: Family

## 2021-03-12 NOTE — Progress Notes (Signed)
Subjective:    Patient ID: Sharon Robinson, female    DOB: [birth year], 75 y.o.   MRN: [id number]  HPI The patient is here for an acute visit.   Left 4th toe swollen - a book fell on her left foot on 02/14/21.  The toe is swollen and painful.  It hurts to walk.  It has not improved much, which was concerning to her.    Aleve has helped - she takes 1-2 aleve when she takes it.  She denies any stomach issues from the medication.   Medications and allergies reviewed with patient and updated if appropriate.  Patient Active Problem List   Diagnosis Date Noted  . Degenerative disc disease, lumbar 08/13/2017  . H/O vitamin D deficiency 06/06/2016  . Coccydynia 06/01/2015  . Low back pain 08/19/2014  . OAB (overactive bladder) 04/16/2013  . Chest pain at rest 11/03/2012  . Tobacco abuse 08/26/2012  . DI (detrusor instability)   . STD (sexually transmitted disease)   . Osteopenia   . Hypercholesterolemia 08/20/2011  . Sinusitis   . GERD (gastroesophageal reflux disease)     Current Outpatient Medications on File Prior to Visit  Medication Sig Dispense Refill  . alendronate (FOSAMAX) 70 MG tablet TAKE 1 TAB ONCE A WEEK, AT LEAST 30 MIN BEFORE 1ST FOOD.DO NOT LIE DOWN FOR 30 MIN AFTER TAKING. 12 tablet 4  . atorvastatin (LIPITOR) 40 MG tablet TAKE 1 TABLET ONCE DAILY. 90 tablet 1  . cetirizine (ZYRTEC ALLERGY) 10 MG tablet Take 1 tablet (10 mg total) by mouth daily. 30 tablet 30  . Cholecalciferol (VITAMIN D) 2000 units CAPS Take 2,000 Units by mouth daily.     Marland Kitchen ketoconazole (NIZORAL) 2 % cream Apply 1 application daily topically.    . naproxen sodium (ALEVE) 220 MG tablet Take 220 mg by mouth as needed.    . valACYclovir (VALTREX) 500 MG tablet TAKE 1 TABLET ONCE DAILY. 30 tablet 3   No current facility-administered medications on file prior to visit.    Past Medical History:  Diagnosis Date  . Degenerative disc disease, lumbar 08/13/2017   Xray on 08/12/17  . DI (detrusor  instability)   . GERD (gastroesophageal reflux disease)   . H/O vitamin D deficiency   . Hyperlipidemia   . Osteopenia   . Sinusitis    related to smoking  . STD (sexually transmitted disease)    HSV 2    Past Surgical History:  Procedure Laterality Date  . CATARACT EXTRACTION Left    Jun 20 2015  . OTHER SURGICAL HISTORY  1989   tubal ligation  . OTHER SURGICAL HISTORY     T&A age 21  . SHOULDER INJECTION Left 11/2018   Dr. Onnie Graham  . TONSILLECTOMY    . TUBAL LIGATION      Social History   Socioeconomic History  . Marital status: Single    Spouse name: Not on file  . Number of children: Not on file  . Years of education: Not on file  . Highest education level: Not on file  Occupational History  . Occupation: retired  Tobacco Use  . Smoking status: Current Every Day Smoker    Packs/day: 0.50    Types: Cigarettes  . Smokeless tobacco: Never Used  Vaping Use  . Vaping Use: Never used  Substance and Sexual Activity  . Alcohol use: Yes    Alcohol/week: 0.0 standard drinks    Comment: rare  . Drug use: No  .  Sexual activity: Not Currently    Birth control/protection: Surgical, Post-menopausal    Comment: declinde insurance questions,des neg  Other Topics Concern  . Not on file  Social History Narrative  . Not on file   Social Determinants of Health   Financial Resource Strain: Low Risk   . Difficulty of Paying Living Expenses: Not hard at all  Food Insecurity: No Food Insecurity  . Worried About Charity fundraiser in the Last Year: Never true  . Ran Out of Food in the Last Year: Never true  Transportation Needs: No Transportation Needs  . Lack of Transportation (Medical): No  . Lack of Transportation (Non-Medical): No  Physical Activity: Sufficiently Active  . Days of Exercise per Week: 5 days  . Minutes of Exercise per Session: 30 min  Stress: No Stress Concern Present  . Feeling of Stress : Not at all  Social Connections: Moderately Integrated  .  Frequency of Communication with Friends and Family: More than three times a week  . Frequency of Social Gatherings with Friends and Family: More than three times a week  . Attends Religious Services: More than 4 times per year  . Active Member of Clubs or Organizations: Yes  . Attends Archivist Meetings: More than 4 times per year  . Marital Status: Never married    Family History  Problem Relation Age of Onset  . Heart attack Mother 32  . Cancer Father 53       colon    Review of Systems  Constitutional: Negative for chills and fever.  Musculoskeletal:       Toe swelling  Skin: Negative for wound (Denies any breaks in the skin when injury occurred).  Neurological: Negative for numbness.       Objective:   Vitals:   03/13/21 1414  BP: 130/70  Pulse: 74  Temp: 98.4 F (36.9 C)  SpO2: 97%   BP Readings from Last 3 Encounters:  03/13/21 130/70  02/09/21 136/64  11/01/20 126/70   Wt Readings from Last 3 Encounters:  03/13/21 150 lb (68 kg)  02/09/21 151 lb 3.2 oz (68.6 kg)  11/01/20 149 lb (67.6 kg)   Body mass index is 25.35 kg/m.   Physical Exam Constitutional:      General: She is not in acute distress.    Appearance: Normal appearance. She is not ill-appearing.  HENT:     Head: Normocephalic and atraumatic.  Musculoskeletal:     Comments: Left fourth toe with moderate swelling, slight erythema, no warmth, tenderness throughout toe phalanx and at base of toe.  No foot swelling or pain along fourth metatarsal.  Skin intact without fluctuance  Skin:    General: Skin is warm and dry.  Neurological:     Mental Status: She is alert.     Sensory: No sensory deficit.        DG Toe 4th Left CLINICAL DATA:  Dropped bulk on fourth toe with pain, initial encounter  EXAM: LEFT FOURTH TOE  COMPARISON:  None.  FINDINGS: There is a mildly displaced fracture in the distal aspect of the fourth proximal phalanx. Associated soft tissue swelling is  noted. No significant callus formation is noted at this time.  IMPRESSION: Fracture at the distal aspect of the fourth proximal phalanx.  Electronically Signed   By: Inez Catalina M.D.   On: 03/13/2021 15:45     Assessment & Plan:    Left fourth toe pain, fracture-distal, closed: Acute X-ray confirms distal  fourth toe fracture Continue Aleve 1-2 tabs twice daily with food for pain relief Advised her will take at least 4-6 weeks to heal At this point no further treatment or evaluation necessary She will call if bleeding the pain pain and swelling does not improve as expected   This visit occurred during the SARS-CoV-2 public health emergency.  Safety protocols were in place, including screening questions prior to the visit, additional usage of staff PPE, and extensive cleaning of exam room while observing appropriate contact time as indicated for disinfecting solutions.

## 2021-03-13 ENCOUNTER — Encounter: Payer: Self-pay | Admitting: Internal Medicine

## 2021-03-13 ENCOUNTER — Ambulatory Visit (INDEPENDENT_AMBULATORY_CARE_PROVIDER_SITE_OTHER): Payer: Medicare Other

## 2021-03-13 ENCOUNTER — Other Ambulatory Visit: Payer: Self-pay

## 2021-03-13 ENCOUNTER — Ambulatory Visit (INDEPENDENT_AMBULATORY_CARE_PROVIDER_SITE_OTHER): Payer: Medicare Other | Admitting: Internal Medicine

## 2021-03-13 VITALS — BP 130/70 | HR 74 | Temp 98.4°F | Ht 64.5 in | Wt 150.0 lb

## 2021-03-13 DIAGNOSIS — M79675 Pain in left toe(s): Secondary | ICD-10-CM

## 2021-03-13 DIAGNOSIS — S62617A Displaced fracture of proximal phalanx of left little finger, initial encounter for closed fracture: Secondary | ICD-10-CM | POA: Diagnosis not present

## 2021-03-13 DIAGNOSIS — S92535A Nondisplaced fracture of distal phalanx of left lesser toe(s), initial encounter for closed fracture: Secondary | ICD-10-CM

## 2021-03-13 NOTE — Patient Instructions (Addendum)
   Have an xray downstairs.      Take 2 aleve twice daily with food.  Take tylenol if the pain is mild.

## 2021-03-15 ENCOUNTER — Other Ambulatory Visit: Payer: Self-pay | Admitting: Obstetrics & Gynecology

## 2021-03-15 NOTE — Telephone Encounter (Signed)
Medication refill request: Valtrex  Last AEX:  11/01/20 Next AEX: nothing scheduled at this time  Last MMG (if hormonal medication request): NA Refill authorized: # 30 with 6 refills pended for today

## 2021-07-24 DIAGNOSIS — L820 Inflamed seborrheic keratosis: Secondary | ICD-10-CM | POA: Diagnosis not present

## 2021-07-24 DIAGNOSIS — L821 Other seborrheic keratosis: Secondary | ICD-10-CM | POA: Diagnosis not present

## 2021-07-24 DIAGNOSIS — I788 Other diseases of capillaries: Secondary | ICD-10-CM | POA: Diagnosis not present

## 2021-07-24 DIAGNOSIS — L853 Xerosis cutis: Secondary | ICD-10-CM | POA: Diagnosis not present

## 2021-07-24 DIAGNOSIS — D2271 Melanocytic nevi of right lower limb, including hip: Secondary | ICD-10-CM | POA: Diagnosis not present

## 2021-07-24 DIAGNOSIS — L570 Actinic keratosis: Secondary | ICD-10-CM | POA: Diagnosis not present

## 2021-08-16 ENCOUNTER — Other Ambulatory Visit: Payer: Self-pay | Admitting: Internal Medicine

## 2021-09-04 DIAGNOSIS — Z1231 Encounter for screening mammogram for malignant neoplasm of breast: Secondary | ICD-10-CM | POA: Diagnosis not present

## 2021-09-04 LAB — HM MAMMOGRAPHY

## 2021-10-10 ENCOUNTER — Encounter: Payer: Self-pay | Admitting: Obstetrics & Gynecology

## 2021-10-10 DIAGNOSIS — R922 Inconclusive mammogram: Secondary | ICD-10-CM | POA: Diagnosis not present

## 2021-10-10 LAB — HM MAMMOGRAPHY

## 2021-10-20 ENCOUNTER — Other Ambulatory Visit: Payer: Self-pay | Admitting: Obstetrics & Gynecology

## 2021-10-24 NOTE — Telephone Encounter (Signed)
AEX scheduled for 12/29/21. Mammo UTD

## 2021-10-27 ENCOUNTER — Encounter: Payer: Self-pay | Admitting: Internal Medicine

## 2021-11-24 ENCOUNTER — Ambulatory Visit: Payer: Medicare Other

## 2021-11-29 ENCOUNTER — Ambulatory Visit (HOSPITAL_COMMUNITY)
Admission: EM | Admit: 2021-11-29 | Discharge: 2021-11-29 | Disposition: A | Discharge status: Home / Self Care | Payer: Medicare Other | Attending: Physician Assistant | Admitting: Physician Assistant

## 2021-11-29 ENCOUNTER — Encounter (HOSPITAL_COMMUNITY): Payer: Self-pay | Admitting: Emergency Medicine

## 2021-11-29 ENCOUNTER — Other Ambulatory Visit: Payer: Self-pay

## 2021-11-29 DIAGNOSIS — S0101XA Laceration without foreign body of scalp, initial encounter: Secondary | ICD-10-CM | POA: Diagnosis not present

## 2021-11-29 NOTE — ED Provider Notes (Signed)
[place]    CSN: [id number] Arrival date & time: 11/29/21  1251      History   Chief Complaint No chief complaint on file.   HPI Sharon Robinson is a 76 y.o. female.   Seen briefly in triage [place] [facility] Laceration on the back of her head Not on blood thinner In her usual state of health otherwise No neck pain, N/T of extremities No other complaints   Past Medical History:  Diagnosis Date   Degenerative disc disease, lumbar 08/13/2017   Xray on 08/12/17   DI (detrusor instability)    GERD (gastroesophageal reflux disease)    H/O vitamin D deficiency    Hyperlipidemia    Osteopenia    Sinusitis    related to smoking   STD (sexually transmitted disease)    HSV 2    Patient Active Problem List   Diagnosis Date Noted   Degenerative disc disease, lumbar 08/13/2017   H/O vitamin D deficiency 06/06/2016   Coccydynia 06/01/2015   Low back pain 08/19/2014   OAB (overactive bladder) 04/16/2013   Chest pain at rest 11/03/2012   Tobacco abuse 08/26/2012   DI (detrusor instability)    STD (sexually transmitted disease)    Osteopenia    Hypercholesterolemia 08/20/2011   Sinusitis    GERD (gastroesophageal reflux disease)     Past Surgical History:  Procedure Laterality Date   CATARACT EXTRACTION Left    Jun 20 2015   OTHER SURGICAL HISTORY  1989   tubal ligation   OTHER SURGICAL HISTORY     T&A age 82   SHOULDER INJECTION Left 11/2018   Dr. Onnie Graham   TONSILLECTOMY     TUBAL LIGATION      OB History     Gravida  0   Para  0   Term  0   Preterm  0   AB  0   Living  0      SAB  0   IAB  0   Ectopic  0   Multiple  0   Live Births  0            Home Medications    Prior to Admission medications   Medication Sig Start Date End Date Taking? Authorizing Provider  alendronate (FOSAMAX) 70 MG tablet TAKE 1 TAB ONCE A WEEK, AT LEAST 30 MIN BEFORE 1ST FOOD.DO NOT LIE DOWN FOR 30 MIN  AFTER TAKING. 01/13/21   Princess Bruins, MD  atorvastatin (LIPITOR) 40 MG tablet TAKE ONE TABLET BY MOUTH ONCE DAILY 08/16/21   Janith Lima, MD  cetirizine (ZYRTEC ALLERGY) 10 MG tablet Take 1 tablet (10 mg total) by mouth daily. 12/25/16   Nche, Charlene Brooke, NP  Cholecalciferol (VITAMIN D) 2000 units CAPS Take 2,000 Units by mouth daily.     [provider]  ketoconazole (NIZORAL) 2 % cream Apply 1 application daily topically.    [provider]  naproxen sodium (ALEVE) 220 MG tablet Take 220 mg by mouth as needed.    [provider]  valACYclovir (VALTREX) 500 MG tablet TAKE ONE TABLET BY MOUTH ONCE DAILY 10/25/21   Princess Bruins, MD    Family History Family History  Problem Relation Age of Onset   Heart attack Mother 5   Cancer Father 38       colon    Social History Social History   Tobacco Use   Smoking status:  Every Day    Packs/day: 0.50    Types: Cigarettes   Smokeless tobacco: Never  Vaping Use   Vaping Use: Never used  Substance Use Topics   Alcohol use: Yes    Alcohol/week: 0.0 standard drinks    Comment: rare   Drug use: No     Allergies   Patient has no known allergies.   Review of Systems Review of Systems   Physical Exam Triage Vital Signs ED Triage Vitals  Enc Vitals Group     BP      Pulse      Resp      Temp      Temp src      SpO2      Weight      Height      Head Circumference      Peak Flow      Pain Score      Pain Loc      Pain Edu?      Excl. in [place]?    No data found.  Updated Vital Signs There were no vitals taken for this visit.  Visual Acuity Right Eye Distance:   Left Eye Distance:   Bilateral Distance:    Right Eye Near:   Left Eye Near:    Bilateral Near:     Physical Exam Constitutional:      General: She is not in acute distress.    Appearance: Normal appearance.  HENT:     Head:     Comments: 1.5 cm laceration in posterior left occipital scalp No TTP of  cranium Eyes:     Extraocular Movements: Extraocular movements intact.     Pupils: Pupils are equal, round, and reactive to light.  Neck:     Comments: No TTP of cervical spine No step off noted Pulmonary:     Effort: Pulmonary effort is normal.  Neurological:     General: No focal deficit present.     Mental Status: She is alert and oriented to person, place, and time.     Cranial Nerves: No cranial nerve deficit.     Sensory: No sensory deficit.     Motor: No weakness.     Gait: Gait normal.     UC Treatments / Results  Labs (all labs ordered are listed, but only abnormal results are displayed) Labs Reviewed - No data to display  EKG   Radiology No results found.  Procedures Procedures (including critical care time)  Medications Ordered in UC Medications - No data to display  Initial Impression / Assessment and Plan / UC Course  I have reviewed the triage vital signs and the nursing notes.  Pertinent labs & imaging results that were available during my care of the patient were reviewed by me and considered in my medical decision making (see chart for details).     Patient evaluated briefly in triage.  No indication for CT head at this time.  Okay to wait for further treatment at Urgent Care.   Final Clinical Impressions(s) / UC Diagnoses   Final diagnoses:  None   Discharge Instructions   None    ED Prescriptions   None    PDMP not reviewed this encounter.   Lanea Vankirk, Bernita Raisin, DO 11/29/21 1303

## 2021-11-29 NOTE — Discharge Instructions (Signed)
°  Follow up in one week for Staple Removal. Follow up sooner with any concerns.

## 2021-11-29 NOTE — ED Triage Notes (Signed)
Pt does not take blood thinners. Provider at bed side to eval PT

## 2021-11-29 NOTE — ED Provider Notes (Signed)
[place]    CSN: [id number] Arrival date & time: 11/29/21  1251      History   Chief Complaint Chief Complaint  Patient presents with   Fall   Head Injury    HPI Sharon Robinson is a 76 y.o. female.   Patient here today for evaluation of scalp laceration that occurred earlier today (about noon) when she accidentally slid from a chair at her church and is suspected to have struck the back of her head against a metal chair. She reports she did have some bleeding and she was evaluated by EMS who cleared her for further evaluation outpatient. Mel Almond, DO evaluated patient initially with clear neuro exam. She is up to date with tetanus vaccination. Bleeding is currently controlled. She denies any headaches, nausea or vomiting. She is not on anticoagulant therapy.   The history is provided by the patient.  Fall Pertinent negatives include no abdominal pain, no headaches and no shortness of breath.  Head Injury Associated symptoms: no headaches, no nausea and no vomiting    Past Medical History:  Diagnosis Date   Degenerative disc disease, lumbar 08/13/2017   Xray on 08/12/17   DI (detrusor instability)    GERD (gastroesophageal reflux disease)    H/O vitamin D deficiency    Hyperlipidemia    Osteopenia    Sinusitis    related to smoking   STD (sexually transmitted disease)    HSV 2    Patient Active Problem List   Diagnosis Date Noted   Degenerative disc disease, lumbar 08/13/2017   H/O vitamin D deficiency 06/06/2016   Coccydynia 06/01/2015   Low back pain 08/19/2014   OAB (overactive bladder) 04/16/2013   Chest pain at rest 11/03/2012   Tobacco abuse 08/26/2012   DI (detrusor instability)    STD (sexually transmitted disease)    Osteopenia    Hypercholesterolemia 08/20/2011   Sinusitis    GERD (gastroesophageal reflux disease)     Past Surgical History:  Procedure Laterality Date   CATARACT EXTRACTION Left    Jun 20 2015   OTHER SURGICAL HISTORY   1989   tubal ligation   OTHER SURGICAL HISTORY     T&A age 51   SHOULDER INJECTION Left 11/2018   Dr. Onnie Graham   TONSILLECTOMY     TUBAL LIGATION      OB History     Gravida  0   Para  0   Term  0   Preterm  0   AB  0   Living  0      SAB  0   IAB  0   Ectopic  0   Multiple  0   Live Births  0            Home Medications    Prior to Admission medications   Medication Sig Start Date End Date Taking? Authorizing Provider  alendronate (FOSAMAX) 70 MG tablet TAKE 1 TAB ONCE A WEEK, AT LEAST 30 MIN BEFORE 1ST FOOD.DO NOT LIE DOWN FOR 30 MIN AFTER TAKING. 01/13/21   Princess Bruins, MD  atorvastatin (LIPITOR) 40 MG tablet TAKE ONE TABLET BY MOUTH ONCE DAILY 08/16/21   Janith Lima, MD  cetirizine (ZYRTEC ALLERGY) 10 MG tablet Take 1 tablet (10 mg total) by mouth daily. 12/25/16   Nche, Charlene Brooke, NP  Cholecalciferol (VITAMIN D) 2000 units CAPS Take 2,000 Units by mouth daily.     [provider]  ketoconazole (NIZORAL) 2 %  cream Apply 1 application daily topically.    [provider]  naproxen sodium (ALEVE) 220 MG tablet Take 220 mg by mouth as needed.    [provider]  valACYclovir (VALTREX) 500 MG tablet TAKE ONE TABLET BY MOUTH ONCE DAILY 10/25/21   Princess Bruins, MD    Family History Family History  Problem Relation Age of Onset   Heart attack Mother 44   Cancer Father 40       colon    Social History Social History   Tobacco Use   Smoking status: Every Day    Packs/day: 0.50    Types: Cigarettes   Smokeless tobacco: Never  Vaping Use   Vaping Use: Never used  Substance Use Topics   Alcohol use: Yes    Alcohol/week: 0.0 standard drinks    Comment: rare   Drug use: No     Allergies   Patient has no known allergies.   Review of Systems Review of Systems  Constitutional:  Negative for chills and fever.  Eyes:  Negative for discharge and redness.  Respiratory:  Negative for shortness of breath.    Gastrointestinal:  Negative for abdominal pain, nausea and vomiting.  Skin:  Positive for wound. Negative for color change.  Neurological:  Negative for speech difficulty and headaches.    Physical Exam Triage Vital Signs ED Triage Vitals  Enc Vitals Group     BP      Pulse      Resp      Temp      Temp src      SpO2      Weight      Height      Head Circumference      Peak Flow      Pain Score      Pain Loc      Pain Edu?      Excl. in [place]?    No data found.  Updated Vital Signs BP (!) 148/63 (BP Location: Left Arm)    Pulse 69    Temp 97.7 F (36.5 C) (Oral)    Resp 17    SpO2 97%      Physical Exam Vitals and nursing note reviewed.  Constitutional:      General: She is not in acute distress.    Appearance: Normal appearance. She is not ill-appearing.  HENT:     Head: Normocephalic and atraumatic.     Comments: Approx 3 cm vertical laceration to posterior parietal scalp midline without active bleeding Eyes:     Conjunctiva/sclera: Conjunctivae normal.  Cardiovascular:     Rate and Rhythm: Normal rate.  Pulmonary:     Effort: Pulmonary effort is normal.  Neurological:     Mental Status: She is alert.  Psychiatric:        Mood and Affect: Mood normal.        Behavior: Behavior normal.        Thought Content: Thought content normal.     UC Treatments / Results  Labs (all labs ordered are listed, but only abnormal results are displayed) Labs Reviewed - No data to display  EKG   Radiology No results found.  Procedures Laceration Repair  Date/Time: 11/29/2021 3:02 PM Performed by: Francene Finders, PA-C Authorized by: Francene Finders, PA-C   Consent:    Consent obtained:  Verbal   Consent given by:  Patient   Risks discussed:  Infection, need for additional repair, pain and poor wound  healing   Alternatives discussed:  No treatment Universal protocol:    Procedure explained and questions answered to patient or proxy's satisfaction: yes      Relevant documents present and verified: yes     Required blood products, implants, devices, and special equipment available: yes     Patient identity confirmed:  Provided demographic data Anesthesia:    Anesthesia method:  None Laceration details:    Location:  Scalp   Scalp location:  L parietal   Length (cm):  3   Depth (mm):  1 Exploration:    Contaminated: no   Treatment:    Area cleansed with:  Chlorhexidine   Amount of cleaning:  Standard   Debridement:  None Skin repair:    Repair method:  Staples   Number of staples:  3 Approximation:    Approximation:  Close Repair type:    Repair type:  Simple Post-procedure details:    Dressing:  Open (no dressing)   Procedure completion:  Tolerated well, no immediate complications (including critical care time)  Medications Ordered in UC Medications - No data to display  Initial Impression / Assessment and Plan / UC Course  I have reviewed the triage vital signs and the nursing notes.  Pertinent labs & imaging results that were available during my care of the patient were reviewed by me and considered in my medical decision making (see chart for details).     Patient appears stable in office- staples applied to close wound. Recommended she avoid washing her hair for the next 24 hours and encouraged her to continue to monitor of any signs of concussion, head injury or infection. Encouraged follow up in one week for staple removal or sooner with any concerns.   Final Clinical Impressions(s) / UC Diagnoses   Final diagnoses:  Laceration of scalp, initial encounter     Discharge Instructions       Follow up in one week for Staple Removal. Follow up sooner with any concerns.        ED Prescriptions   None    PDMP not reviewed this encounter.   Francene Finders, PA-C 11/29/21 1504

## 2021-11-29 NOTE — ED Triage Notes (Signed)
Pt reports that was at [employer] and slid off chair and hit posterior head on a metal chair. Denies LOC. EMS was called and told that would need stitches.

## 2021-12-06 ENCOUNTER — Other Ambulatory Visit: Payer: Self-pay

## 2021-12-06 ENCOUNTER — Ambulatory Visit (HOSPITAL_COMMUNITY)
Admission: RE | Admit: 2021-12-06 | Discharge: 2021-12-06 | Disposition: A | Discharge status: Home / Self Care | Payer: Medicare Other | Source: Ambulatory Visit | Attending: Internal Medicine | Admitting: Internal Medicine

## 2021-12-06 NOTE — ED Triage Notes (Signed)
Pt presents for staples removal.

## 2021-12-15 ENCOUNTER — Ambulatory Visit (INDEPENDENT_AMBULATORY_CARE_PROVIDER_SITE_OTHER): Payer: Medicare Other

## 2021-12-15 DIAGNOSIS — Z0000 Encounter for general adult medical examination without abnormal findings: Secondary | ICD-10-CM | POA: Diagnosis not present

## 2021-12-15 NOTE — Patient Instructions (Signed)
Sharon Robinson , Thank you for taking time to come for your Medicare Wellness Visit. I appreciate your ongoing commitment to your health goals. Please review the following plan we discussed and let me know if I can assist you in the future.   Screening recommendations/referrals: Colonoscopy: no longer required  Mammogram: no longer required  Bone Density: 12/13/2020 Recommended yearly ophthalmology/optometry visit for glaucoma screening and checkup Recommended yearly dental visit for hygiene and checkup  Vaccinations: Influenza vaccine: completed  Pneumococcal vaccine: completed  Tdap vaccine: 05/06/2014 Shingles vaccine: will consider   Advanced directives: yes   Conditions/risks identified: none   Next appointment: none    Preventive Care 27 Years and Older, Female Preventive care refers to lifestyle choices and visits with your health care provider that can promote health and wellness. What does preventive care include? A yearly physical exam. This is also called an annual well check. Dental exams once or twice a year. Routine eye exams. Ask your health care provider how often you should have your eyes checked. Personal lifestyle choices, including: Daily care of your teeth and gums. Regular physical activity. Eating a healthy diet. Avoiding tobacco and drug use. Limiting alcohol use. Practicing safe sex. Taking low-dose aspirin every day. Taking vitamin and mineral supplements as recommended by your health care provider. What happens during an annual well check? The services and screenings done by your health care provider during your annual well check will depend on your age, overall health, lifestyle risk factors, and family history of disease. Counseling  Your health care provider may ask you questions about your: Alcohol use. Tobacco use. Drug use. Emotional well-being. Home and relationship well-being. Sexual activity. Eating habits. History of falls. Memory and  ability to understand (cognition). Work and work Statistician. Reproductive health. Screening  You may have the following tests or measurements: Height, weight, and BMI. Blood pressure. Lipid and cholesterol levels. These may be checked every 5 years, or more frequently if you are over 67 years old. Skin check. Lung cancer screening. You may have this screening every year starting at age 76 if you have a 30-pack-year history of smoking and currently smoke or have quit within the past 15 years. Fecal occult blood test (FOBT) of the stool. You may have this test every year starting at age 83. Flexible sigmoidoscopy or colonoscopy. You may have a sigmoidoscopy every 5 years or a colonoscopy every 10 years starting at age 51. Hepatitis C blood test. Hepatitis B blood test. Sexually transmitted disease (STD) testing. Diabetes screening. This is done by checking your blood sugar (glucose) after you have not eaten for a while (fasting). You may have this done every 1-3 years. Bone density scan. This is done to screen for osteoporosis. You may have this done starting at age 77. Mammogram. This may be done every 1-2 years. Talk to your health care provider about how often you should have regular mammograms. Talk with your health care provider about your test results, treatment options, and if necessary, the need for more tests. Vaccines  Your health care provider may recommend certain vaccines, such as: Influenza vaccine. This is recommended every year. Tetanus, diphtheria, and acellular pertussis (Tdap, Td) vaccine. You may need a Td booster every 10 years. Zoster vaccine. You may need this after age 78. Pneumococcal 13-valent conjugate (PCV13) vaccine. One dose is recommended after age 63. Pneumococcal polysaccharide (PPSV23) vaccine. One dose is recommended after age 28. Talk to your health care provider about which screenings and vaccines you  need and how often you need them. This information is  not intended to replace advice given to you by your health care provider. Make sure you discuss any questions you have with your health care provider. Document Released: 11/04/2015 Document Revised: 06/27/2016 Document Reviewed: 08/09/2015 Elsevier Interactive Patient Education  2017 [place] Prevention in the Home Falls can cause injuries. They can happen to people of all ages. There are many things you can do to make your home safe and to help prevent falls. What can I do on the outside of my home? Regularly fix the edges of walkways and driveways and fix any cracks. Remove anything that might make you trip as you walk through a door, such as a raised step or threshold. Trim any bushes or trees on the path to your home. Use bright outdoor lighting. Clear any walking paths of anything that might make someone trip, such as rocks or tools. Regularly check to see if handrails are loose or broken. Make sure that both sides of any steps have handrails. Any raised decks and porches should have guardrails on the edges. Have any leaves, snow, or ice cleared regularly. Use sand or salt on walking paths during winter. Clean up any spills in your garage right away. This includes oil or grease spills. What can I do in the bathroom? Use night lights. Install grab bars by the toilet and in the tub and shower. Do not use towel bars as grab bars. Use non-skid mats or decals in the tub or shower. If you need to sit down in the shower, use a plastic, non-slip stool. Keep the floor dry. Clean up any water that spills on the floor as soon as it happens. Remove soap buildup in the tub or shower regularly. Attach bath mats securely with double-sided non-slip rug tape. Do not have throw rugs and other things on the floor that can make you trip. What can I do in the bedroom? Use night lights. Make sure that you have a light by your bed that is easy to reach. Do not use any sheets or blankets that  are too big for your bed. They should not hang down onto the floor. Have a firm chair that has side arms. You can use this for support while you get dressed. Do not have throw rugs and other things on the floor that can make you trip. What can I do in the kitchen? Clean up any spills right away. Avoid walking on wet floors. Keep items that you use a lot in easy-to-reach places. If you need to reach something above you, use a strong step stool that has a grab bar. Keep electrical cords out of the way. Do not use floor polish or wax that makes floors slippery. If you must use wax, use non-skid floor wax. Do not have throw rugs and other things on the floor that can make you trip. What can I do with my stairs? Do not leave any items on the stairs. Make sure that there are handrails on both sides of the stairs and use them. Fix handrails that are broken or loose. Make sure that handrails are as long as the stairways. Check any carpeting to make sure that it is firmly attached to the stairs. Fix any carpet that is loose or worn. Avoid having throw rugs at the top or bottom of the stairs. If you do have throw rugs, attach them to the floor with carpet tape. Make sure that  you have a light switch at the top of the stairs and the bottom of the stairs. If you do not have them, ask someone to add them for you. What else can I do to help prevent falls? Wear shoes that: Do not have high heels. Have rubber bottoms. Are comfortable and fit you well. Are closed at the toe. Do not wear sandals. If you use a stepladder: Make sure that it is fully opened. Do not climb a closed stepladder. Make sure that both sides of the stepladder are locked into place. Ask someone to hold it for you, if possible. Clearly mark and make sure that you can see: Any grab bars or handrails. First and last steps. Where the edge of each step is. Use tools that help you move around (mobility aids) if they are needed. These  include: Canes. Walkers. Scooters. Crutches. Turn on the lights when you go into a dark area. Replace any light bulbs as soon as they burn out. Set up your furniture so you have a clear path. Avoid moving your furniture around. If any of your floors are uneven, fix them. If there are any pets around you, be aware of where they are. Review your medicines with your doctor. Some medicines can make you feel dizzy. This can increase your chance of falling. Ask your doctor what other things that you can do to help prevent falls. This information is not intended to replace advice given to you by your health care provider. Make sure you discuss any questions you have with your health care provider. Document Released: 08/04/2009 Document Revised: 03/15/2016 Document Reviewed: 11/12/2014 Elsevier Interactive Patient Education  2017 [employer].

## 2021-12-15 NOTE — Progress Notes (Signed)
Subjective:   Sharon Robinson is a 76 y.o. female who presents for Medicare Annual (Subsequent) preventive examination.  I connected with Eric Form today by telephone and verified that I am speaking with the correct person using two identifiers. Location patient: home Location provider: work Persons participating in the virtual visit: patient, provider.   I discussed the limitations, risks, security and privacy concerns of performing an evaluation and management service by telephone and the availability of in person appointments. I also discussed with the patient that there may be a patient responsible charge related to this service. The patient expressed understanding and verbally consented to this telephonic visit.    Interactive audio and video telecommunications were attempted between this provider and patient, however failed, due to patient having technical difficulties OR patient did not have access to video capability.  We continued and completed visit with audio only.    Review of Systems     Cardiac Risk Factors include: advanced age (>42men, >75 women)     Objective:    Today's Vitals   12/15/21 0949  PainSc: 2    There is no height or weight on file to calculate BMI.  Advanced Directives 12/15/2021 10/05/2020 09/03/2019 09/01/2018 10/08/2017 08/26/2017  Does Patient Have a Medical Advance Directive? Yes Yes Yes No Yes Yes  Type of Paramedic of [place];Living will Healthcare Power of [place];Living will - - [place];Living will  Copy of [place] in Chart? No - copy requested No - copy requested No - copy requested - - No - copy requested  Would patient like information on creating a medical advance directive? - - - No - Patient declined - -    Current Medications (verified) Outpatient Encounter Medications as of 12/15/2021  Medication Sig   alendronate (FOSAMAX) 70 MG  tablet TAKE 1 TAB ONCE A WEEK, AT LEAST 30 MIN BEFORE 1ST FOOD.DO NOT LIE DOWN FOR 30 MIN AFTER TAKING.   atorvastatin (LIPITOR) 40 MG tablet TAKE ONE TABLET BY MOUTH ONCE DAILY   cetirizine (ZYRTEC ALLERGY) 10 MG tablet Take 1 tablet (10 mg total) by mouth daily.   Cholecalciferol (VITAMIN D) 2000 units CAPS Take 2,000 Units by mouth daily.    ketoconazole (NIZORAL) 2 % cream Apply 1 application daily topically.   naproxen sodium (ALEVE) 220 MG tablet Take 220 mg by mouth as needed.   valACYclovir (VALTREX) 500 MG tablet TAKE ONE TABLET BY MOUTH ONCE DAILY   No facility-administered encounter medications on file as of 12/15/2021.    Allergies (verified) Patient has no known allergies.   History: Past Medical History:  Diagnosis Date   Degenerative disc disease, lumbar 08/13/2017   Xray on 08/12/17   DI (detrusor instability)    GERD (gastroesophageal reflux disease)    H/O vitamin D deficiency    Hyperlipidemia    Osteopenia    Sinusitis    related to smoking   STD (sexually transmitted disease)    HSV 2   Past Surgical History:  Procedure Laterality Date   CATARACT EXTRACTION Left    Jun 20 2015   OTHER SURGICAL HISTORY  1989   tubal ligation   OTHER SURGICAL HISTORY     T&A age 62   SHOULDER INJECTION Left 11/2018   Dr. Onnie Graham   TONSILLECTOMY     TUBAL LIGATION     Family History  Problem Relation Age of Onset   Heart attack Mother 55  Cancer Father 14       colon   Social History   Socioeconomic History   Marital status: Single    Spouse name: Not on file   Number of children: Not on file   Years of education: Not on file   Highest education level: Not on file  Occupational History   Occupation: retired  Tobacco Use   Smoking status: Every Day    Packs/day: 0.50    Types: Cigarettes   Smokeless tobacco: Never  Vaping Use   Vaping Use: Never used  Substance and Sexual Activity   Alcohol use: Yes    Alcohol/week: 0.0 standard drinks    Comment:  rare   Drug use: No   Sexual activity: Not Currently    Birth control/protection: Surgical, Post-menopausal    Comment: declinde insurance questions,des neg  Other Topics Concern   Not on file  Social History Narrative   Not on file   Social Determinants of Health   Financial Resource Strain: Low Risk    Difficulty of Paying Living Expenses: Not hard at all  Food Insecurity: No Food Insecurity   Worried About Charity fundraiser in the Last Year: Never true   [place] in the Last Year: Never true  Transportation Needs: No Transportation Needs   Lack of Transportation (Medical): No   Lack of Transportation (Non-Medical): No  Physical Activity: Insufficiently Active   Days of Exercise per Week: 3 days   Minutes of Exercise per Session: 30 min  Stress: No Stress Concern Present   Feeling of Stress : Not at all  Social Connections: Moderately Integrated   Frequency of Communication with Friends and Family: Twice a week   Frequency of Social Gatherings with Friends and Family: Twice a week   Attends Religious Services: More than 4 times per year   Active Member of [employer] or Organizations: Yes   Attends Music therapist: More than 4 times per year   Marital Status: Never married    Tobacco Counseling Ready to quit: Not Answered Counseling given: Not Answered   Clinical Intake:  Pre-visit preparation completed: Yes  Pain : 0-10 Pain Score: 2  Pain Type: Acute pain Pain Location: Head Pain Orientation: Posterior Pain Descriptors / Indicators: Burning Pain Onset: In the past 7 days Pain Frequency: Occasional Pain Relieving Factors: fall at church had staples in scalp Effect of Pain on Daily Activities: Aleve  Pain Relieving Factors: fall at church had staples in scalp  Nutritional Risks: None Diabetes: No  How often do you need to have someone help you when you read instructions, pamphlets, or other written materials from your doctor or pharmacy?:  1 - Never What is the last grade level you completed in school?: masters  Diabetic?no  Interpreter Needed?: No  Information entered by :: L.Atiyah Bauer,LPN   Activities of Daily Living In your present state of health, do you have any difficulty performing the following activities: 12/15/2021  Hearing? N  Vision? N  Difficulty concentrating or making decisions? N  Walking or climbing stairs? N  Dressing or bathing? N  Doing errands, shopping? N  Preparing Food and eating ? N  Using the Toilet? N  In the past six months, have you accidently leaked urine? N  Do you have problems with loss of bowel control? N  Managing your Medications? N  Managing your Finances? N  Some recent data might be hidden    Patient Care Team: Janith Lima,  MD as PCP - General (Internal Medicine) Justice Britain, MD as Consulting Physician (Orthopedic Surgery) Princess Bruins, MD as Consulting Physician (Obstetrics and Gynecology)  Indicate any recent Medical Services you may have received from other than [facility] providers in the past year (date may be approximate).     Assessment:   This is a routine wellness examination for [place].  Hearing/Vision screen No results found.  Dietary issues and exercise activities discussed: Current Exercise Habits: The patient does not participate in regular exercise at present, Exercise limited by: orthopedic condition(s)   Goals Addressed             This Visit's Progress    Patient Stated   On track    Stay as healthy and as independent as possible.       Depression Screen PHQ 2/9 Scores 12/15/2021 12/15/2021 02/09/2021 10/05/2020 09/03/2019 01/09/2019 09/01/2018  PHQ - 2 Score 0 0 0 0 0 0 0  PHQ- 9 Score - - - - - - -    Fall Risk Fall Risk  12/15/2021 02/09/2021 10/05/2020 09/03/2019 09/01/2018  Falls in the past year? 1 0 0 0 0  Number falls in past yr: 0 0 0 0 -  Injury with Fall? 1 0 0 0 -  Risk for fall due to : History of fall(s) - No Fall Risks  - -  Follow up Falls evaluation completed;Education provided - Falls evaluation completed;Education provided Falls prevention discussed -    FALL RISK PREVENTION PERTAINING TO THE HOME:  Any stairs in or around the home? Yes  If so, are there any without handrails? No  Home free of loose throw rugs in walkways, pet beds, electrical cords, etc? Yes  Adequate lighting in your home to reduce risk of falls? Yes   ASSISTIVE DEVICES UTILIZED TO PREVENT FALLS:  Life alert? No  Use of a cane, walker or w/c? No  Grab bars in the bathroom? Yes  Shower chair or bench in shower? Yes  Elevated toilet seat or a handicapped toilet? Yes    Cognitive Function:Normal cognitive status assessed by direct observation by this Nurse Health Advisor. No abnormalities found.   MMSE - Mini Mental State Exam 09/01/2018 08/26/2017  Orientation to time 5 5  Orientation to Place 5 5  Registration 3 -  Attention/ Calculation 5 -  Recall 2 -  Language- name 2 objects 2 -  Language- repeat 1 -  Language- follow 3 step command 3 -  Language- read & follow direction 1 -  Write a sentence 1 -  Copy design 1 -  Total score 29 -     6CIT Screen 10/05/2020  What Year? 0 points  What month? 0 points  What time? 0 points  Count back from 20 0 points  Months in reverse 0 points  Repeat phrase 0 points  Total Score 0    Immunizations Immunization History  Administered Date(s) Administered   Fluad Quad(high Dose 65+) 09/03/2019, 10/05/2020   Hepatitis A, Adult 12/02/2017   PFIZER(Purple Top)SARS-COV-2 Vaccination 12/20/2019, 01/19/2020   Pneumococcal Conjugate-13 12/25/2016   Pneumococcal Polysaccharide-23 08/22/2017, 09/01/2018   Tdap 05/06/2014   Zoster, Live 05/20/2013    TDAP status: Up to date  Flu Vaccine status: Up to date  Pneumococcal vaccine status: Up to date  Covid-19 vaccine status: Completed vaccines  Qualifies for Shingles Vaccine? Yes   Zostavax completed No   Shingrix  Completed?: No.    Education has been provided regarding the importance of this  vaccine. Patient has been advised to call insurance company to determine out of pocket expense if they have not yet received this vaccine. Advised may also receive vaccine at local pharmacy or Health Dept. Verbalized acceptance and understanding.  Screening Tests Health Maintenance  Topic Date Due   Zoster Vaccines- Shingrix (1 of 2) Never done   PAP SMEAR-Modifier  06/11/2019   COVID-19 Vaccine (3 - Booster for Pfizer series) 03/15/2020   INFLUENZA VACCINE  05/22/2021   TETANUS/TDAP  05/06/2024   Pneumonia Vaccine 89+ Years old  Completed   DEXA SCAN  Completed   Hepatitis C Screening  Completed   HPV VACCINES  Aged Out   COLONOSCOPY (Pts 45-67yrs Insurance coverage will need to be confirmed)  [place] Maintenance Due  Topic Date Due   Zoster Vaccines- Shingrix (1 of 2) Never done   PAP SMEAR-Modifier  06/11/2019   COVID-19 Vaccine (3 - Booster for Pfizer series) 03/15/2020   INFLUENZA VACCINE  05/22/2021    Colorectal cancer screening: No longer required.   Mammogram status: No longer required due to age.  Bone Density status: Completed 12/13/2020. Results reflect: Bone density results: OSTEOPENIA. Repeat every 5 years.  Lung Cancer Screening: (Low Dose CT Chest recommended if Age 44-80 years, 30 pack-year currently smoking OR have quit w/in 15years.) does not qualify.   Lung Cancer Screening Referral: n/a  Additional Screening:  Hepatitis C Screening: does not qualify; Completed 04/25/2017  Vision Screening: Recommended annual ophthalmology exams for early detection of glaucoma and other disorders of the eye. Is the patient up to date with their annual eye exam?  Yes  Who is the provider or what is the name of the office in which the patient attends annual eye exams? Dr.Miller  If pt is not established with a provider, would they like to be referred to a  provider to establish care? No .   Dental Screening: Recommended annual dental exams for proper oral hygiene  Community Resource Referral / Chronic Care Management: CRR required this visit?  No   CCM required this visit?  No      Plan:     I have personally reviewed and noted the following in the patients chart:   Medical and social history Use of alcohol, tobacco or illicit drugs  Current medications and supplements including opioid prescriptions.  Functional ability and status Nutritional status Physical activity Advanced directives List of other physicians Hospitalizations, surgeries, and ER visits in previous 12 months Vitals Screenings to include cognitive, depression, and falls Referrals and appointments  In addition, I have reviewed and discussed with patient certain preventive protocols, quality metrics, and best practice recommendations. A written personalized care plan for preventive services as well as general preventive health recommendations were provided to patient.     Randel Pigg, LPN   4/54/0981   Nurse Notes: none

## 2021-12-29 ENCOUNTER — Other Ambulatory Visit: Payer: Self-pay

## 2021-12-29 ENCOUNTER — Encounter: Payer: Self-pay | Admitting: Obstetrics & Gynecology

## 2021-12-29 ENCOUNTER — Ambulatory Visit (INDEPENDENT_AMBULATORY_CARE_PROVIDER_SITE_OTHER): Payer: Medicare Other | Admitting: Obstetrics & Gynecology

## 2021-12-29 VITALS — BP 112/62 | HR 68 | Resp 16 | Ht 63.75 in | Wt 142.0 lb

## 2021-12-29 DIAGNOSIS — Z780 Asymptomatic menopausal state: Secondary | ICD-10-CM

## 2021-12-29 DIAGNOSIS — Z9289 Personal history of other medical treatment: Secondary | ICD-10-CM | POA: Diagnosis not present

## 2021-12-29 DIAGNOSIS — M85851 Other specified disorders of bone density and structure, right thigh: Secondary | ICD-10-CM

## 2021-12-29 DIAGNOSIS — Z9189 Other specified personal risk factors, not elsewhere classified: Secondary | ICD-10-CM

## 2021-12-29 DIAGNOSIS — M85852 Other specified disorders of bone density and structure, left thigh: Secondary | ICD-10-CM | POA: Diagnosis not present

## 2021-12-29 DIAGNOSIS — Z01419 Encounter for gynecological examination (general) (routine) without abnormal findings: Secondary | ICD-10-CM

## 2021-12-29 DIAGNOSIS — Z8619 Personal history of other infectious and parasitic diseases: Secondary | ICD-10-CM | POA: Diagnosis not present

## 2021-12-29 MED ORDER — ALENDRONATE SODIUM 70 MG PO TABS: 70.0000 mg | ORAL_TABLET | ORAL | 4 refills | Status: DC

## 2021-12-29 MED ORDER — VALACYCLOVIR HCL 500 MG PO TABS: 500.0000 mg | ORAL_TABLET | Freq: Every day | ORAL | 4 refills | Status: DC

## 2021-12-29 NOTE — Progress Notes (Signed)
? ? ?Sharon Robinson [birth year] [id number] ? ? ?History:    76 y.o. G0 Single.  Retired Pharmacist, hospital. ?  ?RP:  Established patient presenting for annual gyn exam  ?  ?HPI: Postmenopausal, well on no HRT.  No PMB.  No pelvic pain.  Abstinent.  Last Pap Neg 05/2017. No h/o abnormal Pap.  Urine normal, except from some SUI.  BMs normal.  Breasts normal. Mammo 08/2021, Lt breast Negative 09/2021.  BD Osteopenia T-Score -2.3 at the Rt Femoral Neck 11/2020.  On Fosamax. Had a fall recently, no fracture. Will repeat a BD in 11/2022.  BMI 24.57.  Walking and yard work. Health labs with Fam MD. COLONOSCOPY: 2009 ? ?Past medical history,surgical history, family history and social history were all reviewed and documented in the EPIC chart. ? ?Gynecologic History ?No LMP recorded. Patient is postmenopausal. ? ?Obstetric History ?OB History  ?Gravida Para Term Preterm AB Living  ?0 0 0 0 0 0  ?SAB IAB Ectopic Multiple Live Births  ?0 0 0 0 0  ? ? ? ?ROS: A ROS was performed and pertinent positives and negatives are included in the history. ? GENERAL: No fevers or chills. HEENT: No change in vision, no earache, sore throat or sinus congestion. NECK: No pain or stiffness. CARDIOVASCULAR: No chest pain or pressure. No palpitations. PULMONARY: No shortness of breath, cough or wheeze. GASTROINTESTINAL: No abdominal pain, nausea, vomiting or diarrhea, melena or bright red blood per rectum. GENITOURINARY: No urinary frequency, urgency, hesitancy or dysuria. MUSCULOSKELETAL: No joint or muscle pain, no back pain, no recent trauma. DERMATOLOGIC: No rash, no itching, no lesions. ENDOCRINE: No polyuria, polydipsia, no heat or cold intolerance. No recent change in weight. HEMATOLOGICAL: No anemia or easy bruising or bleeding. NEUROLOGIC: No headache, seizures, numbness, tingling or weakness. PSYCHIATRIC: No depression, no loss of interest in normal activity or change in sleep pattern.  ?  ? ?Exam: ? ? ?BP 112/62   Pulse 68   Resp 16   Ht 5' 3.75"  (1.619 m)   Wt 142 lb (64.4 kg)   BMI 24.57 kg/m?  ? ?Body mass index is 24.57 kg/m?. ? ?General appearance : Well developed well nourished female. No acute distress ?HEENT: Eyes: no retinal hemorrhage or exudates,  Neck supple, trachea midline, no carotid bruits, no thyroidmegaly ?Lungs: Clear to auscultation, no rhonchi or wheezes, or rib retractions  ?Heart: Regular rate and rhythm, no murmurs or gallops ?Breast:Examined in sitting and supine position were symmetrical in appearance, no palpable masses or tenderness,  no skin retraction, no nipple inversion, no nipple discharge, no skin discoloration, no axillary or supraclavicular lymphadenopathy ?Abdomen: no palpable masses or tenderness, no rebound or guarding ?Extremities: no edema or skin discoloration or tenderness ? ?Pelvic: Vulva: Normal ?            Vagina: No gross lesions or discharge ? Cervix: No gross lesions or discharge ? Uterus  AV, normal size, shape and consistency, non-tender and mobile ? Adnexa  Without masses or tenderness ? Anus: Normal ? ? ?Assessment/Plan:  76 y.o. female for annual exam  ? ?1. Well female exam with routine gynecological exam ?Postmenopausal, well on no HRT.  No PMB.  No pelvic pain.  Abstinent.  Last Pap Neg 05/2017. No h/o abnormal Pap.  Urine normal, except from some SUI.  BMs normal.  Breasts normal. Mammo 08/2021, Lt breast Negative 09/2021.  BD Osteopenia T-Score -2.3 at the Rt Femoral Neck 11/2020.  On Fosamax.  Will repeat a  BD in 11/2022. Had a fall recently, no fracture.  BMI 24.57.  Walking and yard work. Health labs with Fam MD. COLONOSCOPY: 2009 ? ?2. Postmenopausal ?Postmenopausal, well on no HRT.  No PMB.  No pelvic pain.  Abstinent. ? ?3. Osteopenia of both hips ?BD Osteopenia T-Score -2.3 at the Rt Femoral Neck 11/2020.  On Fosamax.  Will repeat a BD in 11/2022. Had a fall recently, no fracture.  ?- DG Bone Density; Future ? ?4. H/O herpes genitalis ?Continue on Valtrex prophylaxis. ? ?5. Other specified  personal risk factors, not elsewhere classified ? ?6. Personal history of other medical treatment ? ?Other orders ?- valACYclovir (VALTREX) 500 MG tablet; Take 1 tablet (500 mg total) by mouth daily. ?- alendronate (FOSAMAX) 70 MG tablet; Take 1 tablet (70 mg total) by mouth once a week. Take with a full glass of water on an empty stomach.  ? ?Princess Bruins MD, 2:05 PM 12/29/2021 ? ?  ?

## 2022-01-19 DIAGNOSIS — Z961 Presence of intraocular lens: Secondary | ICD-10-CM | POA: Diagnosis not present

## 2022-01-19 DIAGNOSIS — H04123 Dry eye syndrome of bilateral lacrimal glands: Secondary | ICD-10-CM | POA: Diagnosis not present

## 2022-01-19 DIAGNOSIS — H35371 Puckering of macula, right eye: Secondary | ICD-10-CM | POA: Diagnosis not present

## 2022-01-19 DIAGNOSIS — Z9849 Cataract extraction status, unspecified eye: Secondary | ICD-10-CM | POA: Diagnosis not present

## 2022-02-25 ENCOUNTER — Other Ambulatory Visit: Payer: Self-pay | Admitting: Internal Medicine

## 2022-02-26 ENCOUNTER — Other Ambulatory Visit: Payer: Self-pay | Admitting: Internal Medicine

## 2022-02-26 DIAGNOSIS — E7800 Pure hypercholesterolemia, unspecified: Secondary | ICD-10-CM

## 2022-02-26 MED ORDER — ATORVASTATIN CALCIUM 40 MG PO TABS: 40.0000 mg | ORAL_TABLET | Freq: Every day | ORAL | 0 refills | Status: DC

## 2022-03-06 ENCOUNTER — Encounter: Payer: Self-pay | Admitting: Radiology

## 2022-03-06 NOTE — Progress Notes (Signed)
Dentist Karlene Lineman office called wanting to confirm its ok to proceed with tooth extraction while pt is on [place]. I faxed letter too [phone] ?

## 2022-04-03 ENCOUNTER — Encounter: Payer: Self-pay | Admitting: Internal Medicine

## 2022-04-03 ENCOUNTER — Ambulatory Visit (INDEPENDENT_AMBULATORY_CARE_PROVIDER_SITE_OTHER): Payer: Medicare Other | Admitting: Internal Medicine

## 2022-04-03 ENCOUNTER — Ambulatory Visit (INDEPENDENT_AMBULATORY_CARE_PROVIDER_SITE_OTHER): Payer: Medicare Other

## 2022-04-03 VITALS — BP 126/62 | HR 72 | Temp 98.0°F | Resp 16 | Ht 63.75 in | Wt 142.0 lb

## 2022-04-03 DIAGNOSIS — Z23 Encounter for immunization: Secondary | ICD-10-CM

## 2022-04-03 DIAGNOSIS — E7800 Pure hypercholesterolemia, unspecified: Secondary | ICD-10-CM

## 2022-04-03 DIAGNOSIS — K219 Gastro-esophageal reflux disease without esophagitis: Secondary | ICD-10-CM

## 2022-04-03 DIAGNOSIS — M85851 Other specified disorders of bone density and structure, right thigh: Secondary | ICD-10-CM | POA: Diagnosis not present

## 2022-04-03 DIAGNOSIS — G8929 Other chronic pain: Secondary | ICD-10-CM

## 2022-04-03 DIAGNOSIS — Z0001 Encounter for general adult medical examination with abnormal findings: Secondary | ICD-10-CM | POA: Diagnosis not present

## 2022-04-03 DIAGNOSIS — M85852 Other specified disorders of bone density and structure, left thigh: Secondary | ICD-10-CM | POA: Diagnosis not present

## 2022-04-03 DIAGNOSIS — M1612 Unilateral primary osteoarthritis, left hip: Secondary | ICD-10-CM

## 2022-04-03 DIAGNOSIS — M25552 Pain in left hip: Secondary | ICD-10-CM

## 2022-04-03 DIAGNOSIS — E559 Vitamin D deficiency, unspecified: Secondary | ICD-10-CM | POA: Diagnosis not present

## 2022-04-03 LAB — CBC WITH DIFFERENTIAL/PLATELET
Basophils Absolute: 0.1 10*3/uL (ref 0.0–0.1)
Basophils Relative: 1.1 % (ref 0.0–3.0)
Eosinophils Absolute: 0.1 10*3/uL (ref 0.0–0.7)
Eosinophils Relative: 0.9 % (ref 0.0–5.0)
HCT: 38.1 % (ref 36.0–46.0)
Hemoglobin: 12.7 g/dL (ref 12.0–15.0)
Lymphocytes Relative: 32.5 % (ref 12.0–46.0)
Lymphs Abs: 2.4 10*3/uL (ref 0.7–4.0)
MCHC: 33.3 g/dL (ref 30.0–36.0)
MCV: 106.1 fl — ABNORMAL HIGH (ref 78.0–100.0)
Monocytes Absolute: 0.7 10*3/uL (ref 0.1–1.0)
Monocytes Relative: 8.9 % (ref 3.0–12.0)
Neutro Abs: 4.2 10*3/uL (ref 1.4–7.7)
Neutrophils Relative %: 56.6 % (ref 43.0–77.0)
Platelets: 287 10*3/uL (ref 150.0–400.0)
RBC: 3.59 Mil/uL — ABNORMAL LOW (ref 3.87–5.11)
RDW: 13.1 % (ref 11.5–15.5)
WBC: 7.4 10*3/uL (ref 4.0–10.5)

## 2022-04-03 LAB — BASIC METABOLIC PANEL
BUN: 22 mg/dL (ref 6–23)
CO2: 30 mEq/L (ref 19–32)
Calcium: 9.8 mg/dL (ref 8.4–10.5)
Chloride: 103 mEq/L (ref 96–112)
Creatinine, Ser: 0.87 mg/dL (ref 0.40–1.20)
GFR: 63.99 mL/min (ref >60.00)
Glucose, Bld: 96 mg/dL (ref 70–99)
Potassium: 4.2 mEq/L (ref 3.5–5.1)
Sodium: 139 mEq/L (ref 135–145)

## 2022-04-03 LAB — VITAMIN D 25 HYDROXY (VIT D DEFICIENCY, FRACTURES): VITD: 74.05 ng/mL (ref 30.00–100.00)

## 2022-04-03 LAB — HEPATIC FUNCTION PANEL
ALT: 17 U/L (ref 0–35)
AST: 19 U/L (ref 0–37)
Albumin: 4.3 g/dL (ref 3.5–5.2)
Alkaline Phosphatase: 58 U/L (ref 39–117)
Bilirubin, Direct: 0.1 mg/dL (ref 0.0–0.3)
Total Bilirubin: 0.5 mg/dL (ref 0.2–1.2)
Total Protein: 6.8 g/dL (ref 6.0–8.3)

## 2022-04-03 LAB — LIPID PANEL
Cholesterol: 141 mg/dL (ref 0–200)
HDL: 62.9 mg/dL (ref >39.00)
LDL Cholesterol: 55 mg/dL (ref 0–99)
NonHDL: 78.59
Total CHOL/HDL Ratio: 2
Triglycerides: 117 mg/dL (ref 0.0–149.0)
VLDL: 23.4 mg/dL (ref 0.0–40.0)

## 2022-04-03 LAB — TSH: TSH: 1.05 u[IU]/mL (ref 0.35–5.50)

## 2022-04-03 MED ORDER — SHINGRIX 50 MCG/0.5ML IM SUSR: 0.5000 mL | Freq: Once | INTRAMUSCULAR | 1 refills | Status: AC

## 2022-04-03 NOTE — Patient Instructions (Signed)

## 2022-04-03 NOTE — Progress Notes (Unsigned)
   Subjective:  Patient ID: Sharon Robinson, female    DOB: 1945-12-03  Age: 76 y.o. MRN: [id number]  CC: No chief complaint on file.   HPI Sharon Robinson presents for ***  Outpatient Medications Prior to Visit  Medication Sig Dispense Refill   alendronate (FOSAMAX) 70 MG tablet Take 1 tablet (70 mg total) by mouth once a week. Take with a full glass of water on an empty stomach. 12 tablet 4   atorvastatin (LIPITOR) 40 MG tablet Take 1 tablet (40 mg total) by mouth daily. 90 tablet 0   cetirizine (ZYRTEC ALLERGY) 10 MG tablet Take 1 tablet (10 mg total) by mouth daily. 30 tablet 30   Cholecalciferol (VITAMIN D) 2000 units CAPS Take 2,000 Units by mouth daily.      naproxen sodium (ALEVE) 220 MG tablet Take 220 mg by mouth as needed.     valACYclovir (VALTREX) 500 MG tablet Take 1 tablet (500 mg total) by mouth daily. 90 tablet 4   No facility-administered medications prior to visit.    ROS Review of Systems  Objective:  Ht 5' 3.75" (1.619 m)   Wt 142 lb (64.4 kg)   BMI 24.57 kg/m   BP Readings from Last 3 Encounters:  12/29/21 112/62  11/29/21 (!) 148/63  03/13/21 130/70    Wt Readings from Last 3 Encounters:  04/03/22 142 lb (64.4 kg)  12/29/21 142 lb (64.4 kg)  03/13/21 150 lb (68 kg)    Physical Exam  Lab Results  Component Value Date   WBC 7.7 02/09/2021   HGB 13.0 02/09/2021   HCT 38.0 02/09/2021   PLT 298.0 02/09/2021   GLUCOSE 105 (H) 02/09/2021   CHOL 124 02/09/2021   TRIG 115.0 02/09/2021   HDL 55.80 02/09/2021   LDLCALC 45 02/09/2021   ALT 16 02/09/2021   AST 19 02/09/2021   NA 140 02/09/2021   K 4.1 02/09/2021   CL 104 02/09/2021   CREATININE 0.79 02/09/2021   BUN 23 02/09/2021   CO2 31 02/09/2021   TSH 1.08 02/09/2021    No results found.  Assessment & Plan:   Diagnoses and all orders for this visit:  Gastroesophageal reflux disease without esophagitis  Hypercholesterolemia   I am having Sharon Robinson "Sharon Robinson" maintain her Vitamin  D, cetirizine, naproxen sodium, valACYclovir, alendronate, and atorvastatin.  No orders of the defined types were placed in this encounter.    Follow-up: No follow-ups on file.  Scarlette Calico, MD

## 2022-04-04 DIAGNOSIS — Z23 Encounter for immunization: Secondary | ICD-10-CM | POA: Insufficient documentation

## 2022-04-04 DIAGNOSIS — M1612 Unilateral primary osteoarthritis, left hip: Secondary | ICD-10-CM | POA: Insufficient documentation

## 2022-04-13 ENCOUNTER — Encounter: Payer: Self-pay | Admitting: Orthopaedic Surgery

## 2022-04-13 ENCOUNTER — Ambulatory Visit: Payer: Medicare Other | Admitting: Orthopaedic Surgery

## 2022-04-13 DIAGNOSIS — M1612 Unilateral primary osteoarthritis, left hip: Secondary | ICD-10-CM | POA: Diagnosis not present

## 2022-05-01 NOTE — Progress Notes (Unsigned)
    Subjective:    CC: L hip pain  HPI: Pt is a 76 y/o female presenting w/ c/o L hip pain x one year.  She locates her pain to .  She was seen by Dr. Erlinda Hong on 04/13/22 who determined that pt has advanced L hip DJD and referred pt to [facility] for L hip intra-articular steroid injection.  Patient does get numbness that comes and goes that radiates down to her ankle sometimes keeps her up at night.  L hip mechanical symptoms: no Radiating pain: yes into her L thigh Aggravating factors: walking  Treatments tried:Aleve,   Diagnostic testing: L hip XR- 04/03/22   Pertinent review of Systems: No fevers or chills  Relevant historical information: Tobacco abuse.  GERD.   Objective:    Vitals:   05/02/22 1436  BP: 120/70  Pulse: 68  SpO2: 98%   General: Well Developed, well nourished, and in no acute distress.   MSK: Left hip: Normal-appearing Decreased hip range of motion some pain with hip flexion and rotation. Hip abduction strength is diminished  Lab and Radiology Results  Procedure: Real-time Ultrasound Guided Injection of left hip femoral acetabular joint anterior approach Device: Philips Affiniti 50G Images permanently stored and available for review in PACS Verbal informed consent obtained.  Discussed risks and benefits of procedure. Warned about infection, bleeding, hyperglycemia damage to structures among others. Patient expresses understanding and agreement Time-out conducted.   Noted no overlying erythema, induration, or other signs of local infection.   Skin prepped in a sterile fashion.   Local anesthesia: Topical Ethyl chloride.   With sterile technique and under real time ultrasound guidance: 40 mg of Kenalog and 2 mL of Marcaine injected into hip joint. Fluid seen entering the joint capsule.   Completed without difficulty   Pain immediately resolved suggesting accurate placement of the medication.   Advised to call if fevers/chills, erythema, induration,  drainage, or persistent bleeding.   Images permanently stored and available for review in the ultrasound unit.  Impression: Technically successful ultrasound guided injection.        Impression and Recommendations:    Assessment and Plan: 76 y.o. female with left hip pain thought to be primarily due to hip DJD.  She had immediate pretty good response to the injection in clinic indicating that a fair amount of her pain is due to hip DJD.  She had trouble articulating her pain before the injection so it is hard to tell how well the shot really worked but my best assessment is that she feels better after the shot.  Hopefully she will have a good long response and pain improvement from this steroid injection.  However if she does not recommend return to Dr Erlinda Hong to proceed to total hip replacement.Marland Kitchen  PDMP not reviewed this encounter. Orders Placed This Encounter  Procedures   [place] LIMITED JOINT SPACE STRUCTURES LOW LEFT(NO LINKED CHARGES)    Order Specific Question:   Reason for Exam (SYMPTOM  OR DIAGNOSIS REQUIRED)    Answer:   lt hip injection    Order Specific Question:   Preferred imaging location?    Answer:   [facility]   No orders of the defined types were placed in this encounter.   Discussed warning signs or symptoms. Please see discharge instructions. Patient expresses understanding.   The above documentation has been reviewed and is accurate and complete Lynne Leader, M.D.

## 2022-05-02 ENCOUNTER — Ambulatory Visit: Payer: Medicare Other | Admitting: Family Medicine

## 2022-05-02 ENCOUNTER — Ambulatory Visit: Payer: Self-pay

## 2022-05-02 VITALS — BP 120/70 | HR 68 | Ht 63.75 in | Wt 140.0 lb

## 2022-05-02 DIAGNOSIS — M25552 Pain in left hip: Secondary | ICD-10-CM | POA: Diagnosis not present

## 2022-05-02 DIAGNOSIS — G8929 Other chronic pain: Secondary | ICD-10-CM | POA: Diagnosis not present

## 2022-05-02 DIAGNOSIS — M1612 Unilateral primary osteoarthritis, left hip: Secondary | ICD-10-CM

## 2022-05-02 NOTE — Patient Instructions (Signed)
Thank you for coming in today.   Call or go to the ER if you develop a large red swollen joint with extreme pain or oozing puss.    Let me and Dr Erlinda Hong know how it feels for a few hours after the shot.   If better that tells [place] the pain is coming from the hip.

## 2022-05-24 ENCOUNTER — Telehealth: Payer: Self-pay | Admitting: Orthopaedic Surgery

## 2022-05-24 NOTE — Telephone Encounter (Signed)
Patient has decided on a date of 08-20-22 for left total hip.  Clearance will be sent to Dr. Scarlette Calico if patient will follow through with surgery.  She is requesting pre-service center provider her with an estimate so she will prepared financially for this surgery.

## 2022-05-30 ENCOUNTER — Other Ambulatory Visit: Payer: Self-pay | Admitting: Internal Medicine

## 2022-05-30 DIAGNOSIS — E7800 Pure hypercholesterolemia, unspecified: Secondary | ICD-10-CM

## 2022-06-12 ENCOUNTER — Other Ambulatory Visit: Payer: Self-pay

## 2022-07-23 ENCOUNTER — Ambulatory Visit (INDEPENDENT_AMBULATORY_CARE_PROVIDER_SITE_OTHER): Payer: Medicare Other

## 2022-07-23 ENCOUNTER — Ambulatory Visit: Payer: Medicare Other

## 2022-07-23 DIAGNOSIS — M1612 Unilateral primary osteoarthritis, left hip: Secondary | ICD-10-CM

## 2022-07-31 DIAGNOSIS — L821 Other seborrheic keratosis: Secondary | ICD-10-CM | POA: Diagnosis not present

## 2022-07-31 DIAGNOSIS — D225 Melanocytic nevi of trunk: Secondary | ICD-10-CM | POA: Diagnosis not present

## 2022-07-31 DIAGNOSIS — L814 Other melanin hyperpigmentation: Secondary | ICD-10-CM | POA: Diagnosis not present

## 2022-07-31 DIAGNOSIS — D2272 Melanocytic nevi of left lower limb, including hip: Secondary | ICD-10-CM | POA: Diagnosis not present

## 2022-07-31 DIAGNOSIS — D2271 Melanocytic nevi of right lower limb, including hip: Secondary | ICD-10-CM | POA: Diagnosis not present

## 2022-07-31 DIAGNOSIS — L570 Actinic keratosis: Secondary | ICD-10-CM | POA: Diagnosis not present

## 2022-07-31 DIAGNOSIS — D2261 Melanocytic nevi of right upper limb, including shoulder: Secondary | ICD-10-CM | POA: Diagnosis not present

## 2022-07-31 DIAGNOSIS — D1801 Hemangioma of skin and subcutaneous tissue: Secondary | ICD-10-CM | POA: Diagnosis not present

## 2022-07-31 DIAGNOSIS — D2262 Melanocytic nevi of left upper limb, including shoulder: Secondary | ICD-10-CM | POA: Diagnosis not present

## 2022-08-06 NOTE — Pre-Procedure Instructions (Signed)
Surgical Instructions    Your procedure is scheduled on Monday, October 30.  Report to [facility] Main Entrance "A" at 5:30 A.M., then check in with the Admitting office.  Call this number if you have problems the morning of surgery:  [phone]   If you have any questions prior to your surgery date call [phone]: Open Monday-Friday 8am-4pm If you experience any cold or flu symptoms such as cough, fever, chills, shortness of breath, etc. between now and your scheduled surgery, please notify us at the above number     Remember:  Do not eat after midnight the night before your surgery  You may drink clear liquids until 4:15AM the morning of your surgery.   Clear liquids allowed are: Water, Non-Citrus Juices (without pulp), Carbonated Beverages, Clear Tea, Black Coffee ONLY (NO MILK, CREAM OR POWDERED CREAMER of any kind), and Gatorade    Take these medicines the morning of surgery with A SIP OF WATER:  atorvastatin (LIPITOR)  cetirizine (ZYRTEC ALLERGY)   As of today, STOP taking any Aspirin (unless otherwise instructed by your surgeon) Aleve, Naproxen, Ibuprofen, Motrin, Advil, Goody's, BC's, all herbal medications, fish oil, and all vitamins.        [facility] is not responsible for any belongings or valuables.    Do NOT Smoke (Tobacco/Vaping)  24 hours prior to your procedure  If you use a CPAP at night, you may bring your mask for your overnight stay.   Contacts, glasses, hearing aids, dentures or partials may not be worn into surgery, please bring cases for these belongings   For patients admitted to the hospital, discharge time will be determined by your treatment team.   Patients discharged the day of surgery will not be allowed to drive home, and someone needs to stay with them for 24 hours.   SURGICAL WAITING ROOM VISITATION Patients having surgery or a procedure may have no more than 2 support people in the waiting area - these visitors may rotate.   Children  under the age of 76 must have an adult with them who is not the patient. If the patient needs to stay at the hospital during part of their recovery, the visitor guidelines for inpatient rooms apply. Pre-op nurse will coordinate an appropriate time for 1 support person to accompany patient in pre-op.  This support person may not rotate.   Please refer to the [facility] website for the visitor guidelines for Inpatients (after your surgery is over and you are in a regular room).    Special instructions:    Oral Hygiene is also important to reduce your risk of infection.  Remember - BRUSH YOUR TEETH THE MORNING OF SURGERY WITH YOUR REGULAR TOOTHPASTE   [place]- Preparing For Surgery  Before surgery, you can play an important role. Because skin is not sterile, your skin needs to be as free of germs as possible. You can reduce the number of germs on your skin by washing with CHG (chlorahexidine gluconate) Soap before surgery.  CHG is an antiseptic cleaner which kills germs and bonds with the skin to continue killing germs even after washing.     Please do not use if you have an allergy to CHG or antibacterial soaps. If your skin becomes reddened/irritated stop using the CHG.  Do not shave (including legs and underarms) for at least 48 hours prior to first CHG shower. It is OK to shave your face.  Please follow these instructions carefully.     Shower  the NIGHT BEFORE SURGERY and the MORNING OF SURGERY with CHG Soap.   If you chose to wash your hair, wash your hair first as usual with your normal shampoo. After you shampoo, rinse your hair and body thoroughly to remove the shampoo.  Then [employer] and genitals (private parts) with your normal soap and rinse thoroughly to remove soap.  After that Use CHG Soap as you would any other liquid soap. You can apply CHG directly to the skin and wash gently with a scrungie or a clean washcloth.   Apply the CHG Soap to your body ONLY FROM THE NECK  DOWN.  Do not use on open wounds or open sores. Avoid contact with your eyes, ears, mouth and genitals (private parts). Wash Face and genitals (private parts)  with your normal soap.   Wash thoroughly, paying special attention to the area where your surgery will be performed.  Thoroughly rinse your body with warm water from the neck down.  DO NOT shower/wash with your normal soap after using and rinsing off the CHG Soap.  Pat yourself dry with a CLEAN TOWEL.  Wear CLEAN PAJAMAS to bed the night before surgery  Place CLEAN SHEETS on your bed the night before your surgery  DO NOT SLEEP WITH PETS.   Day of Surgery:  Take a shower with CHG soap. Wear Clean/Comfortable clothing the morning of surgery Do not wear jewelry or makeup. Do not wear lotions, powders, perfumes/cologne or deodorant. Do not shave 48 hours prior to surgery.  Men may shave face and neck. Do not bring valuables to the hospital. Do not wear nail polish, gel polish, artificial nails, or any other type of covering on natural nails (fingers and toes) If you have artificial nails or gel coating that need to be removed by a nail salon, please have this removed prior to surgery. Artificial nails or gel coating may interfere with anesthesia's ability to adequately monitor your vital signs.  Remember to brush your teeth WITH YOUR REGULAR TOOTHPASTE.    If you received a COVID test during your pre-op visit, it is requested that you wear a mask when out in public, stay away from anyone that may not be feeling well, and notify your surgeon if you develop symptoms. If you have been in contact with anyone that has tested positive in the last 10 days, please notify your surgeon.    Please read over the following fact sheets that you were given.

## 2022-08-07 ENCOUNTER — Telehealth: Payer: Self-pay

## 2022-08-07 ENCOUNTER — Encounter (HOSPITAL_COMMUNITY): Payer: Self-pay

## 2022-08-07 ENCOUNTER — Other Ambulatory Visit: Payer: Self-pay

## 2022-08-07 ENCOUNTER — Encounter (HOSPITAL_COMMUNITY)
Admission: RE | Admit: 2022-08-07 | Discharge: 2022-08-07 | Disposition: A | Discharge status: Home / Self Care | Payer: Medicare Other | Source: Ambulatory Visit | Attending: Orthopaedic Surgery | Admitting: Orthopaedic Surgery

## 2022-08-07 VITALS — BP 125/87 | HR 74 | Temp 97.6°F | Resp 18 | Ht 63.0 in | Wt 145.6 lb

## 2022-08-07 DIAGNOSIS — Z01818 Encounter for other preprocedural examination: Secondary | ICD-10-CM | POA: Diagnosis not present

## 2022-08-07 DIAGNOSIS — M1612 Unilateral primary osteoarthritis, left hip: Secondary | ICD-10-CM | POA: Diagnosis not present

## 2022-08-07 LAB — COMPREHENSIVE METABOLIC PANEL
ALT: 16 U/L (ref 0–44)
AST: 21 U/L (ref 15–41)
Albumin: 3.7 g/dL (ref 3.5–5.0)
Alkaline Phosphatase: 57 U/L (ref 38–126)
Anion gap: 6 (ref 5–15)
BUN: 18 mg/dL (ref 8–23)
CO2: 29 mmol/L (ref 22–32)
Calcium: 9.5 mg/dL (ref 8.9–10.3)
Chloride: 106 mmol/L (ref 98–111)
Creatinine, Ser: 0.86 mg/dL (ref 0.44–1.00)
GFR, Estimated: >60 mL/min (ref >60)
Glucose, Bld: 127 mg/dL — ABNORMAL HIGH (ref 70–99)
Potassium: 4.4 mmol/L (ref 3.5–5.1)
Sodium: 141 mmol/L (ref 135–145)
Total Bilirubin: 0.3 mg/dL (ref 0.3–1.2)
Total Protein: 6 g/dL — ABNORMAL LOW (ref 6.5–8.1)

## 2022-08-07 LAB — CBC
HCT: 36.6 % (ref 36.0–46.0)
Hemoglobin: 12.3 g/dL (ref 12.0–15.0)
MCH: 35.9 pg — ABNORMAL HIGH (ref 26.0–34.0)
MCHC: 33.6 g/dL (ref 30.0–36.0)
MCV: 106.7 fL — ABNORMAL HIGH (ref 80.0–100.0)
Platelets: 295 10*3/uL (ref 150–400)
RBC: 3.43 MIL/uL — ABNORMAL LOW (ref 3.87–5.11)
RDW: 12.6 % (ref 11.5–15.5)
WBC: 8.3 10*3/uL (ref 4.0–10.5)
nRBC: 0 % (ref 0.0–0.2)

## 2022-08-07 LAB — TYPE AND SCREEN
ABO/RH(D): A POS
Antibody Screen: NEGATIVE

## 2022-08-07 LAB — SURGICAL PCR SCREEN
MRSA, PCR: NEGATIVE
Staphylococcus aureus: POSITIVE — AB

## 2022-08-07 NOTE — Telephone Encounter (Signed)
Patient calling stating she has a dentist appt tomorrow and wondering if she can go or does she need to postpone?  Her THA is scheduled for October 30

## 2022-08-07 NOTE — Progress Notes (Signed)
PCP - Dr. Scarlette Calico Cardiologist - denies  PPM/ICD - denies   Chest x-ray - 11/03/12 EKG - 01/04/17 Stress Test - 11/21/2005 ECHO - denies Cardiac Cath - denies  Sleep Study - denies   DM- denies  Last dose of GLP1 agonist-  n/a   ASA/Blood Thinner Instructions: n/a   ERAS Protcol - yes PRE-SURGERY Ensure given at PAT  COVID TEST- n/a   Anesthesia review: no  Patient denies shortness of breath, fever, cough and chest pain at PAT appointment   All instructions explained to the patient, with a verbal understanding of the material. Patient agrees to go over the instructions while at home for a better understanding. The opportunity to ask questions was provided.

## 2022-08-07 NOTE — Telephone Encounter (Signed)
Called and [facility] for patient. No dental cleaning/procedures before or after within 6 weeks of surgery.

## 2022-08-13 ENCOUNTER — Other Ambulatory Visit: Payer: Self-pay | Admitting: Physician Assistant

## 2022-08-13 MED ORDER — DOCUSATE SODIUM 100 MG PO CAPS: 100.0000 mg | ORAL_CAPSULE | Freq: Every day | ORAL | 2 refills | Status: DC | PRN

## 2022-08-13 MED ORDER — ASPIRIN 81 MG PO TBEC: 81.0000 mg | DELAYED_RELEASE_TABLET | Freq: Two times a day (BID) | ORAL | 0 refills | Status: AC

## 2022-08-13 MED ORDER — METHOCARBAMOL 500 MG PO TABS: 500.0000 mg | ORAL_TABLET | Freq: Two times a day (BID) | ORAL | 2 refills | Status: DC | PRN

## 2022-08-13 MED ORDER — OXYCODONE-ACETAMINOPHEN 5-325 MG PO TABS: 1.0000 | ORAL_TABLET | Freq: Four times a day (QID) | ORAL | 0 refills | Status: DC | PRN

## 2022-08-13 MED ORDER — ONDANSETRON HCL 4 MG PO TABS: 4.0000 mg | ORAL_TABLET | Freq: Three times a day (TID) | ORAL | 0 refills | Status: DC | PRN

## 2022-08-17 MED ORDER — TRANEXAMIC ACID 1000 MG/10ML IV SOLN
2000.0000 mg | INTRAVENOUS | Status: DC
  Filled 2022-08-17: qty 20

## 2022-08-19 NOTE — Anesthesia Preprocedure Evaluation (Signed)
Anesthesia Evaluation  Patient identified by MRN, date of birth, ID band Patient awake    Reviewed: Allergy & Precautions, NPO status , Patient's Chart, lab work & pertinent test results  Airway Mallampati: II  TM Distance: >3 FB Neck ROM: Full    Dental  (+) Dental Advisory Given, Teeth Intact, Implants, Chipped,    Pulmonary Current Smoker and Patient abstained from smoking.   Pulmonary exam normal breath sounds clear to auscultation       Cardiovascular negative cardio ROS Normal cardiovascular exam Rhythm:Regular Rate:Normal     Neuro/Psych negative neurological ROS     GI/Hepatic Neg liver ROS,GERD  ,,  Endo/Other  negative endocrine ROS    Renal/GU negative Renal ROS     Musculoskeletal  (+) Arthritis ,    Abdominal   Peds  Hematology negative hematology ROS (+)   Anesthesia Other Findings   Reproductive/Obstetrics                             Anesthesia Physical Anesthesia Plan  ASA: 2  Anesthesia Plan: Spinal   Post-op Pain Management: Tylenol PO (pre-op)*   Induction: Intravenous  PONV Risk Score and Plan: 2 and Ondansetron, Dexamethasone, Propofol infusion, TIVA and Treatment may vary due to age or medical condition  Airway Management Planned:   Additional Equipment:   Intra-op Plan:   Post-operative Plan:   Informed Consent: I have reviewed the patients History and Physical, chart, labs and discussed the procedure including the risks, benefits and alternatives for the proposed anesthesia with the patient or authorized representative who has indicated his/her understanding and acceptance.     Dental advisory given  Plan Discussed with: CRNA  Anesthesia Plan Comments:         Anesthesia Quick Evaluation

## 2022-08-20 ENCOUNTER — Encounter (HOSPITAL_COMMUNITY): Payer: Self-pay | Admitting: Orthopaedic Surgery

## 2022-08-20 ENCOUNTER — Ambulatory Visit (HOSPITAL_BASED_OUTPATIENT_CLINIC_OR_DEPARTMENT_OTHER): Payer: Medicare Other | Admitting: Anesthesiology

## 2022-08-20 ENCOUNTER — Observation Stay (HOSPITAL_COMMUNITY)
Admission: RE | Admit: 2022-08-20 | Discharge: 2022-08-21 | Disposition: A | Discharge status: Home / Self Care | Payer: Medicare Other | Attending: Orthopaedic Surgery | Admitting: Orthopaedic Surgery

## 2022-08-20 ENCOUNTER — Other Ambulatory Visit: Payer: Self-pay

## 2022-08-20 ENCOUNTER — Observation Stay (HOSPITAL_COMMUNITY): Payer: Medicare Other

## 2022-08-20 ENCOUNTER — Encounter (HOSPITAL_COMMUNITY)
Admission: RE | Disposition: A | Discharge status: Home / Self Care | Payer: Self-pay | Source: Home / Self Care | Attending: Orthopaedic Surgery

## 2022-08-20 ENCOUNTER — Ambulatory Visit (HOSPITAL_COMMUNITY): Payer: Medicare Other

## 2022-08-20 ENCOUNTER — Ambulatory Visit (HOSPITAL_COMMUNITY): Payer: Medicare Other | Admitting: Anesthesiology

## 2022-08-20 DIAGNOSIS — M1612 Unilateral primary osteoarthritis, left hip: Secondary | ICD-10-CM

## 2022-08-20 DIAGNOSIS — M24152 Other articular cartilage disorders, left hip: Secondary | ICD-10-CM | POA: Diagnosis not present

## 2022-08-20 DIAGNOSIS — Z96642 Presence of left artificial hip joint: Secondary | ICD-10-CM

## 2022-08-20 DIAGNOSIS — Z471 Aftercare following joint replacement surgery: Secondary | ICD-10-CM | POA: Diagnosis not present

## 2022-08-20 DIAGNOSIS — F17210 Nicotine dependence, cigarettes, uncomplicated: Secondary | ICD-10-CM | POA: Diagnosis not present

## 2022-08-20 HISTORY — PX: TOTAL HIP ARTHROPLASTY: SHX124

## 2022-08-20 LAB — ABO/RH: ABO/RH(D): A POS

## 2022-08-20 SURGERY — ARTHROPLASTY, HIP, TOTAL, ANTERIOR APPROACH
Anesthesia: Spinal | Site: Hip | Laterality: Left

## 2022-08-20 MED ORDER — ONDANSETRON HCL 4 MG PO TABS: 4.0000 mg | ORAL_TABLET | Freq: Four times a day (QID) | ORAL | Status: DC | PRN

## 2022-08-20 MED ORDER — VANCOMYCIN HCL 1000 MG IV SOLR
INTRAVENOUS | Status: AC
  Filled 2022-08-20: qty 20

## 2022-08-20 MED ORDER — DOCUSATE SODIUM 100 MG PO CAPS
100.0000 mg | ORAL_CAPSULE | Freq: Two times a day (BID) | ORAL | Status: DC
  Administered 2022-08-20 – 2022-08-21 (×3): 100 mg via ORAL
  Filled 2022-08-20 (×3): qty 1

## 2022-08-20 MED ORDER — PANTOPRAZOLE SODIUM 40 MG PO TBEC
40.0000 mg | DELAYED_RELEASE_TABLET | Freq: Every day | ORAL | Status: DC
  Administered 2022-08-20 – 2022-08-21 (×2): 40 mg via ORAL
  Filled 2022-08-20 (×2): qty 1

## 2022-08-20 MED ORDER — METOCLOPRAMIDE HCL 5 MG PO TABS: 5.0000 mg | ORAL_TABLET | Freq: Three times a day (TID) | ORAL | Status: DC | PRN

## 2022-08-20 MED ORDER — ALUM & MAG HYDROXIDE-SIMETH 200-200-20 MG/5ML PO SUSP: 30.0000 mL | ORAL | Status: DC | PRN

## 2022-08-20 MED ORDER — POVIDONE-IODINE 10 % EX SWAB
2.0000 | Freq: Once | CUTANEOUS | Status: AC
  Administered 2022-08-20: 2 via TOPICAL

## 2022-08-20 MED ORDER — TRANEXAMIC ACID-NACL 1000-0.7 MG/100ML-% IV SOLN
1000.0000 mg | Freq: Once | INTRAVENOUS | Status: AC
  Administered 2022-08-20: 1000 mg via INTRAVENOUS
  Filled 2022-08-20: qty 100

## 2022-08-20 MED ORDER — PHENYLEPHRINE HCL-NACL 20-0.9 MG/250ML-% IV SOLN
INTRAVENOUS | Status: DC | PRN
  Administered 2022-08-20: 30 ug/min via INTRAVENOUS

## 2022-08-20 MED ORDER — BUPIVACAINE-MELOXICAM ER 400-12 MG/14ML IJ SOLN
INTRAMUSCULAR | Status: DC | PRN
  Administered 2022-08-20: 400 mg

## 2022-08-20 MED ORDER — OXYCODONE HCL 5 MG PO TABS
5.0000 mg | ORAL_TABLET | ORAL | Status: DC | PRN
  Administered 2022-08-20 – 2022-08-21 (×2): 10 mg via ORAL
  Filled 2022-08-20 (×2): qty 2

## 2022-08-20 MED ORDER — CEFAZOLIN SODIUM-DEXTROSE 2-4 GM/100ML-% IV SOLN
2.0000 g | INTRAVENOUS | Status: AC
  Administered 2022-08-20: 2 g via INTRAVENOUS
  Filled 2022-08-20: qty 100

## 2022-08-20 MED ORDER — TRANEXAMIC ACID-NACL 1000-0.7 MG/100ML-% IV SOLN
INTRAVENOUS | Status: AC
  Filled 2022-08-20: qty 100

## 2022-08-20 MED ORDER — CHLORHEXIDINE GLUCONATE 0.12 % MT SOLN
15.0000 mL | Freq: Once | OROMUCOSAL | Status: AC
  Administered 2022-08-20: 15 mL via OROMUCOSAL
  Filled 2022-08-20: qty 15

## 2022-08-20 MED ORDER — ONDANSETRON HCL 4 MG/2ML IJ SOLN
INTRAMUSCULAR | Status: DC | PRN
  Administered 2022-08-20: 4 mg via INTRAVENOUS

## 2022-08-20 MED ORDER — DEXAMETHASONE SODIUM PHOSPHATE 10 MG/ML IJ SOLN
INTRAMUSCULAR | Status: DC | PRN
  Administered 2022-08-20: 4 mg via INTRAVENOUS

## 2022-08-20 MED ORDER — HYDROMORPHONE HCL 1 MG/ML IJ SOLN: 0.5000 mg | INTRAMUSCULAR | Status: DC | PRN

## 2022-08-20 MED ORDER — ORAL CARE MOUTH RINSE: 15.0000 mL | Freq: Once | OROMUCOSAL | Status: AC

## 2022-08-20 MED ORDER — ASPIRIN 81 MG PO CHEW
81.0000 mg | CHEWABLE_TABLET | Freq: Two times a day (BID) | ORAL | Status: DC
  Administered 2022-08-20 – 2022-08-21 (×2): 81 mg via ORAL
  Filled 2022-08-20 (×2): qty 1

## 2022-08-20 MED ORDER — ACETAMINOPHEN 325 MG PO TABS: 325.0000 mg | ORAL_TABLET | Freq: Four times a day (QID) | ORAL | Status: DC | PRN

## 2022-08-20 MED ORDER — PROPOFOL 500 MG/50ML IV EMUL
INTRAVENOUS | Status: DC | PRN
  Administered 2022-08-20: 50 ug/kg/min via INTRAVENOUS

## 2022-08-20 MED ORDER — TRANEXAMIC ACID-NACL 1000-0.7 MG/100ML-% IV SOLN
1000.0000 mg | INTRAVENOUS | Status: AC
  Administered 2022-08-20: 1000 mg via INTRAVENOUS
  Filled 2022-08-20: qty 100

## 2022-08-20 MED ORDER — DIPHENHYDRAMINE HCL 12.5 MG/5ML PO ELIX: 25.0000 mg | ORAL_SOLUTION | ORAL | Status: DC | PRN

## 2022-08-20 MED ORDER — SODIUM CHLORIDE 0.9 % IV SOLN: INTRAVENOUS | Status: DC

## 2022-08-20 MED ORDER — 0.9 % SODIUM CHLORIDE (POUR BTL) OPTIME
TOPICAL | Status: DC | PRN
  Administered 2022-08-20: 1000 mL

## 2022-08-20 MED ORDER — METHOCARBAMOL 1000 MG/10ML IJ SOLN: 500.0000 mg | Freq: Four times a day (QID) | INTRAVENOUS | Status: DC | PRN

## 2022-08-20 MED ORDER — DEXAMETHASONE SODIUM PHOSPHATE 10 MG/ML IJ SOLN
10.0000 mg | Freq: Once | INTRAMUSCULAR | Status: AC
  Administered 2022-08-21: 10 mg via INTRAVENOUS
  Filled 2022-08-20: qty 1

## 2022-08-20 MED ORDER — IRRISEPT - 450ML BOTTLE WITH 0.05% CHG IN STERILE WATER, USP 99.95% OPTIME
TOPICAL | Status: DC | PRN
  Administered 2022-08-20: 450 mL

## 2022-08-20 MED ORDER — AMISULPRIDE (ANTIEMETIC) 5 MG/2ML IV SOLN: 10.0000 mg | Freq: Once | INTRAVENOUS | Status: DC | PRN

## 2022-08-20 MED ORDER — METOCLOPRAMIDE HCL 5 MG/ML IJ SOLN: 5.0000 mg | Freq: Three times a day (TID) | INTRAMUSCULAR | Status: DC | PRN

## 2022-08-20 MED ORDER — ACETAMINOPHEN 500 MG PO TABS
1000.0000 mg | ORAL_TABLET | Freq: Four times a day (QID) | ORAL | Status: AC
  Administered 2022-08-20 – 2022-08-21 (×4): 1000 mg via ORAL
  Filled 2022-08-20 (×4): qty 2

## 2022-08-20 MED ORDER — MENTHOL 3 MG MT LOZG: 1.0000 | LOZENGE | OROMUCOSAL | Status: DC | PRN

## 2022-08-20 MED ORDER — PHENOL 1.4 % MT LIQD: 1.0000 | OROMUCOSAL | Status: DC | PRN

## 2022-08-20 MED ORDER — OXYCODONE HCL ER 10 MG PO T12A
10.0000 mg | EXTENDED_RELEASE_TABLET | Freq: Two times a day (BID) | ORAL | Status: DC
  Administered 2022-08-20 – 2022-08-21 (×3): 10 mg via ORAL
  Filled 2022-08-20 (×3): qty 1

## 2022-08-20 MED ORDER — BUPIVACAINE IN DEXTROSE 0.75-8.25 % IT SOLN
INTRATHECAL | Status: DC | PRN
  Administered 2022-08-20: 1.6 mL via INTRATHECAL

## 2022-08-20 MED ORDER — MAGNESIUM CITRATE PO SOLN: 1.0000 | Freq: Once | ORAL | Status: DC | PRN

## 2022-08-20 MED ORDER — ACETAMINOPHEN 500 MG PO TABS
1000.0000 mg | ORAL_TABLET | Freq: Once | ORAL | Status: AC
  Administered 2022-08-20: 1000 mg via ORAL
  Filled 2022-08-20: qty 2

## 2022-08-20 MED ORDER — HYDROMORPHONE HCL 1 MG/ML IJ SOLN: 0.2500 mg | INTRAMUSCULAR | Status: DC | PRN

## 2022-08-20 MED ORDER — FERROUS SULFATE 325 (65 FE) MG PO TABS
325.0000 mg | ORAL_TABLET | Freq: Every day | ORAL | Status: DC
  Administered 2022-08-20 – 2022-08-21 (×2): 325 mg via ORAL
  Filled 2022-08-20 (×2): qty 1

## 2022-08-20 MED ORDER — METHOCARBAMOL 500 MG PO TABS
500.0000 mg | ORAL_TABLET | Freq: Four times a day (QID) | ORAL | Status: DC | PRN
  Administered 2022-08-20 – 2022-08-21 (×2): 500 mg via ORAL
  Filled 2022-08-20 (×3): qty 1

## 2022-08-20 MED ORDER — SODIUM CHLORIDE 0.9 % IR SOLN
Status: DC | PRN
  Administered 2022-08-20: 1000 mL

## 2022-08-20 MED ORDER — LACTATED RINGERS IV SOLN
INTRAVENOUS | Status: DC
Start: 2022-08-20 — End: 2022-08-20

## 2022-08-20 MED ORDER — LACTATED RINGERS IV SOLN: INTRAVENOUS | Status: DC

## 2022-08-20 MED ORDER — PROPOFOL 10 MG/ML IV BOLUS
INTRAVENOUS | Status: DC | PRN
  Administered 2022-08-20: 30 mg via INTRAVENOUS
  Administered 2022-08-20: 20 mg via INTRAVENOUS

## 2022-08-20 MED ORDER — PROPOFOL 10 MG/ML IV BOLUS
INTRAVENOUS | Status: AC
  Filled 2022-08-20: qty 20

## 2022-08-20 MED ORDER — OXYCODONE HCL 5 MG PO TABS
10.0000 mg | ORAL_TABLET | ORAL | Status: DC | PRN
  Administered 2022-08-20: 10 mg via ORAL
  Filled 2022-08-20: qty 2

## 2022-08-20 MED ORDER — POLYETHYLENE GLYCOL 3350 17 G PO PACK
17.0000 g | PACK | Freq: Every day | ORAL | Status: DC
  Administered 2022-08-20 – 2022-08-21 (×2): 17 g via ORAL
  Filled 2022-08-20 (×2): qty 1

## 2022-08-20 MED ORDER — SORBITOL 70 % SOLN: 30.0000 mL | Freq: Every day | Status: DC | PRN

## 2022-08-20 MED ORDER — BUPIVACAINE-MELOXICAM ER 400-12 MG/14ML IJ SOLN
INTRAMUSCULAR | Status: AC
  Filled 2022-08-20: qty 1

## 2022-08-20 MED ORDER — CEFAZOLIN SODIUM-DEXTROSE 2-4 GM/100ML-% IV SOLN
2.0000 g | Freq: Four times a day (QID) | INTRAVENOUS | Status: AC
  Administered 2022-08-20 (×2): 2 g via INTRAVENOUS
  Filled 2022-08-20 (×2): qty 100

## 2022-08-20 MED ORDER — ONDANSETRON HCL 4 MG/2ML IJ SOLN: 4.0000 mg | Freq: Four times a day (QID) | INTRAMUSCULAR | Status: DC | PRN

## 2022-08-20 SURGICAL SUPPLY — 69 items
ADH SKN CLS LQ APL DERMABOND (GAUZE/BANDAGES/DRESSINGS) ×1
BAG COUNTER SPONGE SURGICOUNT (BAG) ×2 IMPLANT
BAG DECANTER FOR FLEXI CONT (MISCELLANEOUS) ×2 IMPLANT
BAG SPNG CNTER NS LX DISP (BAG) ×1
BLADE SAW SAG 90X13X1.27 (BLADE) IMPLANT
COOLER ICEMAN CLASSIC (MISCELLANEOUS) IMPLANT
COVER PERINEAL POST (MISCELLANEOUS) ×1 IMPLANT
COVER SURGICAL LIGHT HANDLE (MISCELLANEOUS) ×1 IMPLANT
CUP ACET PINNACLE SECTR 48MM (Joint) IMPLANT
DERMABOND ADVANCED .7 DNX6 (GAUZE/BANDAGES/DRESSINGS) IMPLANT
DRAPE C-ARM 42X72 X-RAY (DRAPES) ×1 IMPLANT
DRAPE POUCH INSTRU U-SHP 10X18 (DRAPES) ×1 IMPLANT
DRAPE STERI IOBAN 125X83 (DRAPES) ×1 IMPLANT
DRAPE U-SHAPE 47X51 STRL (DRAPES) ×4 IMPLANT
DRSG AQUACEL AG ADV 3.5X 6 (GAUZE/BANDAGES/DRESSINGS) IMPLANT
DRSG AQUACEL AG ADV 3.5X10 (GAUZE/BANDAGES/DRESSINGS) ×2 IMPLANT
DURAPREP 26ML APPLICATOR (WOUND CARE) ×2 IMPLANT
ELECT BLADE 4.0 EZ CLEAN MEGAD (MISCELLANEOUS) ×1
ELECT REM PT RETURN 9FT ADLT (ELECTROSURGICAL) ×1
ELECTRODE BLDE 4.0 EZ CLN MEGD (MISCELLANEOUS) ×2 IMPLANT
ELECTRODE REM PT RTRN 9FT ADLT (ELECTROSURGICAL) ×1 IMPLANT
FEM STEM 12/14 TAPER SZ 4 HIP (Orthopedic Implant) ×1 IMPLANT
FEMORAL STEM 12/14 TPR SZ4 HIP (Orthopedic Implant) IMPLANT
GLOVE BIOGEL PI IND STRL 7.0 (GLOVE) ×2 IMPLANT
GLOVE BIOGEL PI IND STRL 7.5 (GLOVE) ×5 IMPLANT
GLOVE ECLIPSE 7.0 STRL STRAW (GLOVE) ×4 IMPLANT
GLOVE SKINSENSE STRL SZ7.5 (GLOVE) ×2 IMPLANT
GLOVE SURG SYN 7.5  E (GLOVE) ×2
GLOVE SURG SYN 7.5 E (GLOVE) ×2 IMPLANT
GLOVE SURG SYN 7.5 PF PI (GLOVE) ×4 IMPLANT
GLOVE SURG UNDER POLY LF SZ7 (GLOVE) ×2 IMPLANT
GLOVE SURG UNDER POLY LF SZ7.5 (GLOVE) ×2 IMPLANT
GOWN STRL REIN XL XLG (GOWN DISPOSABLE) ×1 IMPLANT
GOWN STRL REUS W/ TWL LRG LVL3 (GOWN DISPOSABLE) IMPLANT
GOWN STRL REUS W/ TWL XL LVL3 (GOWN DISPOSABLE) ×1 IMPLANT
GOWN STRL REUS W/TWL LRG LVL3 (GOWN DISPOSABLE) ×1
GOWN STRL REUS W/TWL XL LVL3 (GOWN DISPOSABLE) ×1
HANDPIECE INTERPULSE COAX TIP (DISPOSABLE) ×1
HEAD FEM STD 32X+9 STRL (Hips) IMPLANT
HOOD PEEL AWAY FLYTE STAYCOOL (MISCELLANEOUS) ×2 IMPLANT
IV NS IRRIG 3000ML ARTHROMATIC (IV SOLUTION) ×1 IMPLANT
JET LAVAGE IRRISEPT WOUND (IRRIGATION / IRRIGATOR) ×1
KIT BASIN OR (CUSTOM PROCEDURE TRAY) ×1 IMPLANT
LAVAGE JET IRRISEPT WOUND (IRRIGATION / IRRIGATOR) ×1 IMPLANT
MARKER SKIN DUAL TIP RULER LAB (MISCELLANEOUS) ×2 IMPLANT
NDL SPNL 18GX3.5 QUINCKE PK (NEEDLE) ×2 IMPLANT
NEEDLE SPNL 18GX3.5 QUINCKE PK (NEEDLE) ×1 IMPLANT
PACK TOTAL JOINT (CUSTOM PROCEDURE TRAY) ×1 IMPLANT
PACK UNIVERSAL I (CUSTOM PROCEDURE TRAY) ×2 IMPLANT
PAD COLD SHLDR WRAP-ON (PAD) IMPLANT
PINN ALTRX NEUT ID X OD 32X48 IMPLANT
PINNSECTOR W/GRIP ACE CUP 48MM (Joint) ×1 IMPLANT
SAW OSC TIP CART 19.5X105X1.3 (SAW) ×2 IMPLANT
SCREW 6.5MMX25MM (Screw) IMPLANT
SET HNDPC FAN SPRY TIP SCT (DISPOSABLE) ×2 IMPLANT
STAPLER VISISTAT 35W (STAPLE) IMPLANT
SUT ETHIBOND 2 V 37 (SUTURE) ×2 IMPLANT
SUT VIC AB 0 CT1 27 (SUTURE) ×1
SUT VIC AB 0 CT1 27XBRD ANBCTR (SUTURE) ×2 IMPLANT
SUT VIC AB 1 CTX 36 (SUTURE) ×1
SUT VIC AB 1 CTX36XBRD ANBCTR (SUTURE) ×1 IMPLANT
SUT VIC AB 2-0 CT1 27 (SUTURE) ×2
SUT VIC AB 2-0 CT1 TAPERPNT 27 (SUTURE) ×4 IMPLANT
SYR 50ML LL SCALE MARK (SYRINGE) ×2 IMPLANT
TOWEL GREEN STERILE (TOWEL DISPOSABLE) ×1 IMPLANT
TRAY CATH 16FR W/PLASTIC CATH (SET/KITS/TRAYS/PACK) IMPLANT
TRAY FOLEY W/BAG SLVR 16FR (SET/KITS/TRAYS/PACK) ×1
TRAY FOLEY W/BAG SLVR 16FR ST (SET/KITS/TRAYS/PACK) ×1 IMPLANT
YANKAUER SUCT BULB TIP NO VENT (SUCTIONS) ×1 IMPLANT

## 2022-08-20 NOTE — H&P (Signed)
PREOPERATIVE H&P  Chief Complaint: left hip degenerative joint disease  HPI: Sharon Robinson is a 76 y.o. female who presents for surgical treatment of left hip degenerative joint disease.  She denies any changes in medical history.  Past Medical History:  Diagnosis Date   Degenerative disc disease, lumbar 08/13/2017   Xray on 08/12/17   DI (detrusor instability)    GERD (gastroesophageal reflux disease)    H/O vitamin D deficiency    Hyperlipidemia    Osteopenia    Sinusitis    related to smoking   STD (sexually transmitted disease)    HSV 2   Past Surgical History:  Procedure Laterality Date   CATARACT EXTRACTION Bilateral 2016   OTHER SURGICAL HISTORY     T&A age 62   SHOULDER INJECTION Left 11/2018   Dr. Onnie Graham   TONSILLECTOMY     TUBAL LIGATION  1989   Social History   Socioeconomic History   Marital status: Single    Spouse name: Not on file   Number of children: 0   Years of education: Not on file   Highest education level: Not on file  Occupational History   Occupation: retired  Tobacco Use   Smoking status: Every Day    Packs/day: 0.25    Types: Cigarettes   Smokeless tobacco: Never  Vaping Use   Vaping Use: Never used  Substance and Sexual Activity   Alcohol use: Not Currently   Drug use: No   Sexual activity: Not Currently    Birth control/protection: Surgical, Post-menopausal    Comment: BTL,des neg, older than 74, more than 5  Other Topics Concern   Not on file  Social History Narrative   Not on file   Social Determinants of Health   Financial Resource Strain: Low Risk  (12/15/2021)   Overall Financial Resource Strain (CARDIA)    Difficulty of Paying Living Expenses: Not hard at all  Food Insecurity: No Food Insecurity (12/15/2021)   Hunger Vital Sign    Worried About Running Out of Food in the Last Year: Never true    [place] in the Last Year: Never true  Transportation Needs: No Transportation Needs (12/15/2021)   PRAPARE -  Hydrologist (Medical): No    Lack of Transportation (Non-Medical): No  Physical Activity: Insufficiently Active (12/15/2021)   Exercise Vital Sign    Days of Exercise per Week: 3 days    Minutes of Exercise per Session: 30 min  Stress: No Stress Concern Present (12/15/2021)   [place]    Feeling of Stress : Not at all  Social Connections: Moderately Integrated (12/15/2021)   Social Connection and Isolation Panel [NHANES]    Frequency of Communication with Friends and Family: Twice a week    Frequency of Social Gatherings with Friends and Family: Twice a week    Attends Religious Services: More than 4 times per year    Active Member of [employer] or Organizations: Yes    Attends Music therapist: More than 4 times per year    Marital Status: Never married   Family History  Problem Relation Age of Onset   Heart attack Mother 18   Cancer Father 17       colon   No Known Allergies Prior to Admission medications   Medication Sig Start Date End Date Taking? Authorizing Provider  alendronate (FOSAMAX) 70 MG tablet Take 1  tablet (70 mg total) by mouth once a week. Take with a full glass of water on an empty stomach. 12/29/21  Yes Princess Bruins, MD  aspirin EC 81 MG tablet Take 1 tablet (81 mg total) by mouth 2 (two) times daily. To be taken after surgery to prevent blood clots 08/13/22 08/13/23  Aundra Dubin, PA-C  atorvastatin (LIPITOR) 40 MG tablet Take 1 tablet (40 mg total) by mouth daily. 05/30/22  Yes Janith Lima, MD  cetirizine (ZYRTEC ALLERGY) 10 MG tablet Take 1 tablet (10 mg total) by mouth daily. 12/25/16  Yes Nche, Charlene Brooke, NP  Cholecalciferol (VITAMIN D) 50 MCG (2000 UT) tablet Take 2,000 Units by mouth daily.   Yes [provider]  docusate sodium (COLACE) 100 MG capsule Take 1 capsule (100 mg total) by mouth daily as needed. 08/13/22 08/13/23  Aundra Dubin, PA-C  methocarbamol (ROBAXIN) 500 MG tablet Take 1 tablet (500 mg total) by mouth 2 (two) times daily as needed. 08/13/22   Aundra Dubin, PA-C  naproxen sodium (ALEVE) 220 MG tablet Take 220 mg by mouth daily as needed (pain).   Yes [provider]  ondansetron (ZOFRAN) 4 MG tablet Take 1 tablet (4 mg total) by mouth every 8 (eight) hours as needed for nausea or vomiting. 08/13/22   Aundra Dubin, PA-C  oxyCODONE-acetaminophen (PERCOCET) 5-325 MG tablet Take 1-2 tablets by mouth every 6 (six) hours as needed. 08/13/22   Aundra Dubin, PA-C  valACYclovir (VALTREX) 500 MG tablet Take 1 tablet (500 mg total) by mouth daily. Patient taking differently: Take 500 mg by mouth at bedtime. 12/29/21  Yes Princess Bruins, MD     Positive ROS: All other systems have been reviewed and were otherwise negative with the exception of those mentioned in the HPI and as above.  Physical Exam: General: Alert, no acute distress Cardiovascular: No pedal edema Respiratory: No cyanosis, no use of accessory musculature GI: abdomen soft Skin: No lesions in the area of chief complaint Neurologic: Sensation intact distally Psychiatric: Patient is competent for consent with normal mood and affect Lymphatic: no lymphedema  MUSCULOSKELETAL: exam stable  Assessment: left hip degenerative joint disease  Plan: Plan for Procedure(s): LEFT TOTAL HIP ARTHROPLASTY-ANTERIOR APPROACH  The risks benefits and alternatives were discussed with the patient including but not limited to the risks of nonoperative treatment, versus surgical intervention including infection, bleeding, nerve injury,  blood clots, cardiopulmonary complications, morbidity, mortality, among others, and they were willing to proceed.   Eduard Roux, MD 08/20/2022 6:06 AM

## 2022-08-20 NOTE — Transfer of Care (Signed)
Immediate Anesthesia Transfer of Care Note  Patient: SHAYLA HEMING  Procedure(s) Performed: LEFT TOTAL HIP ARTHROPLASTY-ANTERIOR APPROACH (Left: Hip)  Patient Location: PACU  Anesthesia Type:Spinal  Level of Consciousness: drowsy and patient cooperative  Airway & Oxygen Therapy: Patient Spontanous Breathing  Post-op Assessment: Report given to RN, Post -op Vital signs reviewed and stable, and Patient moving all extremities X 4  Post vital signs: Reviewed and stable  Last Vitals:  Vitals Value Taken Time  BP 99/60   Temp    Pulse 56 08/20/22 0933  Resp 17 08/20/22 0933  SpO2 97 % 08/20/22 0933  Vitals shown include unvalidated device data.  Last Pain:  Vitals:   08/20/22 0624  TempSrc:   PainSc: 0-No pain         Complications: No notable events documented.

## 2022-08-20 NOTE — Discharge Instructions (Signed)

## 2022-08-20 NOTE — Op Note (Signed)
LEFT TOTAL HIP ARTHROPLASTY-ANTERIOR APPROACH  Procedure Note Sharon Robinson   [id number]  Pre-op Diagnosis: left hip degenerative joint disease     Post-op Diagnosis: same  Operative Findings Complete loss of articular cartilage   Operative Procedures  1. Total hip replacement; Left hip; uncemented [id number]   Surgeon: Frankey Shown, M.D.  Assist: Madalyn Rob, PA-C   Anesthesia: spinal  Prosthesis: Depuy Acetabulum: Pinnacle 48 mm Femur: Actis 4 HO Head: 32 mm size: +9 Liner: +4 Bearing Type: metal/poly  Total Hip Arthroplasty (Anterior Approach) Op Note:  After informed consent was obtained and the operative extremity marked in the holding area, the patient was brought back to the operating room and placed supine on the HANA table. Next, the operative extremity was prepped and draped in normal sterile fashion. Surgical timeout occurred verifying patient identification, surgical site, surgical procedure and administration of antibiotics.  A bikini incision was made in line with Langer's lines.  A Hueter approach to the hip was performed, using the interval between tensor fascia lata and sartorius.  Dissection was carried bluntly down onto the anterior hip capsule. The lateral femoral circumflex vessels were identified and coagulated. A capsulotomy was performed and the capsular flaps tagged for later repair.  The neck osteotomy was performed. The femoral head was removed which showed severe wear, the acetabular rim was cleared of soft tissue and attention was turned to reaming the acetabulum.  Sequential reaming was performed under fluoroscopic guidance. We reamed to a size 47 mm, and then impacted the acetabular shell. A 25 mm cancellous screw was placed through the shell for added fixation.  The liner was then placed after irrigation and attention turned to the femur.  After placing the femoral hook, the leg was taken to externally rotated, extended and adducted position  taking care to perform soft tissue releases to allow for adequate mobilization of the femur. Soft tissue was cleared from the shoulder of the greater trochanter and the hook elevator used to improve exposure of the proximal femur. Sequential broaching performed up to a size 4. Various trial necks and head balls were used.  I found that a high offset neck and a +9 head ball was the most stable even though it did increase offset slightly compared to contralateral hip.   Although, a standard neck and +5 head ball matched her contralateral anatomy most closely, the hip was unstable when it was extended to 20 degrees and externally rotated to 90 degrees.  The cup was positioned within the acceptable parameters therefore I did not feel that I needed to reposition the cup. The leg was brought back up to neutral and the construct reduced.  Antibiotic irrigation was placed in the surgical wound.  The position and sizing of components, offset and leg lengths were checked using fluoroscopy. Stability of the construct was checked in extension and external rotation without any subluxation or impingement of prosthesis. We dislocated the prosthesis, dropped the leg back into position, removed trial components, and irrigated copiously. The final stem and head was then placed, the leg brought back up, the system reduced and fluoroscopy used to verify positioning.  We irrigated, obtained hemostasis and closed the capsule using #2 ethibond suture.  One gram of vancomycin powder was placed in the surgical bed.   One gram of topical tranexamic acid was injected into the joint.  The fascia was closed with #1 vicryl plus, the deep fat layer was closed with 0 vicryl, the subcutaneous layers closed with  2.0 Vicryl Plus and the skin closed with 2.0 nylon and dermabond. A sterile dressing was applied. The patient was awakened in the operating room and taken to recovery in stable condition.  All sponge, needle, and instrument counts were  correct at the end of the case.   Tawanna Cooler, my PA, was a medical necessity for opening, closing, limb positioning, retracting, exposing, and overall facilitation and timely completion of the surgery.  Position: supine  Complications: see description of procedure.  Time Out: performed   Drains/Packing: none  Estimated blood loss: see anesthesia record  Returned to Recovery Room: in good condition.   Antibiotics: yes   Mechanical VTE (DVT) Prophylaxis: sequential compression devices, TED thigh-high  Chemical VTE (DVT) Prophylaxis: aspirin   Fluid Replacement: see anesthesia record  Specimens Removed: 1 to pathology   Sponge and Instrument Count Correct? yes   PACU: portable radiograph - low AP   Plan/RTC: Return in 2 weeks for staple removal. Weight Bearing/Load Lower Extremity: full  Hip precautions: none Suture Removal: 2 weeks   N. Eduard Roux, MD [facility] 8:56 AM   Implant Name Type Inv. Item Serial No. Manufacturer Lot No. LRB No. Used Action  PINNSECTOR W/GRIP ACE CUP 48MM - [id number] Joint PINNSECTOR W/GRIP ACE CUP 48MM  [employer] [id number] Left 1 Implanted  PINN ALTRX NEUT ID X OD 32X48 - [id number]  PINN ALTRX NEUT ID X OD 32X48  [employer] [id number] Left 1 Implanted  SCREW 6.5MMX25MM - [id number] Screw SCREW 6.5MMX25MM  [employer] [id number] Left 1 Implanted  FEM STEM 12/14 TAPER SZ 4 HIP - [id number] Orthopedic Implant FEM STEM 12/14 TAPER SZ 4 HIP  [employer] [id number] Left 1 Implanted  HEAD FEM STD 32X+9 STRL - [id number] Hips HEAD FEM STD 32X+9 STRL  [employer] [id number] Left 1 Implanted

## 2022-08-20 NOTE — Anesthesia Procedure Notes (Signed)
Procedure Name: MAC Date/Time: 08/20/2022 7:43 AM  Performed by: Darletta Moll, CRNAPre-anesthesia Checklist: Patient identified, Emergency Drugs available, Suction available and Patient being monitored Patient Re-evaluated:Patient Re-evaluated prior to induction Oxygen Delivery Method: Nasal cannula

## 2022-08-20 NOTE — Anesthesia Procedure Notes (Signed)
Spinal  Patient location during procedure: OR Start time: 08/20/2022 7:23 AM End time: 08/20/2022 7:28 AM Reason for block: surgical anesthesia Staffing Performed: anesthesiologist  Anesthesiologist: Nolon Nations, MD Performed by: Nolon Nations, MD Authorized by: Nolon Nations, MD   Preanesthetic Checklist Completed: patient identified, IV checked, site marked, risks and benefits discussed, surgical consent, monitors and equipment checked, pre-op evaluation and timeout performed Spinal Block Patient position: sitting Prep: DuraPrep and site prepped and draped Patient monitoring: heart rate, continuous pulse ox and blood pressure Approach: left paramedian Location: L3-4 Injection technique: single-shot Needle Needle type: Spinocan  Needle gauge: 25 G Needle length: 9 cm Additional Notes Expiration date of kit checked and confirmed. Patient tolerated procedure well, without complications.

## 2022-08-20 NOTE — Evaluation (Signed)
Physical Therapy Evaluation Patient Details Name: Sharon Robinson MRN: [id number] DOB: 1946-01-17 Today's Date: 08/20/2022  History of Present Illness  Pt is a 76 y.o. F who presents s/p L THA 08/20/2022. Significant PMH: none.  Clinical Impression  Pt admitted s/p L THA. Pt reports she plans to stay with her brother and sister in law until Friday. Pt presents with decreased functional mobility secondary to LLE pain, weakness, and gait abnormalities. Pt ambulating ~8 ft with a walker at a min guard assist level. Pt reporting dizziness/nausea, so further mobility deferred. Suspect good progress given pain control and management. Will address further gait and stair training prior to d/c home.     Recommendations for follow up therapy are one component of a multi-disciplinary discharge planning process, led by the attending physician.  Recommendations may be updated based on patient status, additional functional criteria and insurance authorization.  Follow Up Recommendations Follow physician's recommendations for discharge plan and follow up therapies      Assistance Recommended at Discharge PRN  Patient can return home with the following  A little help with walking and/or transfers;A little help with bathing/dressing/bathroom;Assistance with cooking/housework;Assist for transportation;Help with stairs or ramp for entrance    Equipment Recommendations None recommended by PT (pt has needed DME)  Recommendations for Other Services       Functional Status Assessment Patient has had a recent decline in their functional status and demonstrates the ability to make significant improvements in function in a reasonable and predictable amount of time.     Precautions / Restrictions Precautions Precautions: Fall Restrictions Weight Bearing Restrictions: No      Mobility  Bed Mobility Overal bed mobility: Needs Assistance Bed Mobility: Supine to Sit     Supine to sit: Min assist      General bed mobility comments: MinA for LLE negotiation off of bed    Transfers Overall transfer level: Needs assistance Equipment used: Rolling walker (2 wheels) Transfers: Sit to/from Stand Sit to Stand: Min guard           General transfer comment: Increased time to rise    Ambulation/Gait Ambulation/Gait assistance: Min guard Gait Distance (Feet): 8 Feet Assistive device: Rolling walker (2 wheels) Gait Pattern/deviations: Step-to pattern, Decreased stance time - left, Decreased dorsiflexion - left, Decreased weight shift to left, Antalgic, Decreased step length - right Gait velocity: decreased Gait velocity interpretation: <1.8 ft/sec, indicate of risk for recurrent falls   General Gait Details: Cues for sequencing/technique, intermittent assist for steering RW. Pt reporting dizziness/nausea, so chair brought up behind her  Stairs            Wheelchair Mobility    Modified Rankin (Stroke Patients Only)       Balance Overall balance assessment: Needs assistance Sitting-balance support: Feet supported Sitting balance-Leahy Scale: Good     Standing balance support: Bilateral upper extremity supported Standing balance-Leahy Scale: Poor Standing balance comment: reliant on RW                             Pertinent Vitals/Pain Pain Assessment Pain Assessment: Faces Faces Pain Scale: Hurts even more Pain Location: L hip Pain Descriptors / Indicators: Grimacing, Operative site guarding Pain Intervention(s): Limited activity within patient's tolerance, Monitored during session, RN gave pain meds during session, Ice applied    Home Living Family/patient expects to be discharged to:: Private residence Living Arrangements: Alone Available Help at Discharge: Family Type of Home: House  Home Access: Stairs to enter Entrance Stairs-Rails: Right;Left Entrance Stairs-Number of Steps: 2-4 (2 from back entrance at her house, 4 at brother's house)    Home Layout: One level Home Equipment: Conservation officer, nature (2 wheels);Cane - single point Additional Comments: Plans to d/c home with her brother until Friday    Prior Function Prior Level of Function : Independent/Modified Independent                     Hand Dominance        Extremity/Trunk Assessment   Upper Extremity Assessment Upper Extremity Assessment: Overall WFL for tasks assessed    Lower Extremity Assessment Lower Extremity Assessment: LLE deficits/detail LLE Deficits / Details: s/p THA. Grossly 3-/5    Cervical / Trunk Assessment Cervical / Trunk Assessment: Normal  Communication   Communication: No difficulties  Cognition Arousal/Alertness: Awake/alert Behavior During Therapy: WFL for tasks assessed/performed Overall Cognitive Status: Within Functional Limits for tasks assessed                                          General Comments      Exercises Total Joint Exercises Ankle Circles/Pumps: Both, 20 reps, Supine Quad Sets: 10 reps, Supine, Both Long Arc Quad: Left, 5 reps, Seated   Assessment/Plan    PT Assessment Patient needs continued PT services  PT Problem List Decreased strength;Decreased activity tolerance;Decreased balance;Decreased mobility;Pain       PT Treatment Interventions DME instruction;Gait training;Stair training;Functional mobility training;Balance training;Therapeutic activities;Therapeutic exercise;Patient/family education    PT Goals (Current goals can be found in the Care Plan section)  Acute Rehab PT Goals Patient Stated Goal: less pain PT Goal Formulation: With patient Time For Goal Achievement: 09/03/22 Potential to Achieve Goals: Good    Frequency 7X/week     Co-evaluation               AM-PAC PT "6 Clicks" Mobility  Outcome Measure Help needed turning from your back to your side while in a flat bed without using bedrails?: A Little Help needed moving from lying on your back to  sitting on the side of a flat bed without using bedrails?: A Little Help needed moving to and from a bed to a chair (including a wheelchair)?: A Little Help needed standing up from a chair using your arms (e.g., wheelchair or bedside chair)?: A Little Help needed to walk in hospital room?: A Little Help needed climbing 3-5 steps with a railing? : A Lot 6 Click Score: 17    End of Session Equipment Utilized During Treatment: Gait belt Activity Tolerance: Patient tolerated treatment well Patient left: in chair;with call bell/phone within reach Nurse Communication: Mobility status PT Visit Diagnosis: Pain;Difficulty in walking, not elsewhere classified (R26.2) Pain - Right/Left: Left Pain - part of body: Hip    Time: 9147-8295 PT Time Calculation (min) (ACUTE ONLY): 23 min   Charges:   PT Evaluation $PT Eval Low Complexity: 1 Low PT Treatments $Therapeutic Activity: 8-22 mins        Wyona Almas, PT, DPT Acute Rehabilitation Services Office [phone]   Deno Etienne 08/20/2022, 3:03 PM

## 2022-08-20 NOTE — Anesthesia Postprocedure Evaluation (Signed)
Anesthesia Post Note  Patient: Sharon Robinson  Procedure(s) Performed: LEFT TOTAL HIP ARTHROPLASTY-ANTERIOR APPROACH (Left: Hip)     Patient location during evaluation: PACU Anesthesia Type: Spinal Level of consciousness: awake and alert Pain management: pain level controlled Vital Signs Assessment: post-procedure vital signs reviewed and stable Respiratory status: spontaneous breathing Cardiovascular status: stable Anesthetic complications: no   No notable events documented.  Last Vitals:  Vitals:   08/20/22 1015 08/20/22 1042  BP: (!) 112/57 117/83  Pulse: (!) 56 61  Resp: 17 16  Temp: (!) 36.1 C 36.6 C  SpO2: 96% 100%    Last Pain:  Vitals:   08/20/22 1335  TempSrc:   PainSc: [place]

## 2022-08-21 ENCOUNTER — Telehealth: Payer: Self-pay | Admitting: *Deleted

## 2022-08-21 DIAGNOSIS — F17210 Nicotine dependence, cigarettes, uncomplicated: Secondary | ICD-10-CM | POA: Diagnosis not present

## 2022-08-21 DIAGNOSIS — M1612 Unilateral primary osteoarthritis, left hip: Secondary | ICD-10-CM | POA: Diagnosis not present

## 2022-08-21 LAB — CBC
HCT: 30.5 % — ABNORMAL LOW (ref 36.0–46.0)
Hemoglobin: 10.4 g/dL — ABNORMAL LOW (ref 12.0–15.0)
MCH: 35.6 pg — ABNORMAL HIGH (ref 26.0–34.0)
MCHC: 34.1 g/dL (ref 30.0–36.0)
MCV: 104.5 fL — ABNORMAL HIGH (ref 80.0–100.0)
Platelets: 230 10*3/uL (ref 150–400)
RBC: 2.92 MIL/uL — ABNORMAL LOW (ref 3.87–5.11)
RDW: 12.2 % (ref 11.5–15.5)
WBC: 12.1 10*3/uL — ABNORMAL HIGH (ref 4.0–10.5)
nRBC: 0 % (ref 0.0–0.2)

## 2022-08-21 NOTE — Progress Notes (Signed)
Subjective: 1 Day Post-Op Procedure(s) (LRB): LEFT TOTAL HIP ARTHROPLASTY-ANTERIOR APPROACH (Left) Patient reports pain as moderate.  Had issues with pain control overnight, but doing better this am.   Objective: Vital signs in last 24 hours: Temp:  [97 F (36.1 C)-99.6 F (37.6 C)] 99.6 F (37.6 C) (10/31 0356) Pulse Rate:  [53-92] 74 (10/31 0356) Resp:  [14-18] 18 (10/31 0356) BP: (83-134)/(56-83) 130/58 (10/31 0356) SpO2:  [94 %-100 %] 100 % (10/31 0356)  Intake/Output from previous day: 10/30 0701 - 10/31 0700 In: 1100 [I.V.:1000; IV Piggyback:100] Out: 1350 [Urine:1200; Blood:150] Intake/Output this shift: No intake/output data recorded.  Recent Labs    08/21/22 0525  HGB 10.4*   Recent Labs    08/21/22 0525  WBC 12.1*  RBC 2.92*  HCT 30.5*  PLT 230   No results for input(s): "NA", "K", "CL", "CO2", "BUN", "CREATININE", "GLUCOSE", "CALCIUM" in the last 72 hours. No results for input(s): "LABPT", "INR" in the last 72 hours.  Neurologically intact Neurovascular intact Sensation intact distally Intact pulses distally Dorsiflexion/Plantar flexion intact Incision: scant drainage No cellulitis present Compartment soft   Assessment/Plan: 1 Day Post-Op Procedure(s) (LRB): LEFT TOTAL HIP ARTHROPLASTY-ANTERIOR APPROACH (Left) Advance diet Up with therapy D/C IV fluids D/c home once cleared by PT AND pain is under control WBAT LLE ABLA- mild and stable     Aundra Dubin 08/21/2022, 7:07 AM

## 2022-08-21 NOTE — Telephone Encounter (Signed)
(  Late entry for Pre-op call on 08/16/22). Ortho bundle pre-op call completed.

## 2022-08-21 NOTE — Plan of Care (Signed)
  Problem: Education: Goal: Knowledge of the prescribed therapeutic regimen will improve Outcome: Completed/Met Goal: Understanding of discharge needs will improve Outcome: Completed/Met Goal: Individualized Educational Video(s) Outcome: Completed/Met   Problem: Activity: Goal: Ability to avoid complications of mobility impairment will improve Outcome: Completed/Met Goal: Ability to tolerate increased activity will improve Outcome: Completed/Met   Problem: Clinical Measurements: Goal: Postoperative complications will be avoided or minimized Outcome: Completed/Met   Problem: Pain Management: Goal: Pain level will decrease with appropriate interventions Outcome: Completed/Met   Problem: Skin Integrity: Goal: Will show signs of wound healing Outcome: Completed/Met   Problem: Education: Goal: Knowledge of General Education information will improve Description: Including pain rating scale, medication(s)/side effects and non-pharmacologic comfort measures Outcome: Completed/Met   Problem: Health Behavior/Discharge Planning: Goal: Ability to manage health-related needs will improve Outcome: Completed/Met

## 2022-08-21 NOTE — Progress Notes (Signed)
Physical Therapy Treatment Patient Details Name: Sharon Robinson MRN: [id number] DOB: 1945-12-24 Today's Date: 08/21/2022   History of Present Illness Pt is a 76 y.o. F who presents s/p L THA 08/20/2022. Significant PMH: none.    PT Comments    Patient progressing well towards PT goals. Session focused on gait training, bed mobility and there ex. Pt reports continued pain in left hip which is controlled by medication. Improved ambulation distance today with cues for a more normalized gait pattern and proper RW use. Instructed pt in there ex. Will plan for stair training this afternoon and will provide HEP handout for home.     Recommendations for follow up therapy are one component of a multi-disciplinary discharge planning process, led by the attending physician.  Recommendations may be updated based on patient status, additional functional criteria and insurance authorization.  Follow Up Recommendations  Follow physician's recommendations for discharge plan and follow up therapies     Assistance Recommended at Discharge PRN  Patient can return home with the following A little help with walking and/or transfers;A little help with bathing/dressing/bathroom;Assistance with cooking/housework;Assist for transportation;Help with stairs or ramp for entrance   Equipment Recommendations  None recommended by PT    Recommendations for Other Services       Precautions / Restrictions Precautions Precautions: Fall Restrictions Weight Bearing Restrictions: Yes LLE Weight Bearing: Weight bearing as tolerated     Mobility  Bed Mobility Overal bed mobility: Needs Assistance Bed Mobility: Supine to Sit     Supine to sit: Mod assist, HOB elevated     General bed mobility comments: Use of sheet to assist with bringing LLE to EOB, assist with trunk- cues for technique and sequencing with HOB flat and no use of rails. Increased time.    Transfers Overall transfer level: Needs  assistance Equipment used: Rolling walker (2 wheels) Transfers: Sit to/from Stand Sit to Stand: Min guard           General transfer comment: Min guard for safety. Stood from [employer], from chair x1. Cues for hand placement/technique.    Ambulation/Gait Ambulation/Gait assistance: Min guard Gait Distance (Feet): 100 Feet Assistive device: Rolling walker (2 wheels) Gait Pattern/deviations: Step-to pattern, Decreased stance time - left, Decreased weight shift to left, Antalgic, Decreased step length - right, Step-through pattern Gait velocity: decreased Gait velocity interpretation: <1.8 ft/sec, indicate of risk for recurrent falls   General Gait Details: Slow, mildly unsteady gait with cues for sequencing and to roll RW and not pick it up.   Stairs             Wheelchair Mobility    Modified Rankin (Stroke Patients Only)       Balance Overall balance assessment: Needs assistance Sitting-balance support: Feet supported, No upper extremity supported Sitting balance-Leahy Scale: Good     Standing balance support: During functional activity Standing balance-Leahy Scale: Poor Standing balance comment: reliant on RW                            Cognition Arousal/Alertness: Awake/alert Behavior During Therapy: WFL for tasks assessed/performed Overall Cognitive Status: Within Functional Limits for tasks assessed                                          Exercises Total Joint Exercises Ankle Circles/Pumps: AROM, Both, 10 reps,  Supine Quad Sets: AROM, Both, 10 reps, Supine Hip ABduction/ADduction: AROM, Both, 10 reps, Seated Long Arc Quad: Left, Seated, 10 reps    General Comments General comments (skin integrity, edema, etc.): bandage- clean, dry and intact      Pertinent Vitals/Pain Pain Assessment Pain Assessment: Faces Faces Pain Scale: Hurts even more Pain Location: L hip Pain Descriptors / Indicators: Grimacing, Operative site  guarding Pain Intervention(s): Monitored during session, Repositioned    Home Living                          Prior Function            PT Goals (current goals can now be found in the care plan section) Progress towards PT goals: Progressing toward goals    Frequency    7X/week      PT Plan Current plan remains appropriate    Co-evaluation              AM-PAC PT "6 Clicks" Mobility   Outcome Measure  Help needed turning from your back to your side while in a flat bed without using bedrails?: A Little Help needed moving from lying on your back to sitting on the side of a flat bed without using bedrails?: A Lot Help needed moving to and from a bed to a chair (including a wheelchair)?: A Little Help needed standing up from a chair using your arms (e.g., wheelchair or bedside chair)?: A Little Help needed to walk in hospital room?: A Little Help needed climbing 3-5 steps with a railing? : A Lot 6 Click Score: 16    End of Session Equipment Utilized During Treatment: Gait belt Activity Tolerance: Patient tolerated treatment well Patient left: in chair;with call bell/phone within reach Nurse Communication: Mobility status PT Visit Diagnosis: Pain;Difficulty in walking, not elsewhere classified (R26.2) Pain - Right/Left: Left Pain - part of body: Hip     Time: 3734-2876 PT Time Calculation (min) (ACUTE ONLY): 21 min  Charges:  $Gait Training: 8-22 mins                     Marisa Severin, PT, DPT Acute Rehabilitation Services Secure chat preferred Office [place] 08/21/2022, 9:59 AM

## 2022-08-21 NOTE — Progress Notes (Signed)
Physical Therapy Treatment Patient Details Name: Sharon Robinson MRN: [id number] DOB: 02/23/1946 Today's Date: 08/21/2022   History of Present Illness Pt is a 76 y.o. F who presents s/p L THA 08/20/2022. Significant PMH: none.    PT Comments    Patient progressing well this afternoon. Session focused on stair training as well as HEP handout. Continues to have some difficulty managing LLE for bed mobility. Requires MIn A for stair training with HHA but will have 2 rails to ascend her sister in [place] home. Recommend family assist with stairs initially. Provided HEP handout and reviewed standing exercises. Pt plans to d/c home with support of family. Will follow if still in the hospital.    Recommendations for follow up therapy are one component of a multi-disciplinary discharge planning process, led by the attending physician.  Recommendations may be updated based on patient status, additional functional criteria and insurance authorization.  Follow Up Recommendations  Follow physician's recommendations for discharge plan and follow up therapies     Assistance Recommended at Discharge PRN  Patient can return home with the following A little help with walking and/or transfers;A little help with bathing/dressing/bathroom;Assistance with cooking/housework;Assist for transportation;Help with stairs or ramp for entrance   Equipment Recommendations  None recommended by PT    Recommendations for Other Services       Precautions / Restrictions Precautions Precautions: Fall Restrictions Weight Bearing Restrictions: Yes LLE Weight Bearing: Weight bearing as tolerated     Mobility  Bed Mobility Overal bed mobility: Needs Assistance Bed Mobility: Supine to Sit, Sit to Supine     Supine to sit: Min guard, HOB elevated Sit to supine: Min assist, HOB elevated   General bed mobility comments: Increased time to get to EOB with difficulty manuevering LLE. Assist to bring LLE into bed.     Transfers Overall transfer level: Needs assistance Equipment used: Rolling walker (2 wheels) Transfers: Sit to/from Stand Sit to Stand: Min guard           General transfer comment: Min guard for safety. Stood from [employer] for hand placement/technique.    Ambulation/Gait Ambulation/Gait assistance: Min guard Gait Distance (Feet): 50 Feet Assistive device: Rolling walker (2 wheels) Gait Pattern/deviations: Step-to pattern, Decreased stance time - left, Decreased weight shift to left, Antalgic, Decreased step length - right, Step-through pattern Gait velocity: decreased Gait velocity interpretation: <1.8 ft/sec, indicate of risk for recurrent falls   General Gait Details: Slow, mildly unsteady gait with cues for sequencing and to roll RW and not pick it up. Able to walk backwards into bathroom without LOB.   Stairs Stairs: Yes Stairs assistance: Min assist Stair Management: Step to pattern, One rail Left Number of Stairs: 4 General stair comments: HHA on 1 side and rail on other side, Min A to ascend. CUes for technique and safety.   Wheelchair Mobility    Modified Rankin (Stroke Patients Only)       Balance Overall balance assessment: Needs assistance Sitting-balance support: Feet supported, No upper extremity supported Sitting balance-Leahy Scale: Good     Standing balance support: During functional activity Standing balance-Leahy Scale: Poor Standing balance comment: reliant on RW                            Cognition Arousal/Alertness: Awake/alert Behavior During Therapy: WFL for tasks assessed/performed Overall Cognitive Status: Within Functional Limits for tasks assessed  Exercises Total Joint Exercises Ankle Circles/Pumps: AROM, Both, 10 reps, Supine Quad Sets: AROM, Both, 10 reps, Supine Hip ABduction/ADduction: AROM, Both, 10 reps, Seated Long Arc Quad: Left, Seated, 10  reps Marching in Standing: AROM, Both, 5 reps, Standing Standing Hip Extension: AROM, Both, 5 reps, Standing Other Exercises Other Exercises: standing hip abduction x5 in RW BLEs Other Exercises: Standing hamcurls x5 BLEs    General Comments General comments (skin integrity, edema, etc.): bandage- clean, dry and intact      Pertinent Vitals/Pain Pain Assessment Pain Assessment: Faces Faces Pain Scale: Hurts little more Pain Location: L hip Pain Descriptors / Indicators: Grimacing, Operative site guarding Pain Intervention(s): Monitored during session, Repositioned, Premedicated before session    Home Living                          Prior Function            PT Goals (current goals can now be found in the care plan section) Progress towards PT goals: Progressing toward goals    Frequency    7X/week      PT Plan Current plan remains appropriate    Co-evaluation              AM-PAC PT "6 Clicks" Mobility   Outcome Measure  Help needed turning from your back to your side while in a flat bed without using bedrails?: A Little Help needed moving from lying on your back to sitting on the side of a flat bed without using bedrails?: A Little Help needed moving to and from a bed to a chair (including a wheelchair)?: A Little Help needed standing up from a chair using your arms (e.g., wheelchair or bedside chair)?: A Little Help needed to walk in hospital room?: A Little Help needed climbing 3-5 steps with a railing? : A Little 6 Click Score: 18    End of Session Equipment Utilized During Treatment: Gait belt Activity Tolerance: Patient tolerated treatment well Patient left: in bed;with call bell/phone within reach Nurse Communication: Mobility status PT Visit Diagnosis: Pain;Difficulty in walking, not elsewhere classified (R26.2);Muscle weakness (generalized) (M62.81) Pain - Right/Left: Left Pain - part of body: Hip     Time: 3007-6226 PT Time  Calculation (min) (ACUTE ONLY): 27 min  Charges:  $Gait Training: 8-22 mins $Therapeutic Exercise: 8-22 mins                     Marisa Severin, PT, DPT Acute Rehabilitation Services Secure chat preferred Office [place] 08/21/2022, 11:13 AM

## 2022-08-21 NOTE — Discharge Summary (Signed)
Patient ID: Sharon Robinson MRN: [id number] DOB/AGE: 78-Nov-1947 89 y.o.  Admit date: 08/20/2022 Discharge date: 08/21/2022  Admission Diagnoses:  Principal Problem:   Primary osteoarthritis of left hip Active Problems:   Status post total replacement of left hip   Discharge Diagnoses:  Same  Past Medical History:  Diagnosis Date   Degenerative disc disease, lumbar 08/13/2017   Xray on 08/12/17   DI (detrusor instability)    GERD (gastroesophageal reflux disease)    H/O vitamin D deficiency    Hyperlipidemia    Osteopenia    Sinusitis    related to smoking   STD (sexually transmitted disease)    HSV 2    Surgeries: Procedure(s): LEFT TOTAL HIP ARTHROPLASTY-ANTERIOR APPROACH on 08/20/2022   Consultants:   Discharged Condition: Improved  Hospital Course: Sharon Robinson is an 76 y.o. female who was admitted 08/20/2022 for operative treatment ofPrimary osteoarthritis of left hip. Patient has severe unremitting pain that affects sleep, daily activities, and work/hobbies. After pre-op clearance the patient was taken to the operating room on 08/20/2022 and underwent  Procedure(s): LEFT TOTAL HIP [place].    Patient was given perioperative antibiotics:  Anti-infectives (From admission, onward)    Start     Dose/Rate Route Frequency Ordered Stop   08/20/22 1400  ceFAZolin (ANCEF) IVPB 2g/100 mL premix        2 g 200 mL/hr over 30 Minutes Intravenous Every 6 hours 08/20/22 1022 08/20/22 2004   08/20/22 0600  ceFAZolin (ANCEF) IVPB 2g/100 mL premix        2 g 200 mL/hr over 30 Minutes Intravenous On call to O.R. 08/20/22 0535 08/20/22 0756        Patient was given sequential compression devices, early ambulation, and chemoprophylaxis to prevent DVT.  Patient benefited maximally from hospital stay and there were no complications.    Recent vital signs: Patient Vitals for the past 24 hrs:  BP Temp Temp src Pulse Resp SpO2  08/21/22 0356 (!) 130/58  99.6 F (37.6 C) Oral 74 18 100 %  08/20/22 2316 119/63 98.4 F (36.9 C) Oral 70 18 100 %  08/20/22 2003 99/61 (!) 97.5 F (36.4 C) Oral 65 18 99 %  08/20/22 1728 134/74 97.6 F (36.4 C) -- 92 16 100 %  08/20/22 1042 117/83 97.8 F (36.6 C) Oral 61 16 100 %  08/20/22 1015 (!) 112/57 (!) 97 F (36.1 C) -- (!) 56 17 96 %  08/20/22 1000 (!) 109/56 -- -- (!) 56 14 94 %  08/20/22 0951 109/71 -- -- (!) 56 17 100 %  08/20/22 0945 (!) 83/60 -- -- (!) 53 16 100 %  08/20/22 0933 99/60 (!) 97 F (36.1 C) -- (!) 56 17 97 %     Recent laboratory studies:  Recent Labs    08/21/22 0525  WBC 12.1*  HGB 10.4*  HCT 30.5*  PLT 230     Discharge Medications:   Allergies as of 08/21/2022   No Known Allergies      Medication List     STOP taking these medications    naproxen sodium 220 MG tablet Commonly known as: ALEVE       TAKE these medications    alendronate 70 MG tablet Commonly known as: FOSAMAX Take 1 tablet (70 mg total) by mouth once a week. Take with a full glass of water on an empty stomach.   aspirin EC 81 MG tablet Take 1 tablet (81 mg total) by mouth  2 (two) times daily. To be taken after surgery to prevent blood clots   atorvastatin 40 MG tablet Commonly known as: LIPITOR Take 1 tablet (40 mg total) by mouth daily.   cetirizine 10 MG tablet Commonly known as: ZyrTEC Allergy Take 1 tablet (10 mg total) by mouth daily.   docusate sodium 100 MG capsule Commonly known as: Colace Take 1 capsule (100 mg total) by mouth daily as needed.   methocarbamol 500 MG tablet Commonly known as: ROBAXIN Take 1 tablet (500 mg total) by mouth 2 (two) times daily as needed.   ondansetron 4 MG tablet Commonly known as: Zofran Take 1 tablet (4 mg total) by mouth every 8 (eight) hours as needed for nausea or vomiting.   oxyCODONE-acetaminophen 5-325 MG tablet Commonly known as: Percocet Take 1-2 tablets by mouth every 6 (six) hours as needed.   valACYclovir 500 MG  tablet Commonly known as: VALTREX Take 1 tablet (500 mg total) by mouth daily. What changed: when to take this   Vitamin D 50 MCG (2000 UT) tablet Take 2,000 Units by mouth daily.               Durable Medical Equipment  (From admission, onward)           Start     Ordered   08/20/22 1047  DME Walker rolling  Once       Question:  Patient needs a walker to treat with the following condition  Answer:  History of hip replacement   08/20/22 1046   08/20/22 1047  DME 3 n 1  Once        08/20/22 1046   08/20/22 1047  DME Bedside commode  Once       Question:  Patient needs a bedside commode to treat with the following condition  Answer:  History of hip replacement   08/20/22 1046            Diagnostic Studies: DG Pelvis Portable  Result Date: 08/20/2022 CLINICAL DATA:  Status post left hip replacement. EXAM: PORTABLE PELVIS 1-2 VIEWS COMPARISON:  Fluoroscopic images of the same day. FINDINGS: The left femoral and acetabular components are well situated. Expected postoperative changes are seen in the surrounding soft tissues. IMPRESSION: Status post left total hip arthroplasty. Electronically Signed   By: Marijo Conception M.D.   On: 08/20/2022 09:44   DG HIP UNILAT WITH PELVIS 1V LEFT  Result Date: 08/20/2022 CLINICAL DATA:  Total left hip arthroplasty anterior approach. Intraoperative fluoroscopy. EXAM: DG HIP (WITH OR WITHOUT PELVIS) 1V*L* COMPARISON:  Left hip radiographs 07/23/2022 FINDINGS: Images were performed intraoperatively without the presence of a radiologist. Interval total left hip arthroplasty. No hardware complication is seen. Total fluoroscopy images: 4 Total fluoroscopy time: 31 seconds Total dose: Radiation Exposure Index (as provided by the fluoroscopic device): 2.988 mGy air Kerma Please see intraoperative findings for further detail. IMPRESSION: Intraoperative fluoroscopy provided during total left hip arthroplasty. Electronically Signed   By: Yvonne Kendall M.D.   On: 08/20/2022 09:30   DG C-Arm 1-60 Min-No Report  Result Date: 08/20/2022 Fluoroscopy was utilized by the requesting physician.  No radiographic interpretation.   XR HIP UNILAT W OR W/O PELVIS 2-3 VIEWS LEFT  Result Date: 07/24/2022 Advanced degenerative joint disease with bone-on-bone joint space narrowing.   Disposition: Discharge disposition: 01-Home or Self Care          Follow-up Information     Leandrew Koyanagi, MD. Schedule an appointment as  soon as possible for a visit in 2 week(s).   Specialty: Orthopedic Surgery Contact information: [place] [address] [phone]         [facility], [place] Follow up.   Specialty: Home Health Services Why: The home health agency will call you for the first home visit. Contact information: [address] [place] [address] [phone]                  Signed: Aundra Dubin 08/21/2022, 7:08 AM

## 2022-08-21 NOTE — Care Plan (Signed)
(  Late entry for 08/16/22). [facility] RNCM call to patient prior to surgery to discuss her upcoming Left total hip arthroplasty with Dr. Erlinda Hong on Monday, 08/20/22. She is an Ortho bundle patient through [facility] and is agreeable to case management. She will be going to her brother's and sister in [place] home after discharge. She has a RW as well as a toilet riser per patient. Anticipate HHPT will be needed after short hospital stay. Referral made to [facility] after choice provided. Reviewed all post op care instructions. Will continue to follow for needs.

## 2022-08-21 NOTE — Progress Notes (Signed)
Patient alert and oriented, VSS, surgical site clean and dry no sign of infection. D/c instructions explain and given to the patient all questions answered. Pt d/c home per order.

## 2022-08-22 ENCOUNTER — Telehealth: Payer: Self-pay | Admitting: *Deleted

## 2022-08-22 ENCOUNTER — Encounter (HOSPITAL_COMMUNITY): Payer: Self-pay | Admitting: Orthopaedic Surgery

## 2022-08-22 DIAGNOSIS — F17210 Nicotine dependence, cigarettes, uncomplicated: Secondary | ICD-10-CM | POA: Diagnosis not present

## 2022-08-22 DIAGNOSIS — Z9181 History of falling: Secondary | ICD-10-CM | POA: Diagnosis not present

## 2022-08-22 DIAGNOSIS — J329 Chronic sinusitis, unspecified: Secondary | ICD-10-CM | POA: Diagnosis not present

## 2022-08-22 DIAGNOSIS — E559 Vitamin D deficiency, unspecified: Secondary | ICD-10-CM | POA: Diagnosis not present

## 2022-08-22 DIAGNOSIS — M8580 Other specified disorders of bone density and structure, unspecified site: Secondary | ICD-10-CM | POA: Diagnosis not present

## 2022-08-22 DIAGNOSIS — K219 Gastro-esophageal reflux disease without esophagitis: Secondary | ICD-10-CM | POA: Diagnosis not present

## 2022-08-22 DIAGNOSIS — Z7982 Long term (current) use of aspirin: Secondary | ICD-10-CM | POA: Diagnosis not present

## 2022-08-22 DIAGNOSIS — Z96642 Presence of left artificial hip joint: Secondary | ICD-10-CM | POA: Diagnosis not present

## 2022-08-22 DIAGNOSIS — M5136 Other intervertebral disc degeneration, lumbar region: Secondary | ICD-10-CM | POA: Diagnosis not present

## 2022-08-22 DIAGNOSIS — E785 Hyperlipidemia, unspecified: Secondary | ICD-10-CM | POA: Diagnosis not present

## 2022-08-22 DIAGNOSIS — Z471 Aftercare following joint replacement surgery: Secondary | ICD-10-CM | POA: Diagnosis not present

## 2022-08-22 NOTE — Telephone Encounter (Signed)
Ortho bundle D/C call completed. 

## 2022-08-24 ENCOUNTER — Encounter (HOSPITAL_COMMUNITY): Payer: Self-pay | Admitting: Orthopaedic Surgery

## 2022-08-24 DIAGNOSIS — Z9181 History of falling: Secondary | ICD-10-CM | POA: Diagnosis not present

## 2022-08-24 DIAGNOSIS — M8580 Other specified disorders of bone density and structure, unspecified site: Secondary | ICD-10-CM | POA: Diagnosis not present

## 2022-08-24 DIAGNOSIS — F17210 Nicotine dependence, cigarettes, uncomplicated: Secondary | ICD-10-CM | POA: Diagnosis not present

## 2022-08-24 DIAGNOSIS — J329 Chronic sinusitis, unspecified: Secondary | ICD-10-CM | POA: Diagnosis not present

## 2022-08-24 DIAGNOSIS — M5136 Other intervertebral disc degeneration, lumbar region: Secondary | ICD-10-CM | POA: Diagnosis not present

## 2022-08-24 DIAGNOSIS — Z96642 Presence of left artificial hip joint: Secondary | ICD-10-CM | POA: Diagnosis not present

## 2022-08-24 DIAGNOSIS — Z7982 Long term (current) use of aspirin: Secondary | ICD-10-CM | POA: Diagnosis not present

## 2022-08-24 DIAGNOSIS — E785 Hyperlipidemia, unspecified: Secondary | ICD-10-CM | POA: Diagnosis not present

## 2022-08-24 DIAGNOSIS — Z471 Aftercare following joint replacement surgery: Secondary | ICD-10-CM | POA: Diagnosis not present

## 2022-08-24 DIAGNOSIS — E559 Vitamin D deficiency, unspecified: Secondary | ICD-10-CM | POA: Diagnosis not present

## 2022-08-24 DIAGNOSIS — K219 Gastro-esophageal reflux disease without esophagitis: Secondary | ICD-10-CM | POA: Diagnosis not present

## 2022-08-27 ENCOUNTER — Telehealth: Payer: Self-pay | Admitting: *Deleted

## 2022-08-27 DIAGNOSIS — J329 Chronic sinusitis, unspecified: Secondary | ICD-10-CM | POA: Diagnosis not present

## 2022-08-27 DIAGNOSIS — Z96642 Presence of left artificial hip joint: Secondary | ICD-10-CM | POA: Diagnosis not present

## 2022-08-27 DIAGNOSIS — F17210 Nicotine dependence, cigarettes, uncomplicated: Secondary | ICD-10-CM | POA: Diagnosis not present

## 2022-08-27 DIAGNOSIS — Z471 Aftercare following joint replacement surgery: Secondary | ICD-10-CM | POA: Diagnosis not present

## 2022-08-27 DIAGNOSIS — K219 Gastro-esophageal reflux disease without esophagitis: Secondary | ICD-10-CM | POA: Diagnosis not present

## 2022-08-27 DIAGNOSIS — E785 Hyperlipidemia, unspecified: Secondary | ICD-10-CM | POA: Diagnosis not present

## 2022-08-27 DIAGNOSIS — Z7982 Long term (current) use of aspirin: Secondary | ICD-10-CM | POA: Diagnosis not present

## 2022-08-27 DIAGNOSIS — Z9181 History of falling: Secondary | ICD-10-CM | POA: Diagnosis not present

## 2022-08-27 DIAGNOSIS — M8580 Other specified disorders of bone density and structure, unspecified site: Secondary | ICD-10-CM | POA: Diagnosis not present

## 2022-08-27 DIAGNOSIS — M5136 Other intervertebral disc degeneration, lumbar region: Secondary | ICD-10-CM | POA: Diagnosis not present

## 2022-08-27 DIAGNOSIS — E559 Vitamin D deficiency, unspecified: Secondary | ICD-10-CM | POA: Diagnosis not present

## 2022-08-27 NOTE — Telephone Encounter (Signed)
Attempted Ortho bundle 7 day call. No answer and left VM requesting call back.

## 2022-08-29 DIAGNOSIS — Z471 Aftercare following joint replacement surgery: Secondary | ICD-10-CM | POA: Diagnosis not present

## 2022-08-29 DIAGNOSIS — K219 Gastro-esophageal reflux disease without esophagitis: Secondary | ICD-10-CM | POA: Diagnosis not present

## 2022-08-29 DIAGNOSIS — E785 Hyperlipidemia, unspecified: Secondary | ICD-10-CM | POA: Diagnosis not present

## 2022-08-29 DIAGNOSIS — M8580 Other specified disorders of bone density and structure, unspecified site: Secondary | ICD-10-CM | POA: Diagnosis not present

## 2022-08-29 DIAGNOSIS — J329 Chronic sinusitis, unspecified: Secondary | ICD-10-CM | POA: Diagnosis not present

## 2022-08-29 DIAGNOSIS — F17210 Nicotine dependence, cigarettes, uncomplicated: Secondary | ICD-10-CM | POA: Diagnosis not present

## 2022-08-29 DIAGNOSIS — E559 Vitamin D deficiency, unspecified: Secondary | ICD-10-CM | POA: Diagnosis not present

## 2022-08-29 DIAGNOSIS — Z7982 Long term (current) use of aspirin: Secondary | ICD-10-CM | POA: Diagnosis not present

## 2022-08-29 DIAGNOSIS — Z96642 Presence of left artificial hip joint: Secondary | ICD-10-CM | POA: Diagnosis not present

## 2022-08-29 DIAGNOSIS — M5136 Other intervertebral disc degeneration, lumbar region: Secondary | ICD-10-CM | POA: Diagnosis not present

## 2022-08-29 DIAGNOSIS — Z9181 History of falling: Secondary | ICD-10-CM | POA: Diagnosis not present

## 2022-08-30 DIAGNOSIS — K219 Gastro-esophageal reflux disease without esophagitis: Secondary | ICD-10-CM | POA: Diagnosis not present

## 2022-08-30 DIAGNOSIS — J329 Chronic sinusitis, unspecified: Secondary | ICD-10-CM | POA: Diagnosis not present

## 2022-08-30 DIAGNOSIS — M5136 Other intervertebral disc degeneration, lumbar region: Secondary | ICD-10-CM | POA: Diagnosis not present

## 2022-08-30 DIAGNOSIS — Z9181 History of falling: Secondary | ICD-10-CM | POA: Diagnosis not present

## 2022-08-30 DIAGNOSIS — E785 Hyperlipidemia, unspecified: Secondary | ICD-10-CM | POA: Diagnosis not present

## 2022-08-30 DIAGNOSIS — Z7982 Long term (current) use of aspirin: Secondary | ICD-10-CM | POA: Diagnosis not present

## 2022-08-30 DIAGNOSIS — E559 Vitamin D deficiency, unspecified: Secondary | ICD-10-CM | POA: Diagnosis not present

## 2022-08-30 DIAGNOSIS — M8580 Other specified disorders of bone density and structure, unspecified site: Secondary | ICD-10-CM | POA: Diagnosis not present

## 2022-08-30 DIAGNOSIS — F17210 Nicotine dependence, cigarettes, uncomplicated: Secondary | ICD-10-CM | POA: Diagnosis not present

## 2022-08-30 DIAGNOSIS — Z96642 Presence of left artificial hip joint: Secondary | ICD-10-CM | POA: Diagnosis not present

## 2022-08-30 DIAGNOSIS — Z471 Aftercare following joint replacement surgery: Secondary | ICD-10-CM | POA: Diagnosis not present

## 2022-08-31 ENCOUNTER — Telehealth: Payer: Self-pay | Admitting: *Deleted

## 2022-08-31 NOTE — Telephone Encounter (Signed)
Ortho bundle 7 day call re-attempted for 2nd time. F/U next week in office.

## 2022-09-04 ENCOUNTER — Telehealth: Payer: Self-pay | Admitting: *Deleted

## 2022-09-04 ENCOUNTER — Encounter: Payer: Self-pay | Admitting: Orthopaedic Surgery

## 2022-09-04 ENCOUNTER — Ambulatory Visit (INDEPENDENT_AMBULATORY_CARE_PROVIDER_SITE_OTHER): Payer: Medicare Other | Admitting: Physician Assistant

## 2022-09-04 DIAGNOSIS — E785 Hyperlipidemia, unspecified: Secondary | ICD-10-CM | POA: Diagnosis not present

## 2022-09-04 DIAGNOSIS — Z471 Aftercare following joint replacement surgery: Secondary | ICD-10-CM | POA: Diagnosis not present

## 2022-09-04 DIAGNOSIS — Z7982 Long term (current) use of aspirin: Secondary | ICD-10-CM | POA: Diagnosis not present

## 2022-09-04 DIAGNOSIS — E559 Vitamin D deficiency, unspecified: Secondary | ICD-10-CM | POA: Diagnosis not present

## 2022-09-04 DIAGNOSIS — Z9181 History of falling: Secondary | ICD-10-CM | POA: Diagnosis not present

## 2022-09-04 DIAGNOSIS — Z96642 Presence of left artificial hip joint: Secondary | ICD-10-CM | POA: Diagnosis not present

## 2022-09-04 DIAGNOSIS — M5136 Other intervertebral disc degeneration, lumbar region: Secondary | ICD-10-CM | POA: Diagnosis not present

## 2022-09-04 DIAGNOSIS — F17210 Nicotine dependence, cigarettes, uncomplicated: Secondary | ICD-10-CM | POA: Diagnosis not present

## 2022-09-04 DIAGNOSIS — J329 Chronic sinusitis, unspecified: Secondary | ICD-10-CM | POA: Diagnosis not present

## 2022-09-04 DIAGNOSIS — K219 Gastro-esophageal reflux disease without esophagitis: Secondary | ICD-10-CM | POA: Diagnosis not present

## 2022-09-04 DIAGNOSIS — M8580 Other specified disorders of bone density and structure, unspecified site: Secondary | ICD-10-CM | POA: Diagnosis not present

## 2022-09-04 MED ORDER — METHOCARBAMOL 500 MG PO TABS: 500.0000 mg | ORAL_TABLET | Freq: Two times a day (BID) | ORAL | 2 refills | Status: DC | PRN

## 2022-09-04 MED ORDER — TRAMADOL HCL 50 MG PO TABS: 50.0000 mg | ORAL_TABLET | Freq: Three times a day (TID) | ORAL | 2 refills | Status: DC | PRN

## 2022-09-04 NOTE — Telephone Encounter (Signed)
Ortho bundle 14 day in office meeting completed. °

## 2022-09-04 NOTE — Progress Notes (Signed)
Post-Op Visit Note   Patient: Sharon Robinson           Date of Birth: [birth year]           MRN: [id number] Visit Date: 09/04/2022 PCP: Janith Lima, MD   Assessment & Plan:  Chief Complaint:  Chief Complaint  Patient presents with   Left Hip - Follow-up    Left total hip arthroplasty 08/20/2022   Visit Diagnoses:  1. History of total hip replacement, left     Plan: Patient is a pleasant 76 year old female who comes in today 2 weeks status post left total hip replacement 08/20/2022.  She has been doing well.  She has been getting home health physical therapy and is ambulating with a walker.  She has been compliant taking aspirin twice daily for DVT prophylaxis.  She has been taking occasional oxycodone for pain.  Examination of her left hip reveals a well-healed surgical incision with nylon sutures in place.  No evidence of infection or cellulitis.  Calves are soft and nontender.  Today, sutures were removed and Steri-Strips applied.  She will continue with her baby aspirin twice a day for another 4 weeks.  Continue with walking for exercise.  Dental prophylaxis reinforced.  Follow-up in 4 weeks for recheck.  Call with concerns or questions in the meantime.  Follow-Up Instructions: Return in about 4 weeks (around 10/02/2022).   Orders:  No orders of the defined types were placed in this encounter.  Meds ordered this encounter  Medications   traMADol (ULTRAM) 50 MG tablet    Sig: Take 1 tablet (50 mg total) by mouth 3 (three) times daily as needed.    Dispense:  30 tablet    Refill:  2   methocarbamol (ROBAXIN) 500 MG tablet    Sig: Take 1 tablet (500 mg total) by mouth 2 (two) times daily as needed for muscle spasms.    Dispense:  20 tablet    Refill:  2    Imaging: No new imaging  PMFS History: Patient Active Problem List   Diagnosis Date Noted   Status post total replacement of left hip 08/20/2022   Need for prophylactic vaccination and inoculation against  varicella 04/04/2022   Primary osteoarthritis of left hip 04/04/2022   Chronic left hip pain 04/03/2022   Encounter for general adult medical examination with abnormal findings 04/03/2022   Vitamin D deficiency disease 04/03/2022   Degenerative disc disease, lumbar 08/13/2017   OAB (overactive bladder) 04/16/2013   Tobacco abuse 08/26/2012   DI (detrusor instability)    STD (sexually transmitted disease)    Osteopenia    Hypercholesterolemia 08/20/2011   GERD (gastroesophageal reflux disease)    Past Medical History:  Diagnosis Date   Degenerative disc disease, lumbar 08/13/2017   Xray on 08/12/17   DI (detrusor instability)    GERD (gastroesophageal reflux disease)    H/O vitamin D deficiency    Hyperlipidemia    Osteopenia    Sinusitis    related to smoking   STD (sexually transmitted disease)    HSV 2    Family History  Problem Relation Age of Onset   Heart attack Mother 54   Cancer Father 46       colon    Past Surgical History:  Procedure Laterality Date   CATARACT EXTRACTION Bilateral 2016   OTHER SURGICAL HISTORY     T&A age 65   SHOULDER INJECTION Left 11/2018   Dr. Onnie Graham   TONSILLECTOMY  TOTAL HIP ARTHROPLASTY Left 08/20/2022   Procedure: LEFT TOTAL HIP ARTHROPLASTY-ANTERIOR APPROACH;  Surgeon: Leandrew Koyanagi, MD;  Location: [facility];  Service: Orthopedics;  Laterality: Left;   TUBAL LIGATION  1989   Social History   Occupational History   Occupation: retired  Tobacco Use   Smoking status: Every Day    Packs/day: 0.25    Types: Cigarettes   Smokeless tobacco: Never  Vaping Use   Vaping Use: Never used  Substance and Sexual Activity   Alcohol use: Not Currently   Drug use: No   Sexual activity: Not Currently    Birth control/protection: Surgical, Post-menopausal    Comment: BTL,des neg, older than 16, more than 5

## 2022-09-06 ENCOUNTER — Other Ambulatory Visit: Payer: Self-pay | Admitting: Internal Medicine

## 2022-09-06 DIAGNOSIS — E7800 Pure hypercholesterolemia, unspecified: Secondary | ICD-10-CM

## 2022-09-20 ENCOUNTER — Ambulatory Visit (INDEPENDENT_AMBULATORY_CARE_PROVIDER_SITE_OTHER): Payer: Medicare Other | Admitting: Podiatry

## 2022-09-20 ENCOUNTER — Encounter: Payer: Self-pay | Admitting: Podiatry

## 2022-09-20 DIAGNOSIS — M79675 Pain in left toe(s): Secondary | ICD-10-CM | POA: Diagnosis not present

## 2022-09-20 DIAGNOSIS — B351 Tinea unguium: Secondary | ICD-10-CM

## 2022-09-20 DIAGNOSIS — M79674 Pain in right toe(s): Secondary | ICD-10-CM | POA: Diagnosis not present

## 2022-09-21 NOTE — Progress Notes (Signed)
Subjective:   Patient ID: Sharon Robinson, female   DOB: 76 y.o.   MRN: [id number]   HPI Patient presents with nail disease 1-5 both feet that are thick and they are painful patient states that she has tried to trim them herself and other modalities without relief patient does not smoke and is not active   Review of Systems  All other systems reviewed and are negative.       Objective:  Physical Exam Vitals and nursing note reviewed.  Constitutional:      Appearance: She is well-developed.  Pulmonary:     Effort: Pulmonary effort is normal.  Musculoskeletal:        General: Normal range of motion.  Skin:    General: Skin is warm.  Neurological:     Mental Status: She is alert.     Yellow brittle nailbeds 1-5 both feet thick and painful with vascular status found to be mildly diminished sharp dull vibratory intact.  Patient has good muscle strength diminished range of motion subtalar midtarsal joint bilateral     Assessment:  Mycotic nail infection 1-5 both feet with pain     Plan:  Debrided nailbeds 1-5 both feet no angiogenic bleeding reappoint routine care

## 2022-10-05 ENCOUNTER — Ambulatory Visit: Payer: Medicare Other | Admitting: Orthopaedic Surgery

## 2022-10-05 ENCOUNTER — Ambulatory Visit (INDEPENDENT_AMBULATORY_CARE_PROVIDER_SITE_OTHER): Payer: Medicare Other

## 2022-10-05 ENCOUNTER — Encounter: Payer: Self-pay | Admitting: Orthopaedic Surgery

## 2022-10-05 DIAGNOSIS — Z96642 Presence of left artificial hip joint: Secondary | ICD-10-CM | POA: Diagnosis not present

## 2022-10-05 NOTE — Progress Notes (Signed)
Post-Op Visit Note   Patient: Sharon Robinson           Date of Birth: 08/08/1946           MRN: [id number] Visit Date: 10/05/2022 PCP: Janith Lima, MD   Assessment & Plan:  Chief Complaint:  Chief Complaint  Patient presents with   Left Hip - Follow-up    Left total hip arthroplasty 08/20/2022   Visit Diagnoses:  1. History of total hip replacement, left     Plan: 6 week THA follow up plan  Patient presents for follow up 6 weeks status post total hip replacement. She is doing well overall.  Complains of some tightness in the thigh and occasional radiating pain down the leg.    The bikini scar is healed and there is no evidence of infection. She has fluid painless hip range of motion.  Leg lengths are equal.  Good gait pattern.  TED hose may be discontinued. Radiographs reveal a total hip arthroplasty in good position, with no evidence of subsidence, loosening, or complicating features.   It was reinforced that with any procedure including dental work, colonoscopy, or any invasive procedure that pre-procedural prophylactic antibiotics must be taken to decrease the risk of infection. Reminders were given about signs to be aware of including redness, drainage, increased pain, fevers, calf pain, shortness of breath, or any concern should generate a phone call or a return to see [place] immediately. Will plan to follow up at 3 months postoperatively for next evaluation with radiographs at that time.   Follow-Up Instructions: Return in about 6 weeks (around 11/16/2022).   Orders:  Orders Placed This Encounter  Procedures   XR Pelvis 1-2 Views   No orders of the defined types were placed in this encounter.   Imaging: XR Pelvis 1-2 Views  Result Date: 10/05/2022 Stable left total hip replacement without complications   PMFS History: Patient Active Problem List   Diagnosis Date Noted   Status post total replacement of left hip 08/20/2022   Need for prophylactic vaccination and  inoculation against varicella 04/04/2022   Primary osteoarthritis of left hip 04/04/2022   Chronic left hip pain 04/03/2022   Encounter for general adult medical examination with abnormal findings 04/03/2022   Vitamin D deficiency disease 04/03/2022   Degenerative disc disease, lumbar 08/13/2017   OAB (overactive bladder) 04/16/2013   Tobacco abuse 08/26/2012   DI (detrusor instability)    STD (sexually transmitted disease)    Osteopenia    Hypercholesterolemia 08/20/2011   GERD (gastroesophageal reflux disease)    Past Medical History:  Diagnosis Date   Degenerative disc disease, lumbar 08/13/2017   Xray on 08/12/17   DI (detrusor instability)    GERD (gastroesophageal reflux disease)    H/O vitamin D deficiency    Hyperlipidemia    Osteopenia    Sinusitis    related to smoking   STD (sexually transmitted disease)    HSV 2    Family History  Problem Relation Age of Onset   Heart attack Mother 54   Cancer Father 76       colon    Past Surgical History:  Procedure Laterality Date   CATARACT EXTRACTION Bilateral 2016   OTHER SURGICAL HISTORY     T&A age 11   SHOULDER INJECTION Left 11/2018   Dr. Onnie Graham   TONSILLECTOMY     TOTAL HIP ARTHROPLASTY Left 08/20/2022   Procedure: LEFT TOTAL HIP [place];  Surgeon: Leandrew Koyanagi,  MD;  Location: [place];  Service: Orthopedics;  Laterality: Left;   TUBAL LIGATION  1989   Social History   Occupational History   Occupation: retired  Tobacco Use   Smoking status: Every Day    Packs/day: 0.25    Types: Cigarettes   Smokeless tobacco: Never  Vaping Use   Vaping Use: Never used  Substance and Sexual Activity   Alcohol use: Not Currently   Drug use: No   Sexual activity: Not Currently    Birth control/protection: Surgical, Post-menopausal    Comment: BTL,des neg, older than 16, more than 5

## 2022-11-15 ENCOUNTER — Ambulatory Visit (INDEPENDENT_AMBULATORY_CARE_PROVIDER_SITE_OTHER): Payer: Medicare Other

## 2022-11-15 ENCOUNTER — Telehealth: Payer: Self-pay | Admitting: *Deleted

## 2022-11-15 ENCOUNTER — Ambulatory Visit: Payer: Medicare Other | Admitting: Orthopaedic Surgery

## 2022-11-15 DIAGNOSIS — Z96642 Presence of left artificial hip joint: Secondary | ICD-10-CM | POA: Diagnosis not present

## 2022-11-15 MED ORDER — AMOXICILLIN 500 MG PO CAPS: 2000.0000 mg | ORAL_CAPSULE | Freq: Once | ORAL | 6 refills | Status: AC

## 2022-11-15 NOTE — Progress Notes (Signed)
Post-Op Visit Note   Patient: Sharon Robinson           Date of Birth: [birth year]           MRN: [id number] Visit Date: 11/15/2022 PCP: Janith Lima, MD   Assessment & Plan:  Chief Complaint:  Chief Complaint  Patient presents with   Left Hip - Routine Post Op   Visit Diagnoses:  1. History of total hip replacement, left     Plan: Patient is 3 months status post left total hip replacement on 08/20/2022.  She is doing well and sleeping well.  She has no real complaints.  Overall she is very happy examination of the left hip is unremarkable with a fully healed surgical site.  Fluid painless range of motion of the hip.  The x-rays demonstrate stable implant.  She can continue to advance activity as tolerated.  Dental prophylaxis reinforced.  Recheck in 3 months with standing AP pelvis x-rays.  Follow-Up Instructions: Return in about 3 months (around 02/14/2023).   Orders:  Orders Placed This Encounter  Procedures   XR Pelvis 1-2 Views   Meds ordered this encounter  Medications   amoxicillin (AMOXIL) 500 MG capsule    Sig: Take 4 capsules (2,000 mg total) by mouth once for 1 dose.    Dispense:  4 capsule    Refill:  6    Imaging: XR Pelvis 1-2 Views  Result Date: 11/15/2022 Stable left total hip replacement without complications   PMFS History: Patient Active Problem List   Diagnosis Date Noted   Status post total replacement of left hip 08/20/2022   Need for prophylactic vaccination and inoculation against varicella 04/04/2022   Primary osteoarthritis of left hip 04/04/2022   Chronic left hip pain 04/03/2022   Encounter for general adult medical examination with abnormal findings 04/03/2022   Vitamin D deficiency disease 04/03/2022   Degenerative disc disease, lumbar 08/13/2017   OAB (overactive bladder) 04/16/2013   Tobacco abuse 08/26/2012   DI (detrusor instability)    STD (sexually transmitted disease)    Osteopenia    Hypercholesterolemia 08/20/2011    GERD (gastroesophageal reflux disease)    Past Medical History:  Diagnosis Date   Degenerative disc disease, lumbar 08/13/2017   Xray on 08/12/17   DI (detrusor instability)    GERD (gastroesophageal reflux disease)    H/O vitamin D deficiency    Hyperlipidemia    Osteopenia    Sinusitis    related to smoking   STD (sexually transmitted disease)    HSV 2    Family History  Problem Relation Age of Onset   Heart attack Mother 50   Cancer Father 50       colon    Past Surgical History:  Procedure Laterality Date   CATARACT EXTRACTION Bilateral 2016   OTHER SURGICAL HISTORY     T&A age 1   SHOULDER INJECTION Left 11/2018   Dr. Onnie Graham   TONSILLECTOMY     TOTAL HIP ARTHROPLASTY Left 08/20/2022   Procedure: LEFT TOTAL HIP ARTHROPLASTY-ANTERIOR APPROACH;  Surgeon: Leandrew Koyanagi, MD;  Location: [place];  Service: Orthopedics;  Laterality: Left;   TUBAL LIGATION  1989   Social History   Occupational History   Occupation: retired  Tobacco Use   Smoking status: Every Day    Packs/day: 0.25    Types: Cigarettes   Smokeless tobacco: Never  Vaping Use   Vaping Use: Never used  Substance and Sexual Activity  Alcohol use: Not Currently   Drug use: No   Sexual activity: Not Currently    Birth control/protection: Surgical, Post-menopausal    Comment: BTL,des neg, older than 16, more than 5

## 2022-11-15 NOTE — Telephone Encounter (Signed)
Ortho bundle calls completed.

## 2022-11-21 DIAGNOSIS — Z1231 Encounter for screening mammogram for malignant neoplasm of breast: Secondary | ICD-10-CM | POA: Diagnosis not present

## 2022-11-21 LAB — HM MAMMOGRAPHY

## 2022-11-22 DIAGNOSIS — L308 Other specified dermatitis: Secondary | ICD-10-CM | POA: Diagnosis not present

## 2022-12-07 ENCOUNTER — Other Ambulatory Visit: Payer: Self-pay

## 2022-12-07 ENCOUNTER — Emergency Department (HOSPITAL_COMMUNITY)
Admission: EM | Admit: 2022-12-07 | Discharge: 2022-12-07 | Disposition: A | Discharge status: Home / Self Care | Payer: Medicare Other | Attending: Emergency Medicine | Admitting: Emergency Medicine

## 2022-12-07 ENCOUNTER — Emergency Department (HOSPITAL_COMMUNITY): Payer: Medicare Other

## 2022-12-07 ENCOUNTER — Encounter (HOSPITAL_COMMUNITY): Payer: Self-pay | Admitting: Radiology

## 2022-12-07 DIAGNOSIS — Z7982 Long term (current) use of aspirin: Secondary | ICD-10-CM | POA: Insufficient documentation

## 2022-12-07 DIAGNOSIS — E785 Hyperlipidemia, unspecified: Secondary | ICD-10-CM | POA: Insufficient documentation

## 2022-12-07 DIAGNOSIS — Y92410 Unspecified street and highway as the place of occurrence of the external cause: Secondary | ICD-10-CM | POA: Insufficient documentation

## 2022-12-07 DIAGNOSIS — K219 Gastro-esophageal reflux disease without esophagitis: Secondary | ICD-10-CM | POA: Diagnosis not present

## 2022-12-07 DIAGNOSIS — M47812 Spondylosis without myelopathy or radiculopathy, cervical region: Secondary | ICD-10-CM | POA: Diagnosis not present

## 2022-12-07 DIAGNOSIS — E7800 Pure hypercholesterolemia, unspecified: Secondary | ICD-10-CM | POA: Diagnosis not present

## 2022-12-07 DIAGNOSIS — R42 Dizziness and giddiness: Secondary | ICD-10-CM | POA: Insufficient documentation

## 2022-12-07 DIAGNOSIS — R8290 Unspecified abnormal findings in urine: Secondary | ICD-10-CM | POA: Insufficient documentation

## 2022-12-07 DIAGNOSIS — R55 Syncope and collapse: Secondary | ICD-10-CM

## 2022-12-07 LAB — COMPREHENSIVE METABOLIC PANEL
ALT: 19 U/L (ref 0–44)
AST: 22 U/L (ref 15–41)
Albumin: 4.3 g/dL (ref 3.5–5.0)
Alkaline Phosphatase: 62 U/L (ref 38–126)
Anion gap: 8 (ref 5–15)
BUN: 25 mg/dL — ABNORMAL HIGH (ref 8–23)
CO2: 25 mmol/L (ref 22–32)
Calcium: 9.3 mg/dL (ref 8.9–10.3)
Chloride: 102 mmol/L (ref 98–111)
Creatinine, Ser: 0.65 mg/dL (ref 0.44–1.00)
GFR, Estimated: >60 mL/min (ref >60)
Glucose, Bld: 134 mg/dL — ABNORMAL HIGH (ref 70–99)
Potassium: 4.1 mmol/L (ref 3.5–5.1)
Sodium: 135 mmol/L (ref 135–145)
Total Bilirubin: 0.6 mg/dL (ref 0.3–1.2)
Total Protein: 7.4 g/dL (ref 6.5–8.1)

## 2022-12-07 LAB — CBC WITH DIFFERENTIAL/PLATELET
Abs Immature Granulocytes: 0.02 10*3/uL (ref 0.00–0.07)
Basophils Absolute: 0.1 10*3/uL (ref 0.0–0.1)
Basophils Relative: 1 %
Eosinophils Absolute: 0 10*3/uL (ref 0.0–0.5)
Eosinophils Relative: 0 %
HCT: 38.3 % (ref 36.0–46.0)
Hemoglobin: 12.3 g/dL (ref 12.0–15.0)
Immature Granulocytes: 0 %
Lymphocytes Relative: 20 %
Lymphs Abs: 1.8 10*3/uL (ref 0.7–4.0)
MCH: 33.7 pg (ref 26.0–34.0)
MCHC: 32.1 g/dL (ref 30.0–36.0)
MCV: 104.9 fL — ABNORMAL HIGH (ref 80.0–100.0)
Monocytes Absolute: 0.5 10*3/uL (ref 0.1–1.0)
Monocytes Relative: 5 %
Neutro Abs: 6.8 10*3/uL (ref 1.7–7.7)
Neutrophils Relative %: 74 %
Platelets: 339 10*3/uL (ref 150–400)
RBC: 3.65 MIL/uL — ABNORMAL LOW (ref 3.87–5.11)
RDW: 13.2 % (ref 11.5–15.5)
WBC: 9.1 10*3/uL (ref 4.0–10.5)
nRBC: 0 % (ref 0.0–0.2)

## 2022-12-07 LAB — URINALYSIS, ROUTINE W REFLEX MICROSCOPIC
Bilirubin Urine: NEGATIVE
Glucose, UA: NEGATIVE mg/dL
Hgb urine dipstick: NEGATIVE
Ketones, ur: NEGATIVE mg/dL
Nitrite: NEGATIVE
Protein, ur: NEGATIVE mg/dL
Specific Gravity, Urine: 1.025 (ref 1.005–1.030)
pH: 6 (ref 5.0–8.0)

## 2022-12-07 LAB — URINALYSIS, MICROSCOPIC (REFLEX): RBC / HPF: NONE SEEN RBC/hpf (ref 0–5)

## 2022-12-07 LAB — TROPONIN I (HIGH SENSITIVITY)
Troponin I (High Sensitivity): 2 ng/L (ref <18)
Troponin I (High Sensitivity): 3 ng/L (ref <18)

## 2022-12-07 MED ORDER — SODIUM CHLORIDE 0.9 % IV BOLUS
500.0000 mL | Freq: Once | INTRAVENOUS | Status: AC
  Administered 2022-12-07: 500 mL via INTRAVENOUS

## 2022-12-07 NOTE — ED Notes (Signed)
Unsuccessful IV attempt x2.  
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
## 2022-12-07 NOTE — ED Notes (Signed)
Pt ambulated to restroom and back to bed with no difficulties
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
## 2022-12-07 NOTE — Discharge Instructions (Addendum)
Your history, exam, evaluation today are consistent with several near syncopal episodes today that may be related to some mild dehydration as we discussed.  You prove stability for over 7 hours here without any recurrent symptoms and your workup was overall reassuring.  We did send off a culture to make sure you not have a UTI as she did have some bacteria in the urine but were not symptomatic otherwise.  We feel you are safe for discharge home.  Please follow-up with your primary doctor and rest and stay hydrated.  If any symptoms change or worsen acutely, please return to the nearest emergency room.

## 2022-12-09 LAB — URINE CULTURE: Culture: 80000 — AB

## 2022-12-11 ENCOUNTER — Telehealth: Payer: Self-pay

## 2022-12-11 NOTE — Telephone Encounter (Signed)
     Patient  visit on 2/16  at [place] you been able to follow up with your primary care physician? Yes   The patient was or was not able to obtain any needed medicine or equipment. Yes   Are there diet recommendations that you are having difficulty following? Na   Patient expresses understanding of discharge instructions and education provided has no other needs at this time.  Yes      [place] [phone] [address], [place], [place] [address] Phone: [phone] Email: [email or web address]

## 2022-12-17 ENCOUNTER — Ambulatory Visit (INDEPENDENT_AMBULATORY_CARE_PROVIDER_SITE_OTHER): Payer: Medicare Other

## 2022-12-17 VITALS — Ht 63.75 in | Wt 145.0 lb

## 2022-12-17 DIAGNOSIS — Z0000 Encounter for general adult medical examination without abnormal findings: Secondary | ICD-10-CM | POA: Diagnosis not present

## 2022-12-17 NOTE — Patient Instructions (Signed)
[name] , Thank you for taking time to come for your Medicare Wellness Visit. I appreciate your ongoing commitment to your health goals. Please review the following plan we discussed and let me know if I can assist you in the future.   These are the goals we discussed:  Goals      My goal for 2024 is to maintain my health.        This is a list of the screening recommended for you and due dates:  Health Maintenance  Topic Date Due   Zoster (Shingles) Vaccine (1 of 2) Never done   Pap Smear  06/11/2019   Flu Shot  05/22/2022   COVID-19 Vaccine (3 - 2023-24 season) 06/22/2022   Medicare Annual Wellness Visit  12/18/2023   DTaP/Tdap/Td vaccine (2 - Td or Tdap) 05/06/2024   Pneumonia Vaccine  Completed   DEXA scan (bone density measurement)  Completed   Hepatitis C Screening: USPSTF Recommendation to screen - Ages 20-79 yo.  Completed   HPV Vaccine  Aged Out   Colon Cancer Screening  Discontinued    Advanced directives: Yes  Conditions/risks identified: Yes  Next appointment: Follow up in one year for your annual wellness visit.   Preventive Care 30 Years and Older, Female Preventive care refers to lifestyle choices and visits with your health care provider that can promote health and wellness. What does preventive care include? A yearly physical exam. This is also called an annual well check. Dental exams once or twice a year. Routine eye exams. Ask your health care provider how often you should have your eyes checked. Personal lifestyle choices, including: Daily care of your teeth and gums. Regular physical activity. Eating a healthy diet. Avoiding tobacco and drug use. Limiting alcohol use. Practicing safe sex. Taking low-dose aspirin every day. Taking vitamin and mineral supplements as recommended by your health care provider. What happens during an annual well check? The services and screenings done by your health care provider during your annual well check will  depend on your age, overall health, lifestyle risk factors, and family history of disease. Counseling  Your health care provider may ask you questions about your: Alcohol use. Tobacco use. Drug use. Emotional well-being. Home and relationship well-being. Sexual activity. Eating habits. History of falls. Memory and ability to understand (cognition). Work and work Statistician. Reproductive health. Screening  You may have the following tests or measurements: Height, weight, and BMI. Blood pressure. Lipid and cholesterol levels. These may be checked every 5 years, or more frequently if you are over 28 years old. Skin check. Lung cancer screening. You may have this screening every year starting at age 10 if you have a 30-pack-year history of smoking and currently smoke or have quit within the past 15 years. Fecal occult blood test (FOBT) of the stool. You may have this test every year starting at age 48. Flexible sigmoidoscopy or colonoscopy. You may have a sigmoidoscopy every 5 years or a colonoscopy every 10 years starting at age 54. Hepatitis C blood test. Hepatitis B blood test. Sexually transmitted disease (STD) testing. Diabetes screening. This is done by checking your blood sugar (glucose) after you have not eaten for a while (fasting). You may have this done every 1-3 years. Bone density scan. This is done to screen for osteoporosis. You may have this done starting at age 29. Mammogram. This may be done every 1-2 years. Talk to your health care provider about how often you should have regular mammograms.  Talk with your health care provider about your test results, treatment options, and if necessary, the need for more tests. Vaccines  Your health care provider may recommend certain vaccines, such as: Influenza vaccine. This is recommended every year. Tetanus, diphtheria, and acellular pertussis (Tdap, Td) vaccine. You may need a Td booster every 10 years. Zoster vaccine. You may  need this after age 27. Pneumococcal 13-valent conjugate (PCV13) vaccine. One dose is recommended after age 57. Pneumococcal polysaccharide (PPSV23) vaccine. One dose is recommended after age 87. Talk to your health care provider about which screenings and vaccines you need and how often you need them. This information is not intended to replace advice given to you by your health care provider. Make sure you discuss any questions you have with your health care provider. Document Released: 11/04/2015 Document Revised: 06/27/2016 Document Reviewed: 08/09/2015 Elsevier Interactive Patient Education  2017 [place] Prevention in the Home Falls can cause injuries. They can happen to people of all ages. There are many things you can do to make your home safe and to help prevent falls. What can I do on the outside of my home? Regularly fix the edges of walkways and driveways and fix any cracks. Remove anything that might make you trip as you walk through a door, such as a raised step or threshold. Trim any bushes or trees on the path to your home. Use bright outdoor lighting. Clear any walking paths of anything that might make someone trip, such as rocks or tools. Regularly check to see if handrails are loose or broken. Make sure that both sides of any steps have handrails. Any raised decks and porches should have guardrails on the edges. Have any leaves, snow, or ice cleared regularly. Use sand or salt on walking paths during winter. Clean up any spills in your garage right away. This includes oil or grease spills. What can I do in the bathroom? Use night lights. Install grab bars by the toilet and in the tub and shower. Do not use towel bars as grab bars. Use non-skid mats or decals in the tub or shower. If you need to sit down in the shower, use a plastic, non-slip stool. Keep the floor dry. Clean up any water that spills on the floor as soon as it happens. Remove soap buildup in  the tub or shower regularly. Attach bath mats securely with double-sided non-slip rug tape. Do not have throw rugs and other things on the floor that can make you trip. What can I do in the bedroom? Use night lights. Make sure that you have a light by your bed that is easy to reach. Do not use any sheets or blankets that are too big for your bed. They should not hang down onto the floor. Have a firm chair that has side arms. You can use this for support while you get dressed. Do not have throw rugs and other things on the floor that can make you trip. What can I do in the kitchen? Clean up any spills right away. Avoid walking on wet floors. Keep items that you use a lot in easy-to-reach places. If you need to reach something above you, use a strong step stool that has a grab bar. Keep electrical cords out of the way. Do not use floor polish or wax that makes floors slippery. If you must use wax, use non-skid floor wax. Do not have throw rugs and other things on the floor that can make  you trip. What can I do with my stairs? Do not leave any items on the stairs. Make sure that there are handrails on both sides of the stairs and use them. Fix handrails that are broken or loose. Make sure that handrails are as long as the stairways. Check any carpeting to make sure that it is firmly attached to the stairs. Fix any carpet that is loose or worn. Avoid having throw rugs at the top or bottom of the stairs. If you do have throw rugs, attach them to the floor with carpet tape. Make sure that you have a light switch at the top of the stairs and the bottom of the stairs. If you do not have them, ask someone to add them for you. What else can I do to help prevent falls? Wear shoes that: Do not have high heels. Have rubber bottoms. Are comfortable and fit you well. Are closed at the toe. Do not wear sandals. If you use a stepladder: Make sure that it is fully opened. Do not climb a closed  stepladder. Make sure that both sides of the stepladder are locked into place. Ask someone to hold it for you, if possible. Clearly mark and make sure that you can see: Any grab bars or handrails. First and last steps. Where the edge of each step is. Use tools that help you move around (mobility aids) if they are needed. These include: Canes. Walkers. Scooters. Crutches. Turn on the lights when you go into a dark area. Replace any light bulbs as soon as they burn out. Set up your furniture so you have a clear path. Avoid moving your furniture around. If any of your floors are uneven, fix them. If there are any pets around you, be aware of where they are. Review your medicines with your doctor. Some medicines can make you feel dizzy. This can increase your chance of falling. Ask your doctor what other things that you can do to help prevent falls. This information is not intended to replace advice given to you by your health care provider. Make sure you discuss any questions you have with your health care provider. Document Released: 08/04/2009 Document Revised: 03/15/2016 Document Reviewed: 11/12/2014 Elsevier Interactive Patient Education  2017 [employer].

## 2022-12-17 NOTE — Progress Notes (Signed)
I connected with  Sharon Robinson on 12/17/2022 at 9:15 a.m. EST by telephone and verified that I am speaking with the correct person using two identifiers.  Location: Patient: Home Provider: [place] Persons participating in the virtual visit: [place]   I discussed the limitations, risks, security and privacy concerns of performing an evaluation and management service by telephone and the availability of in person appointments. The patient expressed understanding and agreed to proceed.  Interactive audio and video telecommunications were attempted between this nurse and patient, however failed, due to patient having technical difficulties OR patient did not have access to video capability.  We continued and completed visit with audio only.  Some vital signs may be absent or patient reported.   Sheral Flow, LPN  Subjective:   Sharon Robinson is a 77 y.o. female who presents for Medicare Annual (Subsequent) preventive examination.  Patient Medicare AWV questionnaire was completed by the patient on 12/13/2022; I have confirmed that all information answered by patient is correct and no changes since this date.    Review of Systems     Cardiac Risk Factors include: advanced age (>57mn, >>52women)     Objective:    Today's Vitals   12/17/22 0917 12/17/22 0919  Weight: 145 lb (65.8 kg)   Height: 5' 3.75" (1.619 m)   PainSc: 0-No pain 0-No pain   Body mass index is 25.08 kg/m.     12/17/2022    9:22 AM 12/07/2022    2:17 PM 08/07/2022    2:26 PM 08/07/2022    2:24 PM 12/15/2021    9:54 AM 10/05/2020    2:54 PM 09/03/2019    2:57 PM  Advanced Directives  Does Patient Have a Medical Advance Directive? Yes No  Yes Yes Yes Yes  Type of AParamed[place]of [place] will   [place] will [place] will Healthcare Power of [place] will   Does patient want to make changes to medical advance directive?   No - Patient declined      Copy of [place] Chart? No - copy requested  No - copy requested  No - copy requested No - copy requested No - copy requested    Current Medications (verified) Outpatient Encounter Medications as of 12/17/2022  Medication Sig   aspirin EC 81 MG tablet Take 1 tablet (81 mg total) by mouth 2 (two) times daily. To be taken after surgery to prevent blood clots   atorvastatin (LIPITOR) 40 MG tablet TAKE ONE TABLET BY MOUTH DAILY   cetirizine (ZYRTEC ALLERGY) 10 MG tablet Take 1 tablet (10 mg total) by mouth daily.   Cholecalciferol (VITAMIN D) 50 MCG (2000 UT) tablet Take 2,000 Units by mouth daily.   docusate sodium (COLACE) 100 MG capsule Take 1 capsule (100 mg total) by mouth daily as needed.   methocarbamol (ROBAXIN) 500 MG tablet Take 1 tablet (500 mg total) by mouth 2 (two) times daily as needed.   valACYclovir (VALTREX) 500 MG tablet Take 1 tablet (500 mg total) by mouth daily. (Patient taking differently: Take 500 mg by mouth at bedtime.)   alendronate (FOSAMAX) 70 MG tablet Take 1 tablet (70 mg total) by mouth once a week. Take with a full glass of water on an empty stomach.   [DISCONTINUED] methocarbamol (ROBAXIN) 500 MG tablet Take 1 tablet (500 mg total) by mouth 2 (two) times daily as needed for  muscle spasms.   [DISCONTINUED] ondansetron (ZOFRAN) 4 MG tablet Take 1 tablet (4 mg total) by mouth every 8 (eight) hours as needed for nausea or vomiting.   [DISCONTINUED] oxyCODONE-acetaminophen (PERCOCET) 5-325 MG tablet Take 1-2 tablets by mouth every 6 (six) hours as needed.   [DISCONTINUED] traMADol (ULTRAM) 50 MG tablet Take 1 tablet (50 mg total) by mouth 3 (three) times daily as needed.   No facility-administered encounter medications on file as of 12/17/2022.    Allergies (verified) Patient has no known allergies.   History: Past Medical History:  Diagnosis Date    Degenerative disc disease, lumbar 08/13/2017   Xray on 08/12/17   DI (detrusor instability)    GERD (gastroesophageal reflux disease)    H/O vitamin D deficiency    Hyperlipidemia    Osteopenia    Sinusitis    related to smoking   STD (sexually transmitted disease)    HSV 2   Past Surgical History:  Procedure Laterality Date   CATARACT EXTRACTION Bilateral 2016   OTHER SURGICAL HISTORY     T&A age 62   SHOULDER INJECTION Left 11/2018   Dr. Onnie Graham   TONSILLECTOMY     TOTAL HIP ARTHROPLASTY Left 08/20/2022   Procedure: LEFT TOTAL HIP ARTHROPLASTY-ANTERIOR APPROACH;  Surgeon: Leandrew Koyanagi, MD;  Location: [facility];  Service: Orthopedics;  Laterality: Left;   TUBAL LIGATION  1989   Family History  Problem Relation Age of Onset   Heart attack Mother 45   Cancer Father 2       colon   Social History   Socioeconomic History   Marital status: Single    Spouse name: Not on file   Number of children: 0   Years of education: Not on file   Highest education level: Not on file  Occupational History   Occupation: retired  Tobacco Use   Smoking status: Every Day    Packs/day: 0.25    Types: Cigarettes   Smokeless tobacco: Never  Vaping Use   Vaping Use: Never used  Substance and Sexual Activity   Alcohol use: Not Currently   Drug use: No   Sexual activity: Not Currently    Birth control/protection: Surgical, Post-menopausal    Comment: BTL,des neg, older than 83, more than 5  Other Topics Concern   Not on file  Social History Narrative   Not on file   Social Determinants of Health   Financial Resource Strain: Low Risk  (12/17/2022)   Overall Financial Resource Strain (CARDIA)    Difficulty of Paying Living Expenses: Not hard at all  Food Insecurity: No Food Insecurity (12/17/2022)   Hunger Vital Sign    Worried About Running Out of Food in the Last Year: Never true    [place] in the Last Year: Never true  Transportation Needs: No Transportation Needs (12/17/2022)    PRAPARE - Transportation    Lack of Transportation (Medical): No    Lack of Transportation (Non-Medical): No  Physical Activity: Insufficiently Active (12/17/2022)   Exercise Vital Sign    Days of Exercise per Week: 1 day    Minutes of Exercise per Session: 10 min  Stress: No Stress Concern Present (12/17/2022)   [place]    Feeling of Stress : Only a little  Social Connections: Moderately Integrated (12/17/2022)   Social Connection and Isolation Panel [NHANES]    Frequency of Communication with Friends and Family: More than three times a  week    Frequency of Social Gatherings with Friends and Family: More than three times a week    Attends Religious Services: More than 4 times per year    Active Member of [employer] or Organizations: Yes    Attends Music therapist: More than 4 times per year    Marital Status: Never married    Tobacco Counseling Ready to quit: Not Answered Counseling given: Not Answered   Clinical Intake:  Pre-visit preparation completed: Yes  Pain : No/denies pain Pain Score: 0-No pain     BMI - recorded: 25.08 Nutritional Status: BMI 25 -29 Overweight Nutritional Risks: None Diabetes: No  How often do you need to have someone help you when you read instructions, pamphlets, or other written materials from your doctor or pharmacy?: 1 - Never What is the last grade level you completed in school?: HSG  Diabetic? No  Interpreter Needed?: No  Information entered by :: Lisette Abu, LPN.   Activities of Daily Living    12/17/2022    9:23 AM 12/13/2022    6:13 PM  In your present state of health, do you have any difficulty performing the following activities:  Hearing? 0 0  Vision? 0 0  Difficulty concentrating or making decisions? 0 0  Walking or climbing stairs? 0 0  Dressing or bathing? 0 0  Doing errands, shopping? 0 0  Preparing Food and eating ? N N  Using the  Toilet? N N  In the past six months, have you accidently leaked urine? Y Y  Do you have problems with loss of bowel control? N N  Managing your Medications? N N  Managing your Finances? N N  Housekeeping or managing your Housekeeping? N N    Patient Care Team: Janith Lima, MD as PCP - General (Internal Medicine) Justice Britain, MD as Consulting Physician (Orthopedic Surgery) Princess Bruins, MD as Consulting Physician (Obstetrics and Gynecology)  Indicate any recent Medical Services you may have received from other than [facility] providers in the past year (date may be approximate).     Assessment:   This is a routine wellness examination for Sharon Robinson.  Hearing/Vision screen Hearing Screening - Comments:: Denies hearing difficulties.   Dietary issues and exercise activities discussed: Current Exercise Habits: Home exercise routine, Type of exercise: walking, Time (Minutes): 30, Frequency (Times/Week): 5, Weekly Exercise (Minutes/Week): 150, Intensity: Moderate, Exercise limited by: orthopedic condition(s)   Goals Addressed             This Visit's Progress    My goal for 2024 is to maintain my health.        Depression Screen    12/17/2022    9:31 AM 12/15/2021    9:57 AM 12/15/2021    9:52 AM 02/09/2021    1:13 PM 10/05/2020    2:53 PM 09/03/2019    2:59 PM 01/09/2019   11:35 AM  PHQ 2/9 Scores  PHQ - 2 Score 0 0 0 0 0 0 0    Fall Risk    12/17/2022    9:23 AM 12/13/2022    6:13 PM 12/15/2021    9:55 AM 02/09/2021    1:13 PM 10/05/2020    2:54 PM  Fall Risk   Falls in the past year? 0 0 1 0 0  Number falls in past yr: 0 0 0 0 0  Injury with Fall? 1 0 1 0 0  Risk for fall due to : No Fall Risks  History of  fall(s)  No Fall Risks  Follow up Falls evaluation completed  Falls evaluation completed;Education provided  Falls evaluation completed;Education provided    FALL RISK PREVENTION PERTAINING TO THE HOME:  Any stairs in or around the home? Yes  to attic If  so, are there any without handrails? No  Home free of loose throw rugs in walkways, pet beds, electrical cords, etc? Yes  Adequate lighting in your home to reduce risk of falls? Yes   ASSISTIVE DEVICES UTILIZED TO PREVENT FALLS:  Life alert? No  Use of a cane, walker or w/c? No  Grab bars in the bathroom? Yes  Shower chair or bench in shower? Yes  Elevated toilet seat or a handicapped toilet? Yes   TIMED UP AND GO:  Was the test performed? No . Telephonic Visit   Cognitive Function:    09/01/2018    2:39 PM 08/26/2017    3:43 PM  MMSE - Mini Mental State Exam  Orientation to time 5 5  Orientation to Place 5 5  Registration 3   Attention/ Calculation 5   Recall 2   Language- name 2 objects 2   Language- repeat 1   Language- follow 3 step command 3   Language- read & follow direction 1   Write a sentence 1   Copy design 1   Total score 29         12/17/2022    9:28 AM 10/05/2020    2:56 PM  6CIT Screen  What Year? 0 points 0 points  What month? 0 points 0 points  What time? 0 points 0 points  Count back from 20 0 points 0 points  Months in reverse 0 points 0 points  Repeat phrase 0 points 0 points  Total Score 0 points 0 points    Immunizations Immunization History  Administered Date(s) Administered   Fluad Quad(high Dose 65+) 09/03/2019, 10/05/2020   Hepatitis A, Adult 12/02/2017   PFIZER(Purple Top)SARS-COV-2 Vaccination 12/20/2019, 01/19/2020   Pneumococcal Conjugate-13 12/25/2016   Pneumococcal Polysaccharide-23 08/22/2017, 09/01/2018   Tdap 05/06/2014   Zoster, Live 05/20/2013    TDAP status: Up to date  Flu Vaccine status: Declined, Education has been provided regarding the importance of this vaccine but patient still declined. Advised may receive this vaccine at local pharmacy or Health Dept. Aware to provide a copy of the vaccination record if obtained from local pharmacy or Health Dept. Verbalized acceptance and understanding.  Pneumococcal  vaccine status: Up to date  Covid-19 vaccine status: Completed vaccines  Qualifies for Shingles Vaccine? Yes   Zostavax completed Yes   Shingrix Completed?: No.    Education has been provided regarding the importance of this vaccine. Patient has been advised to call insurance company to determine out of pocket expense if they have not yet received this vaccine. Advised may also receive vaccine at local pharmacy or Health Dept. Verbalized acceptance and understanding.  Screening Tests Health Maintenance  Topic Date Due   Zoster Vaccines- Shingrix (1 of 2) Never done   PAP SMEAR-Modifier  06/11/2019   INFLUENZA VACCINE  05/22/2022   COVID-19 Vaccine (3 - 2023-24 season) 06/22/2022   Medicare Annual Wellness (AWV)  12/18/2023   DTaP/Tdap/Td (2 - Td or Tdap) 05/06/2024   Pneumonia Vaccine 17+ Years old  Completed   DEXA SCAN  Completed   Hepatitis C Screening  Completed   HPV VACCINES  Aged Out   COLONOSCOPY (Pts 45-87yr Insurance coverage will need to be confirmed)  Discontinued  Health Maintenance  Health Maintenance Due  Topic Date Due   Zoster Vaccines- Shingrix (1 of 2) Never done   PAP SMEAR-Modifier  06/11/2019   INFLUENZA VACCINE  05/22/2022   COVID-19 Vaccine (3 - 2023-24 season) 06/22/2022    Colorectal cancer screening: No longer required.   Mammogram status: Completed 11/21/2022. Repeat every year  Bone Density status: Completed 12/13/2020. Results reflect: Bone density results: OSTEOPENIA. Repeat every 2-3 years.  Lung Cancer Screening: (Low Dose CT Chest recommended if Age 32-80 years, 30 pack-year currently smoking OR have quit w/in 15years.) does not qualify.   Lung Cancer Screening Referral: no  Additional Screening:  Hepatitis C Screening: does qualify; Completed 04/25/2017  Vision Screening: Recommended annual ophthalmology exams for early detection of glaucoma and other disorders of the eye. Is the patient up to date with their annual eye exam?  Yes   Who is the provider or what is the name of the office in which the patient attends annual eye exams? [facility] If pt is not established with a provider, would they like to be referred to a provider to establish care? No .   Dental Screening: Recommended annual dental exams for proper oral hygiene  Community Resource Referral / Chronic Care Management: CRR required this visit?  No   CCM required this visit?  No      Plan:     I have personally reviewed and noted the following in the patient's chart:   Medical and social history Use of alcohol, tobacco or illicit drugs  Current medications and supplements including opioid prescriptions. Patient is not currently taking opioid prescriptions. Functional ability and status Nutritional status Physical activity Advanced directives List of other physicians Hospitalizations, surgeries, and ER visits in previous 12 months Vitals Screenings to include cognitive, depression, and falls Referrals and appointments  In addition, I have reviewed and discussed with patient certain preventive protocols, quality metrics, and best practice recommendations. A written personalized care plan for preventive services as well as general preventive health recommendations were provided to patient.     Sheral Flow, LPN   [id number]   Nurse Notes:  Normal cognitive status assessed by direct observation by this Nurse Health Advisor. No abnormalities found.   Patient Medicare AWV questionnaire was completed by the patient on 12/13/2022; I have confirmed that all information answered by patient is correct and no changes since this date.

## 2022-12-24 ENCOUNTER — Ambulatory Visit (INDEPENDENT_AMBULATORY_CARE_PROVIDER_SITE_OTHER): Payer: Medicare Other | Admitting: Internal Medicine

## 2022-12-24 VITALS — BP 118/70 | HR 77 | Temp 98.3°F | Ht 63.0 in | Wt 138.0 lb

## 2022-12-24 DIAGNOSIS — K219 Gastro-esophageal reflux disease without esophagitis: Secondary | ICD-10-CM | POA: Diagnosis not present

## 2022-12-24 DIAGNOSIS — M5136 Other intervertebral disc degeneration, lumbar region: Secondary | ICD-10-CM | POA: Diagnosis not present

## 2022-12-24 DIAGNOSIS — M1612 Unilateral primary osteoarthritis, left hip: Secondary | ICD-10-CM | POA: Diagnosis not present

## 2022-12-24 DIAGNOSIS — E7800 Pure hypercholesterolemia, unspecified: Secondary | ICD-10-CM
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
## 2022-12-25 DIAGNOSIS — H52223 Regular astigmatism, bilateral: Secondary | ICD-10-CM | POA: Diagnosis not present

## 2022-12-25 DIAGNOSIS — H5203 Hypermetropia, bilateral: Secondary | ICD-10-CM | POA: Diagnosis not present

## 2022-12-25 DIAGNOSIS — H53143 Visual discomfort, bilateral: Secondary | ICD-10-CM | POA: Diagnosis not present

## 2022-12-25 DIAGNOSIS — H524 Presbyopia: Secondary | ICD-10-CM | POA: Diagnosis not present

## 2022-12-25 DIAGNOSIS — H35373 Puckering of macula, bilateral: Secondary | ICD-10-CM | POA: Diagnosis not present

## 2022-12-28 ENCOUNTER — Encounter: Payer: Self-pay | Admitting: Internal Medicine

## 2022-12-30 ENCOUNTER — Other Ambulatory Visit: Payer: Self-pay | Admitting: Internal Medicine

## 2022-12-30 DIAGNOSIS — E7800 Pure hypercholesterolemia, unspecified: Secondary | ICD-10-CM

## 2023-01-02 ENCOUNTER — Ambulatory Visit (INDEPENDENT_AMBULATORY_CARE_PROVIDER_SITE_OTHER): Payer: Medicare Other | Admitting: Obstetrics & Gynecology

## 2023-01-02 ENCOUNTER — Encounter: Payer: Self-pay | Admitting: Obstetrics & Gynecology

## 2023-01-02 VITALS — BP 118/64 | HR 78 | Resp 16 | Ht 63.75 in | Wt 138.0 lb

## 2023-01-02 DIAGNOSIS — Z780 Asymptomatic menopausal state: Secondary | ICD-10-CM

## 2023-01-02 DIAGNOSIS — Z9189 Other specified personal risk factors, not elsewhere classified: Secondary | ICD-10-CM

## 2023-01-02 DIAGNOSIS — M85852 Other specified disorders of bone density and structure, left thigh: Secondary | ICD-10-CM

## 2023-01-02 DIAGNOSIS — Z01419 Encounter for gynecological examination (general) (routine) without abnormal findings: Secondary | ICD-10-CM

## 2023-01-02 DIAGNOSIS — M85851 Other specified disorders of bone density and structure, right thigh: Secondary | ICD-10-CM

## 2023-01-02 DIAGNOSIS — B009 Herpesviral infection, unspecified: Secondary | ICD-10-CM

## 2023-01-02 DIAGNOSIS — Z9289 Personal history of other medical treatment: Secondary | ICD-10-CM

## 2023-01-02 NOTE — Progress Notes (Signed)
Sharon Robinson 1945-12-31 RN:[id number]   History:    77 y.o.  G0 Single.  Retired Pharmacist, hospital.   RP:  Established patient presenting for annual gyn exam    HPI: Postmenopausal, well on no HRT.  No PMB.  No pelvic pain.  Abstinent.  Last Pap Neg 05/2017. No h/o abnormal Pap.  Urine normal, except from some SUI.  BMs normal.  Breasts normal. Mammo 10/2022, Lt breast Negative 09/2021.  BD Osteopenia T-Score -2.3 at the Rt Femoral Neck 11/2020.  On Fosamax. Had a fall recently, no fracture. Will repeat a BD in 11/2022.  BMI 23.87.  Walking and yard work. Health labs with Fam MD. COLONOSCOPY: 2009   Past medical history,surgical history, family history and social history were all reviewed and documented in the EPIC chart.  Gynecologic History No LMP recorded. Patient is postmenopausal.  Obstetric History OB History  Gravida Para Term Preterm AB Living  0 0 0 0 0 0  SAB IAB Ectopic Multiple Live Births  0 0 0 0 0     ROS: A ROS was performed and pertinent positives and negatives are included in the history. GENERAL: No fevers or chills. HEENT: No change in vision, no earache, sore throat or sinus congestion. NECK: No pain or stiffness. CARDIOVASCULAR: No chest pain or pressure. No palpitations. PULMONARY: No shortness of breath, cough or wheeze. GASTROINTESTINAL: No abdominal pain, nausea, vomiting or diarrhea, melena or bright red blood per rectum. GENITOURINARY: No urinary frequency, urgency, hesitancy or dysuria. MUSCULOSKELETAL: No joint or muscle pain, no back pain, no recent trauma. DERMATOLOGIC: No rash, no itching, no lesions. ENDOCRINE: No polyuria, polydipsia, no heat or cold intolerance. No recent change in weight. HEMATOLOGICAL: No anemia or easy bruising or bleeding. NEUROLOGIC: No headache, seizures, numbness, tingling or weakness. PSYCHIATRIC: No depression, no loss of interest in normal activity or change in sleep pattern.     Exam:   BP 118/64   Pulse 78   Resp 16   Ht 5' 3.75"  (1.619 m)   Wt 138 lb (62.6 kg)   BMI 23.87 kg/m   Body mass index is 23.87 kg/m.  General appearance : Well developed well nourished female. No acute distress HEENT: Eyes: no retinal hemorrhage or exudates,  Neck supple, trachea midline, no carotid bruits, no thyroidmegaly Lungs: Clear to auscultation, no rhonchi or wheezes, or rib retractions  Heart: Regular rate and rhythm, no murmurs or gallops Breast:Examined in sitting and supine position were symmetrical in appearance, no palpable masses or tenderness,  no skin retraction, no nipple inversion, no nipple discharge, no skin discoloration, no axillary or supraclavicular lymphadenopathy Abdomen: no palpable masses or tenderness, no rebound or guarding Extremities: no edema or skin discoloration or tenderness  Pelvic: Vulva: Normal             Vagina: No gross lesions or discharge  Cervix: No gross lesions or discharge  Uterus  AV, normal size, shape and consistency, non-tender and mobile  Adnexa  Without masses or tenderness  Anus: Normal   Assessment/Plan:  77 y.o. female for annual exam   1. Well female exam with routine gynecological exam Postmenopausal, well on no HRT.  No PMB.  No pelvic pain.  Abstinent.  Last Pap Neg 05/2017. No h/o abnormal Pap.  Urine normal, except from some SUI.  BMs normal.  Breasts normal. Mammo 10/2022, Lt breast Negative 09/2021.  BD Osteopenia T-Score -2.3 at the Rt Femoral Neck 11/2020.  On Fosamax. Had a fall recently,  no fracture. Will repeat a BD in 11/2022.  BMI 23.87.  Walking and yard work. Health labs with Fam MD. COLONOSCOPY: 2009.  2. Postmenopausal Postmenopausal, well on no HRT.  No PMB.  No pelvic pain.  Abstinent.   3. Osteopenia of both hips BD Osteopenia T-Score -2.3 at the Rt Femoral Neck 11/2020.  On Fosamax for many years, stop Fosamax now. Had a fall recently, no fracture.  Schedule BD here now. - DG Bone Density; Future  4. Other specified personal risk factors, not elsewhere  classified  5. Personal history of other medical treatment   Princess Bruins MD, 2:40 PM

## 2023-01-29 ENCOUNTER — Ambulatory Visit (INDEPENDENT_AMBULATORY_CARE_PROVIDER_SITE_OTHER): Payer: Medicare Other

## 2023-01-29 ENCOUNTER — Other Ambulatory Visit: Payer: Self-pay | Admitting: Obstetrics & Gynecology

## 2023-01-29 DIAGNOSIS — Z9289 Personal history of other medical treatment: Secondary | ICD-10-CM

## 2023-01-29 DIAGNOSIS — M85851 Other specified disorders of bone density and structure, right thigh: Secondary | ICD-10-CM

## 2023-01-29 DIAGNOSIS — Z780 Asymptomatic menopausal state: Secondary | ICD-10-CM

## 2023-01-29 DIAGNOSIS — Z13820 Encounter for screening for osteoporosis: Secondary | ICD-10-CM | POA: Diagnosis not present

## 2023-01-29 DIAGNOSIS — Z9189 Other specified personal risk factors, not elsewhere classified: Secondary | ICD-10-CM

## 2023-01-29 DIAGNOSIS — Z01419 Encounter for gynecological examination (general) (routine) without abnormal findings: Secondary | ICD-10-CM

## 2023-01-29 DIAGNOSIS — M8589 Other specified disorders of bone density and structure, multiple sites: Secondary | ICD-10-CM

## 2023-02-19 ENCOUNTER — Other Ambulatory Visit (INDEPENDENT_AMBULATORY_CARE_PROVIDER_SITE_OTHER): Payer: Medicare Other

## 2023-02-19 ENCOUNTER — Encounter: Payer: Self-pay | Admitting: Orthopaedic Surgery

## 2023-02-19 ENCOUNTER — Ambulatory Visit (INDEPENDENT_AMBULATORY_CARE_PROVIDER_SITE_OTHER): Payer: Medicare Other | Admitting: Orthopaedic Surgery

## 2023-02-19 DIAGNOSIS — Z96642 Presence of left artificial hip joint: Secondary | ICD-10-CM | POA: Diagnosis not present

## 2023-02-19 NOTE — Progress Notes (Signed)
Office Visit Note   Patient: Sharon Robinson           Date of Birth: May 17, 1946           MRN: [id number] Visit Date: 02/19/2023              Requested by: Etta Grandchild, MD [id number] [place],  [place] [address] PCP: Etta Grandchild, MD   Assessment & Plan: Visit Diagnoses:  1. History of total hip replacement, left     Plan: Sharon Robinson is now 6 months status post left total hip replacement.  She is doing great has no complaints.  Instructions reviewed.  We will see her back in 6 months for 1 year visit with standing AP pelvis x-rays.  Follow-Up Instructions: Return in about 6 months (around 08/21/2023).   Orders:  Orders Placed This Encounter  Procedures   XR Pelvis 1-2 Views   No orders of the defined types were placed in this encounter.     Procedures: No procedures performed   Clinical Data: No additional findings.   Subjective: Chief Complaint  Patient presents with   Left Hip - Routine Post Op    HPI  Sharon Robinson is here for 6 month postop visit.  No complaints  Review of Systems   Objective: Vital Signs: There were no vitals taken for this visit.  Physical Exam  Ortho Exam  Left hip - healed surgical scar - fluid painless ROM - normal gait  Specialty Comments:  No specialty comments available.  Imaging: XR Pelvis 1-2 Views  Result Date: 02/19/2023 Stable left total hip replacement without complications    PMFS History: Patient Active Problem List   Diagnosis Date Noted   Need for prophylactic vaccination and inoculation against varicella 04/04/2022   Primary osteoarthritis of left hip 04/04/2022   Encounter for general adult medical examination with abnormal findings 04/03/2022   Vitamin D deficiency disease 04/03/2022   Impingement syndrome of left shoulder region 10/28/2017   Degenerative disc disease, lumbar 08/13/2017   OAB (overactive bladder) 04/16/2013   Tobacco abuse 08/26/2012   DI (detrusor instability)    Osteopenia     Hypercholesterolemia 08/20/2011   GERD (gastroesophageal reflux disease)    Past Medical History:  Diagnosis Date   Degenerative disc disease, lumbar 08/13/2017   Xray on 08/12/17   DI (detrusor instability)    GERD (gastroesophageal reflux disease)    H/O vitamin D deficiency    Hyperlipidemia    Osteopenia    Sinusitis    related to smoking   STD (sexually transmitted disease)    HSV 2    Family History  Problem Relation Age of Onset   Heart attack Mother 89   Cancer Father 101       colon    Past Surgical History:  Procedure Laterality Date   CATARACT EXTRACTION Bilateral 2016   OTHER SURGICAL HISTORY     T&A age 50   SHOULDER INJECTION Left 11/2018   Dr. Rennis Chris   TONSILLECTOMY     TOTAL HIP ARTHROPLASTY Left 08/20/2022   Procedure: LEFT TOTAL HIP ARTHROPLASTY-ANTERIOR APPROACH;  Surgeon: Tarry Kos, MD;  Location: [facility] OR;  Service: Orthopedics;  Laterality: Left;   TUBAL LIGATION  1989   Social History   Occupational History   Occupation: retired  Tobacco Use   Smoking status: Every Day    Packs/day: .25    Types: Cigarettes   Smokeless tobacco: Never  Vaping Use   Vaping  Use: Never used  Substance and Sexual Activity   Alcohol use: Not Currently   Drug use: No   Sexual activity: Not Currently    Birth control/protection: Surgical, Post-menopausal    Comment: BTL,des neg, older than 16, more than 5

## 2023-04-16 ENCOUNTER — Other Ambulatory Visit: Payer: Self-pay | Admitting: Internal Medicine

## 2023-04-16 ENCOUNTER — Other Ambulatory Visit: Payer: Self-pay | Admitting: Obstetrics & Gynecology

## 2023-04-16 DIAGNOSIS — E7800 Pure hypercholesterolemia, unspecified: Secondary | ICD-10-CM

## 2023-04-16 NOTE — Telephone Encounter (Signed)
Med refill request: Valtrex Last AEX: 01/02/23 Next AEX: not scheduled Last MMG (if hormonal med) 11/21/22 Breast Density Cat C, BI-RADS CAT 1 neg Refill authorized: Please Advise, #[phone], 4 RF

## 2023-05-14 ENCOUNTER — Ambulatory Visit (INDEPENDENT_AMBULATORY_CARE_PROVIDER_SITE_OTHER): Payer: Medicare Other | Admitting: Internal Medicine

## 2023-05-14 ENCOUNTER — Encounter: Payer: Self-pay | Admitting: Internal Medicine

## 2023-05-14 VITALS — BP 116/56 | HR 65 | Temp 98.8°F | Ht 63.75 in | Wt 142.0 lb

## 2023-05-14 DIAGNOSIS — K219 Gastro-esophageal reflux disease without esophagitis: Secondary | ICD-10-CM

## 2023-05-14 DIAGNOSIS — Z0000 Encounter for general adult medical examination without abnormal findings: Secondary | ICD-10-CM

## 2023-05-14 DIAGNOSIS — E7800 Pure hypercholesterolemia, unspecified: Secondary | ICD-10-CM

## 2023-05-14 DIAGNOSIS — M1612 Unilateral primary osteoarthritis, left hip: Secondary | ICD-10-CM

## 2023-05-14 DIAGNOSIS — E559 Vitamin D deficiency, unspecified: Secondary | ICD-10-CM | POA: Diagnosis not present

## 2023-05-14 DIAGNOSIS — M85851 Other specified disorders of bone density and structure, right thigh: Secondary | ICD-10-CM

## 2023-05-14 DIAGNOSIS — M5136 Other intervertebral disc degeneration, lumbar region: Secondary | ICD-10-CM | POA: Diagnosis not present

## 2023-05-14 DIAGNOSIS — M85852 Other specified disorders of bone density and structure, left thigh: Secondary | ICD-10-CM

## 2023-05-14 DIAGNOSIS — Z0001 Encounter for general adult medical examination with abnormal findings: Secondary | ICD-10-CM

## 2023-05-14 LAB — HEPATIC FUNCTION PANEL
ALT: 14 U/L (ref 0–35)
AST: 18 U/L (ref 0–37)
Albumin: 4.1 g/dL (ref 3.5–5.2)
Alkaline Phosphatase: 57 U/L (ref 39–117)
Bilirubin, Direct: 0.1 mg/dL (ref 0.0–0.3)
Total Bilirubin: 0.3 mg/dL (ref 0.2–1.2)
Total Protein: 6.8 g/dL (ref 6.0–8.3)

## 2023-05-14 LAB — CBC WITH DIFFERENTIAL/PLATELET
Basophils Absolute: 0.1 10*3/uL (ref 0.0–0.1)
Basophils Relative: 1.2 % (ref 0.0–3.0)
Eosinophils Absolute: 0.1 10*3/uL (ref 0.0–0.7)
Eosinophils Relative: 1.6 % (ref 0.0–5.0)
HCT: 37.8 % (ref 36.0–46.0)
Hemoglobin: 12.5 g/dL (ref 12.0–15.0)
Lymphocytes Relative: 37.2 % (ref 12.0–46.0)
Lymphs Abs: 3 10*3/uL (ref 0.7–4.0)
MCHC: 33.1 g/dL (ref 30.0–36.0)
MCV: 103.4 fl — ABNORMAL HIGH (ref 78.0–100.0)
Monocytes Absolute: 0.6 10*3/uL (ref 0.1–1.0)
Monocytes Relative: 8 % (ref 3.0–12.0)
Neutro Abs: 4.2 10*3/uL (ref 1.4–7.7)
Neutrophils Relative %: 52 % (ref 43.0–77.0)
Platelets: 294 10*3/uL (ref 150.0–400.0)
RBC: 3.65 Mil/uL — ABNORMAL LOW (ref 3.87–5.11)
RDW: 13.6 % (ref 11.5–15.5)
WBC: 8 10*3/uL (ref 4.0–10.5)

## 2023-05-14 LAB — LIPID PANEL
Cholesterol: 137 mg/dL (ref 0–200)
HDL: 54.6 mg/dL (ref >39.00)
LDL Cholesterol: 57 mg/dL (ref 0–99)
NonHDL: 82.12
Total CHOL/HDL Ratio: 3
Triglycerides: 126 mg/dL (ref 0.0–149.0)
VLDL: 25.2 mg/dL (ref 0.0–40.0)

## 2023-05-14 LAB — BASIC METABOLIC PANEL
BUN: 19 mg/dL (ref 6–23)
CO2: 31 mEq/L (ref 19–32)
Calcium: 9.9 mg/dL (ref 8.4–10.5)
Chloride: 104 mEq/L (ref 96–112)
Creatinine, Ser: 0.81 mg/dL (ref 0.40–1.20)
GFR: 69.18 mL/min (ref >60.00)
Glucose, Bld: 98 mg/dL (ref 70–99)
Potassium: 4.3 mEq/L (ref 3.5–5.1)
Sodium: 140 mEq/L (ref 135–145)

## 2023-05-14 LAB — VITAMIN D 25 HYDROXY (VIT D DEFICIENCY, FRACTURES): VITD: 39.5 ng/mL (ref 30.00–100.00)

## 2023-05-14 LAB — TSH: TSH: 1.01 u[IU]/mL (ref 0.35–5.50)
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
## 2023-05-14 NOTE — Patient Instructions (Signed)

## 2023-05-15 MED ORDER — ATORVASTATIN CALCIUM 40 MG PO TABS: 40.0000 mg | ORAL_TABLET | Freq: Every day | ORAL | 1 refills | Status: DC

## 2023-08-20 ENCOUNTER — Ambulatory Visit: Payer: Medicare Other | Admitting: Orthopaedic Surgery

## 2023-08-23 ENCOUNTER — Other Ambulatory Visit (INDEPENDENT_AMBULATORY_CARE_PROVIDER_SITE_OTHER): Payer: Medicare Other

## 2023-08-23 ENCOUNTER — Encounter: Payer: Self-pay | Admitting: Orthopaedic Surgery

## 2023-08-23 ENCOUNTER — Ambulatory Visit: Payer: Medicare Other | Admitting: Orthopaedic Surgery

## 2023-08-23 DIAGNOSIS — Z96642 Presence of left artificial hip joint: Secondary | ICD-10-CM

## 2023-08-23 NOTE — Progress Notes (Signed)
Office Visit Note   Patient: Sharon Robinson           Date of Birth: 04-24-1946           MRN: [id number] Visit Date: 08/23/2023              Requested by: Etta Grandchild, MD [address] [place],  [place] [address] PCP: Etta Grandchild, MD   Assessment & Plan: Visit Diagnoses:  1. History of total hip replacement, left     Plan: Sharon Robinson has done very well from the left hip replacement.  Dental prophylaxis reinforced.  Recheck in another year with repeat pelvis x-rays.  Follow-Up Instructions: Return in about 1 year (around 08/22/2024).   Orders:  Orders Placed This Encounter  Procedures   XR Pelvis 1-2 Views   No orders of the defined types were placed in this encounter.     Procedures: No procedures performed   Clinical Data: No additional findings.   Subjective: Chief Complaint  Patient presents with   Left Hip - Pain    HPI Sharon Robinson is 1 year status post left total hip replacement.  She is doing well has no complaints.  Review of Systems   Objective: Vital Signs: There were no vitals taken for this visit.  Physical Exam  Ortho Exam Exam of the left hip shows a fully healed bikini surgical scar.  She has fluid painless range of motion.  Normal gait pattern. Specialty Comments:  No specialty comments available.  Imaging: XR Pelvis 1-2 Views  Result Date: 08/23/2023 Stable left total hip replacement without complications.  Mild to moderate osteoarthritis of the right hip.    PMFS History: Patient Active Problem List   Diagnosis Date Noted   Need for prophylactic vaccination and inoculation against varicella 04/04/2022   Primary osteoarthritis of left hip 04/04/2022   Encounter for general adult medical examination with abnormal findings 04/03/2022   Vitamin D deficiency disease 04/03/2022   Impingement syndrome of left shoulder region 10/28/2017   Degenerative disc disease, lumbar 08/13/2017   OAB (overactive bladder) 04/16/2013   Tobacco  abuse 08/26/2012   DI (detrusor instability)    Osteopenia    Hypercholesterolemia 08/20/2011   GERD (gastroesophageal reflux disease)    Past Medical History:  Diagnosis Date   Degenerative disc disease, lumbar 08/13/2017   Xray on 08/12/17   DI (detrusor instability)    GERD (gastroesophageal reflux disease)    H/O vitamin D deficiency    Hyperlipidemia    Osteopenia    Sinusitis    related to smoking   STD (sexually transmitted disease)    HSV 2    Family History  Problem Relation Age of Onset   Heart attack Mother 80   Cancer Father 61       colon    Past Surgical History:  Procedure Laterality Date   CATARACT EXTRACTION Bilateral 2016   OTHER SURGICAL HISTORY     T&A age 89   SHOULDER INJECTION Left 11/2018   Dr. Rennis Chris   TONSILLECTOMY     TOTAL HIP ARTHROPLASTY Left 08/20/2022   Procedure: LEFT TOTAL HIP ARTHROPLASTY-ANTERIOR APPROACH;  Surgeon: Tarry Kos, MD;  Location: [facility] OR;  Service: Orthopedics;  Laterality: Left;   TUBAL LIGATION  1989   Social History   Occupational History   Occupation: retired  Tobacco Use   Smoking status: Every Day    Current packs/day: 0.25    Types: Cigarettes   Smokeless tobacco: Never  Vaping Use   Vaping status: Never Used  Substance and Sexual Activity   Alcohol use: Not Currently   Drug use: No   Sexual activity: Not Currently    Birth control/protection: Surgical, Post-menopausal    Comment: BTL,des neg, older than 16, more than 5

## 2023-09-18 DIAGNOSIS — L814 Other melanin hyperpigmentation: Secondary | ICD-10-CM | POA: Diagnosis not present

## 2023-09-18 DIAGNOSIS — L2989 Other pruritus: Secondary | ICD-10-CM | POA: Diagnosis not present

## 2023-09-18 DIAGNOSIS — L570 Actinic keratosis: Secondary | ICD-10-CM | POA: Diagnosis not present

## 2023-09-18 DIAGNOSIS — L408 Other psoriasis: Secondary | ICD-10-CM | POA: Diagnosis not present

## 2023-09-18 DIAGNOSIS — L308 Other specified dermatitis: Secondary | ICD-10-CM | POA: Diagnosis not present

## 2023-09-18 DIAGNOSIS — L821 Other seborrheic keratosis: Secondary | ICD-10-CM | POA: Diagnosis not present

## 2023-11-28 ENCOUNTER — Ambulatory Visit: Payer: Medicare Other

## 2023-11-28 ENCOUNTER — Other Ambulatory Visit: Payer: Self-pay | Admitting: Internal Medicine

## 2023-11-28 VITALS — BP 110/60 | HR 75 | Ht 64.0 in | Wt 145.2 lb

## 2023-11-28 DIAGNOSIS — Z0000 Encounter for general adult medical examination without abnormal findings: Secondary | ICD-10-CM | POA: Diagnosis not present

## 2023-11-28 DIAGNOSIS — E7800 Pure hypercholesterolemia, unspecified: Secondary | ICD-10-CM

## 2023-11-28 NOTE — Patient Instructions (Signed)
 Sharon Robinson , Thank you for taking time to come for your Medicare Wellness Visit. I appreciate your ongoing commitment to your health goals. Please review the following plan we discussed and let me know if I can assist you in the future.   Referrals/Orders/Follow-Ups/Clinician Recommendations: It was nice to meet you today.  Keep up the good work.   This is a list of the screening recommended for you and due dates:  Health Maintenance  Topic Date Due   Zoster (Shingles) Vaccine (1 of 2) 03/21/1994   Pap Smear  06/11/2019   Flu Shot  05/23/2023   COVID-19 Vaccine (4 - 2024-25 season) 06/23/2023   DTaP/Tdap/Td vaccine (2 - Td or Tdap) 05/06/2024   Medicare Annual Wellness Visit  11/27/2024   Pneumonia Vaccine  Completed   DEXA scan (bone density measurement)  Completed   Hepatitis C Screening  Completed   HPV Vaccine  Aged Out   Colon Cancer Screening  Discontinued    Advanced directives: (Copy Requested) Please bring a copy of your health care power of attorney and living will to the office to be added to your chart at your convenience.  Next Medicare Annual Wellness Visit scheduled for next year: Yes

## 2023-11-28 NOTE — Progress Notes (Signed)
 Subjective:   Sharon Robinson is a 78 y.o. female who presents for Medicare Annual (Subsequent) preventive examination.  Visit Complete: In person  Patient Medicare AWV questionnaire was completed by the patient on 11/27/23; I have confirmed that all information answered by patient is correct and no changes since this date.  Cardiac Risk Factors include: advanced age (>4men, >83 women);dyslipidemia     Objective:    Today's Vitals   11/28/23 1356  BP: 110/60  Pulse: 75  SpO2: 99%  Weight: 145 lb 3.2 oz (65.9 kg)  Height: 5' 4 (1.626 m)   Body mass index is 24.92 kg/m.     11/28/2023    2:01 PM 12/17/2022    9:22 AM 12/07/2022    2:17 PM 08/07/2022    2:26 PM 08/07/2022    2:24 PM 12/15/2021    9:54 AM 10/05/2020    2:54 PM  Advanced Directives  Does Patient Have a Medical Advance Directive? Yes Yes No  Yes Yes Yes  Type of Estate Agent of [place];Living will Healthcare Power of [place];Living will   Healthcare Power of [place];Living will Healthcare Power of [place];Living will Healthcare Power of Attorney  Does patient want to make changes to medical advance directive?    No - Patient declined     Copy of Healthcare Power of Attorney in Chart? No - copy requested No - copy requested  No - copy requested  No - copy requested No - copy requested    Current Medications (verified) Outpatient Encounter Medications as of 11/28/2023  Medication Sig   atorvastatin  (LIPITOR) 40 MG tablet Take 1 tablet (40 mg total) by mouth daily.   cetirizine  (ZYRTEC  ALLERGY) 10 MG tablet Take 1 tablet (10 mg total) by mouth daily.   Cholecalciferol (VITAMIN D ) 50 MCG (2000 UT) tablet Take 2,000 Units by mouth daily.   docusate sodium  (COLACE) 50 MG capsule Take 50 mg by mouth 2 (two) times daily.   valACYclovir  (VALTREX ) 500 MG tablet TAKE ONE TABLET BY MOUTH DAILY   No facility-administered encounter medications on file as of 11/28/2023.    Allergies  (verified) Patient has no known allergies.   History: Past Medical History:  Diagnosis Date   Degenerative disc disease, lumbar 08/13/2017   Xray on 08/12/17   DI (detrusor instability)    GERD (gastroesophageal reflux disease)    H/O vitamin D  deficiency    Hyperlipidemia    Osteopenia    Sinusitis    related to smoking   STD (sexually transmitted disease)    HSV 2   Past Surgical History:  Procedure Laterality Date   CATARACT EXTRACTION Bilateral 2016   OTHER SURGICAL HISTORY     T&A age 52   SHOULDER INJECTION Left 11/2018   Dr. Melita   TONSILLECTOMY     TOTAL HIP ARTHROPLASTY Left 08/20/2022   Procedure: LEFT TOTAL HIP ARTHROPLASTY-ANTERIOR APPROACH;  Surgeon: Jerri Kay HERO, MD;  Location: [facility] OR;  Service: Orthopedics;  Laterality: Left;   TUBAL LIGATION  1989   Family History  Problem Relation Age of Onset   Heart attack Mother 52   Cancer Father 93       colon   Social History   Socioeconomic History   Marital status: Single    Spouse name: Not on file   Number of children: 0   Years of education: Not on file   Highest education level: Master's degree (e.g., MA, MS, MEng, MEd, MSW, MBA)  Occupational History  Occupation: retired  Tobacco Use   Smoking status: Every Day    Current packs/day: 0.25    Types: Cigarettes   Smokeless tobacco: Never  Vaping Use   Vaping status: Never Used  Substance and Sexual Activity   Alcohol use: Not Currently   Drug use: No   Sexual activity: Not Currently    Birth control/protection: Surgical, Post-menopausal    Comment: BTL,des neg, older than 16, more than 5  Other Topics Concern   Not on file  Social History Narrative   Lives alone-2025   Social Drivers of Health   Financial Resource Strain: Low Risk  (11/27/2023)   Overall Financial Resource Strain (CARDIA)    Difficulty of Paying Living Expenses: Not hard at all  Food Insecurity: No Food Insecurity (11/27/2023)   Hunger Vital Sign    Worried About Running  Out of Food in the Last Year: Never true    Ran Out of Food in the Last Year: Never true  Transportation Needs: No Transportation Needs (11/27/2023)   PRAPARE - Administrator, Civil Service (Medical): No    Lack of Transportation (Non-Medical): No  Physical Activity: Insufficiently Active (11/27/2023)   Exercise Vital Sign    Days of Exercise per Week: 3 days    Minutes of Exercise per Session: 30 min  Stress: No Stress Concern Present (11/27/2023)   [employer] of Occupational Health - Occupational Stress Questionnaire    Feeling of Stress : Not at all  Social Connections: Moderately Integrated (11/27/2023)   Social Connection and Isolation Panel [NHANES]    Frequency of Communication with Friends and Family: More than three times a week    Frequency of Social Gatherings with Friends and Family: Once a week    Attends Religious Services: More than 4 times per year    Active Member of [employer] or Organizations: Yes    Attends Engineer, Structural: More than 4 times per year    Marital Status: Never married    Tobacco Counseling Ready to quit: Not Answered Counseling given: Not Answered   Clinical Intake:  Pre-visit preparation completed: Yes  Pain : No/denies pain     BMI - recorded: 24.92 Nutritional Status: BMI of 19-24  Normal Nutritional Risks: None Diabetes: No  How often do you need to have someone help you when you read instructions, pamphlets, or other written materials from your doctor or pharmacy?: 1 - Never  Interpreter Needed?: No  Information entered by :: Zamar Odwyer, RMA   Activities of Daily Living    11/27/2023    4:04 PM 12/17/2022    9:23 AM  In your present state of health, do you have any difficulty performing the following activities:  Hearing? 0 0  Vision? 0 0  Difficulty concentrating or making decisions? 0 0  Walking or climbing stairs? 0 0  Dressing or bathing? 0 0  Doing errands, shopping? 0 0  Preparing Food and  eating ? N N  Using the Toilet? N N  In the past six months, have you accidently leaked urine? Y Y  Do you have problems with loss of bowel control? N N  Managing your Medications? N N  Managing your Finances? N N  Housekeeping or managing your Housekeeping? N N    Patient Care Team: Joshua Debby CROME, MD as PCP - General (Internal Medicine) Melita Drivers, MD as Consulting Physician (Orthopedic Surgery) Lavoie, Marie-Lyne, MD as Consulting Physician (Obstetrics and Gynecology)  Indicate any recent  Medical Services you may have received from other than [facility] providers in the past year (date may be approximate).     Assessment:   This is a routine wellness examination for Sharon Robinson.  Hearing/Vision screen Hearing Screening - Comments:: Denies hearing difficulties   Vision Screening - Comments:: Wears eyeglasses   Goals Addressed             This Visit's Progress    My goal for 2024 is to maintain my health.   On track     Depression Screen    11/28/2023    2:03 PM 12/24/2022    4:04 PM 12/17/2022    9:31 AM 12/15/2021    9:57 AM 12/15/2021    9:52 AM 02/09/2021    1:13 PM 10/05/2020    2:53 PM  PHQ 2/9 Scores  PHQ - 2 Score 3 0 0 0 0 0 0  PHQ- 9 Score 3          Fall Risk    11/27/2023    4:04 PM 12/24/2022    4:04 PM 12/17/2022    9:23 AM 12/13/2022    6:13 PM 12/15/2021    9:55 AM  Fall Risk   Falls in the past year? 0 0 0 0 1  Number falls in past yr: 0 0 0 0 0  Injury with Fall? 0 0 1 0 1  Risk for fall due to :  No Fall Risks No Fall Risks  History of fall(s)  Follow up Falls evaluation completed;Falls prevention discussed Falls evaluation completed Falls evaluation completed  Falls evaluation completed;Education provided    MEDICARE RISK AT HOME: Medicare Risk at Home Any stairs in or around the home?: (Patient-Rptd) Yes If so, are there any without handrails?: (Patient-Rptd) No Home free of loose throw rugs in walkways, pet beds, electrical cords, etc?:  (Patient-Rptd) No Adequate lighting in your home to reduce risk of falls?: (Patient-Rptd) Yes Life alert?: (Patient-Rptd) No Use of a cane, walker or w/c?: (Patient-Rptd) No Grab bars in the bathroom?: (Patient-Rptd) Yes Shower chair or bench in shower?: (Patient-Rptd) Yes Elevated toilet seat or a handicapped toilet?: (Patient-Rptd) Yes  TIMED UP AND GO:  Was the test performed?  Yes  Length of time to ambulate 10 feet: 15 sec Gait slow and steady without use of assistive device    Cognitive Function:    09/01/2018    2:39 PM 08/26/2017    3:43 PM  MMSE - Mini Mental State Exam  Orientation to time 5 5  Orientation to Place 5 5  Registration 3   Attention/ Calculation 5   Recall 2   Language- name 2 objects 2   Language- repeat 1   Language- follow 3 step command 3   Language- read & follow direction 1   Write a sentence 1   Copy design 1   Total score 29         11/28/2023    2:01 PM 12/17/2022    9:28 AM 10/05/2020    2:56 PM  6CIT Screen  What Year? 0 points 0 points 0 points  What month? 0 points 0 points 0 points  What time? 0 points 0 points 0 points  Count back from 20 0 points 0 points 0 points  Months in reverse 0 points 0 points 0 points  Repeat phrase 0 points 0 points 0 points  Total Score 0 points 0 points 0 points    Immunizations Immunization History  Administered Date(s) Administered  Fluad Quad(high Dose 65+) 09/03/2019, 10/05/2020   Hepatitis A, Adult 12/02/2017   Moderna SARS-COV2 Booster Vaccination 09/19/2021   PFIZER(Purple Top)SARS-COV-2 Vaccination 12/20/2019, 01/19/2020   Pneumococcal Conjugate-13 12/25/2016   Pneumococcal Polysaccharide-23 08/22/2017, 09/01/2018   Tdap 05/06/2014   Zoster, Live 05/20/2013    TDAP status: Up to date  Flu Vaccine status: Due, Education has been provided regarding the importance of this vaccine. Advised may receive this vaccine at local pharmacy or Health Dept. Aware to provide a copy of the  vaccination record if obtained from local pharmacy or Health Dept. Verbalized acceptance and understanding.  Pneumococcal vaccine status: Up to date  Covid-19 vaccine status: Information provided on how to obtain vaccines.   Qualifies for Shingles Vaccine? Yes   Zostavax completed Yes   Shingrix  Completed?: No.    Education has been provided regarding the importance of this vaccine. Patient has been advised to call insurance company to determine out of pocket expense if they have not yet received this vaccine. Advised may also receive vaccine at local pharmacy or Health Dept. Verbalized acceptance and understanding.  Screening Tests Health Maintenance  Topic Date Due   Zoster Vaccines- Shingrix  (1 of 2) 03/21/1994   Cervical Cancer Screening (Pap smear)  06/11/2019   INFLUENZA VACCINE  05/23/2023   COVID-19 Vaccine (4 - 2024-25 season) 06/23/2023   DTaP/Tdap/Td (2 - Td or Tdap) 05/06/2024   Medicare Annual Wellness (AWV)  11/27/2024   Pneumonia Vaccine 69+ Years old  Completed   DEXA SCAN  Completed   Hepatitis C Screening  Completed   HPV VACCINES  Aged Out   Colonoscopy  Discontinued    Health Maintenance  Health Maintenance Due  Topic Date Due   Zoster Vaccines- Shingrix  (1 of 2) 03/21/1994   Cervical Cancer Screening (Pap smear)  06/11/2019   INFLUENZA VACCINE  05/23/2023   COVID-19 Vaccine (4 - 2024-25 season) 06/23/2023    Colorectal cancer screening: No longer required.   Mammogram status: Completed 11/21/2022. Repeat every year  Bone Density status: Completed 01/29/2023. Results reflect: Bone density results: OSTEOPENIA. Repeat every 2 years.  Lung Cancer Screening: (Low Dose CT Chest recommended if Age 33-80 years, 20 pack-year currently smoking OR have quit w/in 15years.) does not qualify.   Lung Cancer Screening Referral: N/A  Additional Screening:  Hepatitis C Screening: does qualify; Completed 04/25/2017  Vision Screening: Recommended annual ophthalmology  exams for early detection of glaucoma and other disorders of the eye. Is the patient up to date with their annual eye exam?  Yes  Who is the provider or what is the name of the office in which the patient attends annual eye exams? [facility] If pt is not established with a provider, would they like to be referred to a provider to establish care? No .   Dental Screening: Recommended annual dental exams for proper oral hygiene   Community Resource Referral / Chronic Care Management: CRR required this visit?  No   CCM required this visit?  No     Plan:     I have personally reviewed and noted the following in the patient's chart:   Medical and social history Use of alcohol, tobacco or illicit drugs  Current medications and supplements including opioid prescriptions. Patient is not currently taking opioid prescriptions. Functional ability and status Nutritional status Physical activity Advanced directives List of other physicians Hospitalizations, surgeries, and ER visits in previous 12 months Vitals Screenings to include cognitive, depression, and falls Referrals and appointments  In  addition, I have reviewed and discussed with patient certain preventive protocols, quality metrics, and best practice recommendations. A written personalized care plan for preventive services as well as general preventive health recommendations were provided to patient.     Sharon Robinson Adir Schicker, CMA   11/28/2023   After Visit Summary: (MyChart) Due to this being a telephonic visit, the after visit summary with patients personalized plan was offered to patient via MyChart   Nurse Notes: Patient is up to date with her health maintenance.  Patient stated that she has had a Flu and Covid vaccine, however it is not documented in the [facility].  She has an appointment for a mammogram tomorrow.  Patient did schedule for a follow up visit today. Patient had no concerns to address today.

## 2023-11-29 DIAGNOSIS — Z1231 Encounter for screening mammogram for malignant neoplasm of breast: Secondary | ICD-10-CM | POA: Diagnosis not present

## 2023-11-29 LAB — HM MAMMOGRAPHY

## 2023-12-02 ENCOUNTER — Encounter: Payer: Self-pay | Admitting: Internal Medicine

## 2023-12-25 ENCOUNTER — Telehealth: Payer: Self-pay | Admitting: *Deleted

## 2023-12-25 NOTE — Telephone Encounter (Signed)
Error

## 2024-01-06 ENCOUNTER — Encounter: Payer: Self-pay | Admitting: Obstetrics and Gynecology

## 2024-01-06 ENCOUNTER — Ambulatory Visit (INDEPENDENT_AMBULATORY_CARE_PROVIDER_SITE_OTHER): Payer: Medicare Other | Admitting: Obstetrics and Gynecology

## 2024-01-06 VITALS — BP 110/60 | HR 67 | Ht 63.5 in | Wt 143.0 lb

## 2024-01-06 DIAGNOSIS — N952 Postmenopausal atrophic vaginitis: Secondary | ICD-10-CM

## 2024-01-06 DIAGNOSIS — M810 Age-related osteoporosis without current pathological fracture: Secondary | ICD-10-CM

## 2024-01-06 DIAGNOSIS — Z9189 Other specified personal risk factors, not elsewhere classified: Secondary | ICD-10-CM | POA: Diagnosis not present

## 2024-01-06 DIAGNOSIS — Z9289 Personal history of other medical treatment: Secondary | ICD-10-CM | POA: Diagnosis not present

## 2024-01-06 DIAGNOSIS — B009 Herpesviral infection, unspecified: Secondary | ICD-10-CM

## 2024-01-06 NOTE — Progress Notes (Signed)
 78 y.o. y.o. female here for medicare breast and pelvic exam No LMP recorded. Patient is postmenopausal.    G0 Single.  Retired Runner, broadcasting/film/video.     HPI: Postmenopausal, well on no HRT.  No PMB.  No pelvic pain.  Abstinent.  Last Pap Neg 05/2017. No h/o abnormal Pap.  Urine normal, except from some SUI.  BMs normal.  Breasts normal. Mammo 10/2022, Lt breast Negative 09/2021.  BD Osteopenia T-Score -2.3 at the Rt Femoral Neck 11/2020 repeat 2024 -1.7.  On Fosamax for 2 years. Had a fall recently, no fracture. Will repeat a BD in 11/2024  BMI 23.87 last visit.  Walking and yard work. Health labs with Fam MD. COLONOSCOPY: 2009  Body mass index is 24.93 kg/m.     11/28/2023    2:03 PM 12/24/2022    4:04 PM 12/17/2022    9:31 AM  Depression screen PHQ 2/9  Decreased Interest 0 0 0  Down, Depressed, Hopeless 3 0 0  PHQ - 2 Score 3 0 0  Altered sleeping 0    Tired, decreased energy 0    Change in appetite 0    Feeling bad or failure about yourself  0    Trouble concentrating 0    Moving slowly or fidgety/restless 0    Suicidal thoughts 0    PHQ-9 Score 3    Difficult doing work/chores Not difficult at all      Blood pressure 110/60, pulse 67, height 5' 3.5" (1.613 m), weight 143 lb (64.9 kg), SpO2 98%.  No results found for: "DIAGPAP", "HPVHIGH", "ADEQPAP"  GYN HISTORY: No results found for: "DIAGPAP", "HPVHIGH", "ADEQPAP"  OB History  Gravida Para Term Preterm AB Living  0 0 0 0 0 0  SAB IAB Ectopic Multiple Live Births  0 0 0 0 0    Past Medical History:  Diagnosis Date   Degenerative disc disease, lumbar 08/13/2017   Xray on 08/12/17   DI (detrusor instability)    GERD (gastroesophageal reflux disease)    H/O vitamin D deficiency    Hyperlipidemia    Osteopenia    Sinusitis    related to smoking   STD (sexually transmitted disease)    HSV 2    Past Surgical History:  Procedure Laterality Date   CATARACT EXTRACTION Bilateral 2016   OTHER SURGICAL HISTORY     T&A age 78    SHOULDER INJECTION Left 11/2018   Dr. Rennis Chris   TONSILLECTOMY     TOTAL HIP ARTHROPLASTY Left 08/20/2022   Procedure: LEFT TOTAL HIP ARTHROPLASTY-ANTERIOR APPROACH;  Surgeon: Tarry Kos, MD;  Location: [facility] OR;  Service: Orthopedics;  Laterality: Left;   TUBAL LIGATION  1989    Current Outpatient Medications on File Prior to Visit  Medication Sig Dispense Refill   aspirin EC 81 MG tablet Take 81 mg by mouth daily. Swallow whole.     atorvastatin (LIPITOR) 40 MG tablet Take 1 tablet (40 mg total) by mouth daily. 90 tablet 0   cetirizine (ZYRTEC ALLERGY) 10 MG tablet Take 1 tablet (10 mg total) by mouth daily. 30 tablet 30   Cholecalciferol (VITAMIN D) 50 MCG (2000 UT) tablet Take 2,000 Units by mouth daily.     docusate sodium (COLACE) 50 MG capsule Take 50 mg by mouth 2 (two) times daily.     guaiFENesin (MUCINEX PO) Take by mouth.     valACYclovir (VALTREX) 500 MG tablet TAKE ONE TABLET BY MOUTH DAILY 90 tablet 4  No current facility-administered medications on file prior to visit.    Social History   Socioeconomic History   Marital status: Single    Spouse name: Not on file   Number of children: 0   Years of education: Not on file   Highest education level: Master's degree (e.g., MA, MS, MEng, MEd, MSW, MBA)  Occupational History   Occupation: retired  Tobacco Use   Smoking status: Every Day    Current packs/day: 0.25    Types: Cigarettes   Smokeless tobacco: Never  Vaping Use   Vaping status: Never Used  Substance and Sexual Activity   Alcohol use: Not Currently   Drug use: No   Sexual activity: Not Currently    Birth control/protection: Surgical, Post-menopausal    Comment: BTL,des neg, older than 16, more than 5  Other Topics Concern   Not on file  Social History Narrative   Lives alone-2025   Social Drivers of Health   Financial Resource Strain: Low Risk  (11/27/2023)   Overall Financial Resource Strain (CARDIA)    Difficulty of Paying Living Expenses: Not  hard at all  Food Insecurity: No Food Insecurity (11/27/2023)   Hunger Vital Sign    Worried About Running Out of Food in the Last Year: Never true    Ran Out of Food in the Last Year: Never true  Transportation Needs: No Transportation Needs (11/27/2023)   PRAPARE - Administrator, Civil Service (Medical): No    Lack of Transportation (Non-Medical): No  Physical Activity: Insufficiently Active (11/27/2023)   Exercise Vital Sign    Days of Exercise per Week: 3 days    Minutes of Exercise per Session: 30 min  Stress: No Stress Concern Present (11/27/2023)   [employer] of Occupational Health - Occupational Stress Questionnaire    Feeling of Stress : Not at all  Social Connections: Moderately Integrated (11/27/2023)   Social Connection and Isolation Panel [NHANES]    Frequency of Communication with Friends and Family: More than three times a week    Frequency of Social Gatherings with Friends and Family: Once a week    Attends Religious Services: More than 4 times per year    Active Member of [employer] or Organizations: Yes    Attends Engineer, structural: More than 4 times per year    Marital Status: Never married  Intimate Partner Violence: Patient Unable To Answer (11/28/2023)   Humiliation, Afraid, Rape, and Kick questionnaire    Fear of Current or Ex-Partner: Patient unable to answer    Emotionally Abused: Patient unable to answer    Physically Abused: Patient unable to answer    Sexually Abused: Patient unable to answer    Family History  Problem Relation Age of Onset   Heart attack Mother 43   Cancer Father 46       colon     No Known Allergies    Patient's last menstrual period was No LMP recorded. Patient is postmenopausal..            Review of Systems Alls systems reviewed and are negative.     Physical Exam Genitourinary:     Vulva and urethral meatus normal.     No lesions in the vagina.     Right Labia: No rash, lesions or skin changes.     Left Labia: No lesions, skin changes or rash.    No vaginal discharge or tenderness.     No vaginal prolapse present.  Moderate vaginal atrophy present.     Right Adnexa: not tender, not palpable and no mass present.    Left Adnexa: not tender, not palpable and no mass present.    No cervical motion tenderness or discharge.     Uterus is not enlarged, tender or irregular.  Cardiovascular:     Rate and Rhythm: Normal rate and regular rhythm.  Pulmonary:     Effort: Pulmonary effort is normal.     Breath sounds: Normal breath sounds.  Vitals and nursing note reviewed. Exam conducted with a chaperone present.    Joy, MA present for entire exam    A:         Medicare breast and pelvic exam                             P:        Pap smear not indicated Encouraged annual mammogram screening Colon cancer screening up-to-date DXA up-to-date Labs and immunizations to do with PMD Encouraged healthy lifestyle practices Encouraged Vit D and Calcium   No follow-ups on file.  Earley Favor

## 2024-01-22 DIAGNOSIS — Z9849 Cataract extraction status, unspecified eye: Secondary | ICD-10-CM | POA: Diagnosis not present

## 2024-01-22 DIAGNOSIS — H35373 Puckering of macula, bilateral: Secondary | ICD-10-CM | POA: Diagnosis not present

## 2024-01-22 DIAGNOSIS — H53143 Visual discomfort, bilateral: Secondary | ICD-10-CM | POA: Diagnosis not present

## 2024-01-22 DIAGNOSIS — H04123 Dry eye syndrome of bilateral lacrimal glands: Secondary | ICD-10-CM | POA: Diagnosis not present

## 2024-01-23 ENCOUNTER — Encounter: Payer: Self-pay | Admitting: Internal Medicine

## 2024-01-23 ENCOUNTER — Ambulatory Visit: Payer: Medicare Other | Admitting: Internal Medicine

## 2024-01-23 VITALS — BP 120/56 | HR 84 | Temp 97.9°F | Ht 63.5 in | Wt 143.2 lb

## 2024-01-23 DIAGNOSIS — I491 Atrial premature depolarization: Secondary | ICD-10-CM

## 2024-01-23 DIAGNOSIS — Z136 Encounter for screening for cardiovascular disorders: Secondary | ICD-10-CM | POA: Diagnosis not present

## 2024-01-23 DIAGNOSIS — K219 Gastro-esophageal reflux disease without esophagitis: Secondary | ICD-10-CM

## 2024-01-23 DIAGNOSIS — E7800 Pure hypercholesterolemia, unspecified: Secondary | ICD-10-CM

## 2024-01-23 LAB — HEPATIC FUNCTION PANEL
ALT: 14 U/L (ref 0–35)
AST: 18 U/L (ref 0–37)
Albumin: 4.2 g/dL (ref 3.5–5.2)
Alkaline Phosphatase: 60 U/L (ref 39–117)
Bilirubin, Direct: 0.1 mg/dL (ref 0.0–0.3)
Total Bilirubin: 0.5 mg/dL (ref 0.2–1.2)
Total Protein: 6.9 g/dL (ref 6.0–8.3)

## 2024-01-23 LAB — CBC WITH DIFFERENTIAL/PLATELET
Basophils Absolute: 0.1 10*3/uL (ref 0.0–0.1)
Basophils Relative: 0.9 % (ref 0.0–3.0)
Eosinophils Absolute: 0.1 10*3/uL (ref 0.0–0.7)
Eosinophils Relative: 0.7 % (ref 0.0–5.0)
HCT: 39 % (ref 36.0–46.0)
Hemoglobin: 13.1 g/dL (ref 12.0–15.0)
Lymphocytes Relative: 26.3 % (ref 12.0–46.0)
Lymphs Abs: 1.9 10*3/uL (ref 0.7–4.0)
MCHC: 33.5 g/dL (ref 30.0–36.0)
MCV: 106.2 fl — ABNORMAL HIGH (ref 78.0–100.0)
Monocytes Absolute: 0.5 10*3/uL (ref 0.1–1.0)
Monocytes Relative: 6.6 % (ref 3.0–12.0)
Neutro Abs: 4.7 10*3/uL (ref 1.4–7.7)
Neutrophils Relative %: 65.5 % (ref 43.0–77.0)
Platelets: 287 10*3/uL (ref 150.0–400.0)
RBC: 3.67 Mil/uL — ABNORMAL LOW (ref 3.87–5.11)
RDW: 13.1 % (ref 11.5–15.5)
WBC: 7.2 10*3/uL (ref 4.0–10.5)

## 2024-01-23 LAB — BASIC METABOLIC PANEL WITH GFR
BUN: 24 mg/dL — ABNORMAL HIGH (ref 6–23)
CO2: 31 meq/L (ref 19–32)
Calcium: 9.7 mg/dL (ref 8.4–10.5)
Chloride: 105 meq/L (ref 96–112)
Creatinine, Ser: 0.85 mg/dL (ref 0.40–1.20)
GFR: 64.97 mL/min (ref >60.00)
Glucose, Bld: 112 mg/dL — ABNORMAL HIGH (ref 70–99)
Potassium: 4.7 meq/L (ref 3.5–5.1)
Sodium: 141 meq/L (ref 135–145)

## 2024-01-23 LAB — TSH: TSH: 0.89 u[IU]/mL (ref 0.35–5.50)
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
## 2024-01-23 NOTE — Patient Instructions (Signed)

## 2024-02-20 ENCOUNTER — Other Ambulatory Visit: Payer: Self-pay

## 2024-02-20 ENCOUNTER — Telehealth: Payer: Self-pay | Admitting: Orthopaedic Surgery

## 2024-02-20 MED ORDER — AMOXICILLIN 500 MG PO TABS: ORAL_TABLET | ORAL | 2 refills | Status: AC

## 2024-02-20 NOTE — Telephone Encounter (Signed)
 Notified patient.

## 2024-02-20 NOTE — Telephone Encounter (Signed)
 Pt called need a refill of Amoxicillian fr upcoming dental appt. Pt appt 6/9 Please send to [facility] Pt phone number is [phone].

## 2024-06-29 ENCOUNTER — Other Ambulatory Visit: Payer: Self-pay

## 2024-06-29 ENCOUNTER — Other Ambulatory Visit: Payer: Self-pay | Admitting: Internal Medicine

## 2024-06-29 DIAGNOSIS — E7800 Pure hypercholesterolemia, unspecified: Secondary | ICD-10-CM

## 2024-06-29 NOTE — Telephone Encounter (Signed)
.  Med refill request: Valtrex   Last AEX:01/02/23 Next AEX:01/07/25 Last MMG (if hormonal med) Refill authorized: Please Advise?

## 2024-06-30 MED ORDER — VALACYCLOVIR HCL 500 MG PO TABS: 500.0000 mg | ORAL_TABLET | Freq: Every day | ORAL | 4 refills | Status: AC

## 2024-07-20 ENCOUNTER — Encounter: Payer: Self-pay | Admitting: Internal Medicine

## 2024-07-20 NOTE — Progress Notes (Signed)
 Pharmacy Quality Measure Review  This patient is appearing on a report for being at risk of failing the adherence measure for cholesterol (statin) medications this calendar year.   Medication: Atorvastatin  40mg  Last fill date: 09/09 for 90 day supply  Insurance report was not up to date. No action needed at this time.    Angela Baalmann, PharmD [facility] Pharmacist

## 2024-08-24 ENCOUNTER — Encounter: Payer: Self-pay | Admitting: Radiology

## 2024-08-25 ENCOUNTER — Other Ambulatory Visit

## 2024-08-25 ENCOUNTER — Ambulatory Visit: Payer: Medicare Other | Admitting: Orthopaedic Surgery

## 2024-08-25 ENCOUNTER — Other Ambulatory Visit (INDEPENDENT_AMBULATORY_CARE_PROVIDER_SITE_OTHER)

## 2024-08-25 DIAGNOSIS — Z96642 Presence of left artificial hip joint: Secondary | ICD-10-CM

## 2024-08-25 DIAGNOSIS — M1711 Unilateral primary osteoarthritis, right knee: Secondary | ICD-10-CM

## 2024-08-25 NOTE — Progress Notes (Signed)
 Office Visit Note   Patient: Sharon Robinson           Date of Birth: [birth year]           MRN: [id number] Visit Date: 08/25/2024              Requested by: Joshua Debby CROME, MD [address] [place],  [place] [address] PCP: Joshua Debby CROME, MD   Assessment & Plan: Visit Diagnoses:  1. History of total hip replacement, left   2. Primary osteoarthritis of right knee     Plan: History of Present Illness Sharon Robinson is an 78 year old female who presents for a follow-up of her left hip replacement and evaluation of right knee pain.  Two years post left hip replacement, she is satisfied with the outcome. Occasional muscle sensations around the hip are present but not bothersome. The hip functions well for daily activities without major issues.  Right knee pain has been present for a few months, localized to the medial aspect, and is intermittent without radiation. There is no history of recent trauma or falls, and no clicking, popping, or catching sensations. She finds some relief using a Lazy Boy chair at night.  Exam of the left hip shows fully healed surgical scar.  Fluid painless range of motion. Exam of right knee show no joint effusion.  Normal ROM.  No JLT.   Assessment and Plan Status post left total hip arthroplasty Two years post-surgery with good function, no significant issues. Occasional muscle sensations  consistent with post surgical changes. X-rays show well-ingrown components. - Discharged from hip follow-up.  Right knee primary osteoarthritis Intermittent medial knee pain with bone spurs and arthritis on X-ray. No severe symptoms warranting intervention. - Advised to return if pain becomes constant or frequent for potential injection or medication.  Follow-Up Instructions: No follow-ups on file.   Orders:  Orders Placed This Encounter  Procedures   XR Pelvis 1-2 Views   XR KNEE 3 VIEW RIGHT   No orders of the defined types were placed in this  encounter.     Procedures: No procedures performed   Clinical Data: No additional findings.   Subjective: Chief Complaint  Patient presents with   Left Hip - Pain   Right Knee - Pain    HPI  Review of Systems  Constitutional: Negative.   HENT: Negative.    Eyes: Negative.   Respiratory: Negative.    Cardiovascular: Negative.   Endocrine: Negative.   Musculoskeletal: Negative.   Neurological: Negative.   Hematological: Negative.   Psychiatric/Behavioral: Negative.    All other systems reviewed and are negative.    Objective: Vital Signs: There were no vitals taken for this visit.  Physical Exam Vitals and nursing note reviewed.  Constitutional:      Appearance: She is well-developed.  HENT:     Head: Atraumatic.     Nose: Nose normal.  Eyes:     Extraocular Movements: Extraocular movements intact.  Cardiovascular:     Pulses: Normal pulses.  Pulmonary:     Effort: Pulmonary effort is normal.  Abdominal:     Palpations: Abdomen is soft.  Musculoskeletal:     Cervical back: Neck supple.  Skin:    General: Skin is warm.     Capillary Refill: Capillary refill takes less than 2 seconds.  Neurological:     Mental Status: She is alert. Mental status is at baseline.  Psychiatric:  Behavior: Behavior normal.        Thought Content: Thought content normal.        Judgment: Judgment normal.     Ortho Exam  Specialty Comments:  No specialty comments available.  Imaging: XR KNEE 3 VIEW RIGHT Result Date: 08/25/2024 X-rays of the right knee show minimal OA.  XR Pelvis 1-2 Views Result Date: 08/25/2024 Stable left total hip replacement without complications.  No degenerative changes of the right hip.    PMFS History: Patient Active Problem List   Diagnosis Date Noted   Screening for ischemic heart disease 01/23/2024   PAC (premature atrial contraction) 01/23/2024   Need for prophylactic vaccination and inoculation against varicella  04/04/2022   Primary osteoarthritis of left hip 04/04/2022   Encounter for general adult medical examination with abnormal findings 04/03/2022   Vitamin D  deficiency disease 04/03/2022   Degenerative disc disease, lumbar 08/13/2017   OAB (overactive bladder) 04/16/2013   Tobacco abuse 08/26/2012   DI (detrusor instability)    Osteopenia    Hypercholesterolemia 08/20/2011   GERD (gastroesophageal reflux disease)    Past Medical History:  Diagnosis Date   Degenerative disc disease, lumbar 08/13/2017   Xray on 08/12/17   DI (detrusor instability)    GERD (gastroesophageal reflux disease)    H/O vitamin D  deficiency    Hyperlipidemia    Osteopenia    Sinusitis    related to smoking   STD (sexually transmitted disease)    HSV 2    Family History  Problem Relation Age of Onset   Heart attack Mother 76   Cancer Father 30       colon    Past Surgical History:  Procedure Laterality Date   CATARACT EXTRACTION Bilateral 2016   OTHER SURGICAL HISTORY     T&A age 3   SHOULDER INJECTION Left 11/2018   Dr. Melita   TONSILLECTOMY     TOTAL HIP ARTHROPLASTY Left 08/20/2022   Procedure: LEFT TOTAL HIP ARTHROPLASTY-ANTERIOR APPROACH;  Surgeon: Jerri Kay HERO, MD;  Location: [facility] OR;  Service: Orthopedics;  Laterality: Left;   TUBAL LIGATION  1989   Social History   Occupational History   Occupation: retired  Tobacco Use   Smoking status: Every Day    Current packs/day: 0.25    Types: Cigarettes   Smokeless tobacco: Never  Vaping Use   Vaping status: Never Used  Substance and Sexual Activity   Alcohol use: Not Currently   Drug use: No   Sexual activity: Not Currently    Birth control/protection: Surgical, Post-menopausal    Comment: BTL,des neg, older than 16, more than 5

## 2024-09-16 ENCOUNTER — Ambulatory Visit: Admitting: Internal Medicine

## 2024-09-16 VITALS — BP 128/70 | HR 65 | Temp 97.7°F | Ht 63.5 in | Wt 153.4 lb

## 2024-09-16 DIAGNOSIS — Z0001 Encounter for general adult medical examination with abnormal findings: Secondary | ICD-10-CM

## 2024-09-16 DIAGNOSIS — E7800 Pure hypercholesterolemia, unspecified: Secondary | ICD-10-CM | POA: Diagnosis not present

## 2024-09-16 DIAGNOSIS — Z23 Encounter for immunization: Secondary | ICD-10-CM

## 2024-09-16 DIAGNOSIS — K219 Gastro-esophageal reflux disease without esophagitis: Secondary | ICD-10-CM | POA: Diagnosis not present

## 2024-09-16 DIAGNOSIS — Z0000 Encounter for general adult medical examination without abnormal findings: Secondary | ICD-10-CM

## 2024-09-16 LAB — CBC WITH DIFFERENTIAL/PLATELET
Basophils Absolute: 0.1 K/uL (ref 0.0–0.1)
Basophils Relative: 1 % (ref 0.0–3.0)
Eosinophils Absolute: 0.1 K/uL (ref 0.0–0.7)
Eosinophils Relative: 1 % (ref 0.0–5.0)
HCT: 37.7 % (ref 36.0–46.0)
Hemoglobin: 12.7 g/dL (ref 12.0–15.0)
Lymphocytes Relative: 33.9 % (ref 12.0–46.0)
Lymphs Abs: 2.5 K/uL (ref 0.7–4.0)
MCHC: 33.8 g/dL (ref 30.0–36.0)
MCV: 104 fl — ABNORMAL HIGH (ref 78.0–100.0)
Monocytes Absolute: 0.7 K/uL (ref 0.1–1.0)
Monocytes Relative: 8.8 % (ref 3.0–12.0)
Neutro Abs: 4.1 K/uL (ref 1.4–7.7)
Neutrophils Relative %: 55.3 % (ref 43.0–77.0)
Platelets: 283 K/uL (ref 150.0–400.0)
RBC: 3.63 Mil/uL — ABNORMAL LOW (ref 3.87–5.11)
RDW: 13.2 % (ref 11.5–15.5)
WBC: 7.4 K/uL (ref 4.0–10.5)

## 2024-09-16 LAB — LIPID PANEL
Cholesterol: 139 mg/dL (ref 0–200)
HDL: 55.7 mg/dL (ref >39.00)
LDL Cholesterol: 64 mg/dL (ref 0–99)
NonHDL: 83.63
Total CHOL/HDL Ratio: 3
Triglycerides: 98 mg/dL (ref 0.0–149.0)
VLDL: 19.6 mg/dL (ref 0.0–40.0)

## 2024-09-16 LAB — BASIC METABOLIC PANEL WITH GFR
BUN: 22 mg/dL (ref 6–23)
CO2: 30 meq/L (ref 19–32)
Calcium: 9.9 mg/dL (ref 8.4–10.5)
Chloride: 104 meq/L (ref 96–112)
Creatinine, Ser: 0.83 mg/dL (ref 0.40–1.20)
GFR: 66.55 mL/min (ref >60.00)
Glucose, Bld: 95 mg/dL (ref 70–99)
Potassium: 4.1 meq/L (ref 3.5–5.1)
Sodium: 140 meq/L (ref 135–145)
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
## 2024-09-16 NOTE — Patient Instructions (Signed)

## 2024-09-18 ENCOUNTER — Other Ambulatory Visit: Payer: Self-pay | Admitting: Internal Medicine

## 2024-09-18 DIAGNOSIS — E7800 Pure hypercholesterolemia, unspecified: Secondary | ICD-10-CM

## 2024-09-21 ENCOUNTER — Ambulatory Visit: Payer: Self-pay | Admitting: Internal Medicine

## 2024-10-06 NOTE — Progress Notes (Signed)
 Pharmacy Quality Measure Review  This patient is appearing on a report for being at risk of failing the adherence measure for cholesterol (statin) medications this calendar year.   Medication: atorvastatin  40 mg Last fill date: 06/30/24 for 90 day supply  Contacted patient to notify her that [facility] has a new prescription for atorvastatin  40 mg. She said that she would call them to refill prescription and pick it up tomorrow.   Jenkins Graces, PharmD PGY1 Pharmacy Resident [phone]

## 2024-11-24 ENCOUNTER — Ambulatory Visit: Payer: Self-pay | Admitting: *Deleted

## 2024-11-24 NOTE — Telephone Encounter (Signed)
 FYI Only or Action Required?: Action required by provider: Appointment question-see notes.  Patient was last seen in primary care on 09/16/2024 by Joshua Debby CROME, MD.  Called Nurse Triage reporting Knee Pain.  Symptoms began several months ago.  Interventions attempted: OTC medications: ibuprofen .  Symptoms are: gradually worsening.  Triage Disposition: See PCP Within 2 Weeks  Patient/caregiver understands and will follow disposition?: Yes    Message from Antwanette L sent at 11/24/2024  1:54 PM EST  Reason for Triage: The pt is having right knee pain and needs to r/s her AWV appt on 2/9 due to the weather   Reason for Disposition  Knee pain is a chronic symptom (recurrent or ongoing AND present > 4 weeks)  Answer Assessment - Initial Assessment Questions Patient is calling to reschedule her AWV- she is wondering if this visit- if only with the nurse- can be done virtual- if not she would like to reschedule.   Patient has knee pain- but she sees specialist for that and has upcoming appointment- she will call if she needs follow up on her knee.      1. LOCATION and RADIATION: Where is the pain located?      R knee- patient sees Dr Jerri and has upcoming appointment   3. SEVERITY: How bad is the pain? What does it keep you from doing?   (Scale 1-10; or mild, moderate, severe)     Patient using ibuprofen  as needed, 5/10 4. ONSET: When did the pain start? Does it come and go, or is it there all the time?     Chronic pain  Protocols used: Knee Pain-A-AH

## 2024-11-25 NOTE — Telephone Encounter (Signed)
 Hey! Can you get this patient rescheduled ? I do not know how to schedule on your schedule and I don't want to mess anything up

## 2024-11-30 ENCOUNTER — Ambulatory Visit: Payer: Medicare Other

## 2024-12-15 ENCOUNTER — Ambulatory Visit

## 2025-01-07 ENCOUNTER — Encounter: Admitting: Obstetrics and Gynecology
